# Patient Record
Sex: Female | Born: 1935 | Race: White | Hispanic: No | Smoking: Never smoker
Health system: Southern US, Community
[De-identification: ages and names within clinical notes are randomized; demographics above are authoritative.]

## PROBLEM LIST (undated history)

## (undated) DIAGNOSIS — F419 Anxiety disorder, unspecified: Secondary | ICD-10-CM

## (undated) DIAGNOSIS — U071 COVID-19: Secondary | ICD-10-CM

## (undated) DIAGNOSIS — E079 Disorder of thyroid, unspecified: Secondary | ICD-10-CM

## (undated) DIAGNOSIS — E785 Hyperlipidemia, unspecified: Secondary | ICD-10-CM

## (undated) DIAGNOSIS — N390 Urinary tract infection, site not specified: Secondary | ICD-10-CM

## (undated) DIAGNOSIS — Z87898 Personal history of other specified conditions: Secondary | ICD-10-CM

## (undated) DIAGNOSIS — T7840XA Allergy, unspecified, initial encounter: Secondary | ICD-10-CM

## (undated) DIAGNOSIS — Z8639 Personal history of other endocrine, nutritional and metabolic disease: Secondary | ICD-10-CM

## (undated) DIAGNOSIS — E039 Hypothyroidism, unspecified: Secondary | ICD-10-CM

## (undated) DIAGNOSIS — K635 Polyp of colon: Secondary | ICD-10-CM

## (undated) DIAGNOSIS — M1990 Unspecified osteoarthritis, unspecified site: Secondary | ICD-10-CM

## (undated) DIAGNOSIS — F32A Depression, unspecified: Secondary | ICD-10-CM

## (undated) DIAGNOSIS — G4700 Insomnia, unspecified: Secondary | ICD-10-CM

## (undated) DIAGNOSIS — Z8719 Personal history of other diseases of the digestive system: Secondary | ICD-10-CM

## (undated) DIAGNOSIS — Z8742 Personal history of other diseases of the female genital tract: Secondary | ICD-10-CM

## (undated) DIAGNOSIS — M8580 Other specified disorders of bone density and structure, unspecified site: Secondary | ICD-10-CM

## (undated) DIAGNOSIS — M069 Rheumatoid arthritis, unspecified: Secondary | ICD-10-CM

## (undated) DIAGNOSIS — E538 Deficiency of other specified B group vitamins: Secondary | ICD-10-CM

## (undated) DIAGNOSIS — M722 Plantar fascial fibromatosis: Secondary | ICD-10-CM

## (undated) DIAGNOSIS — F329 Major depressive disorder, single episode, unspecified: Secondary | ICD-10-CM

## (undated) HISTORY — DX: Rheumatoid arthritis, unspecified: M06.9

## (undated) HISTORY — DX: COVID-19: U07.1

## (undated) HISTORY — DX: Disorder of thyroid, unspecified: E07.9

## (undated) HISTORY — DX: Urinary tract infection, site not specified: N39.0

## (undated) HISTORY — DX: Allergy, unspecified, initial encounter: T78.40XA

## (undated) HISTORY — DX: Polyp of colon: K63.5

## (undated) HISTORY — DX: Unspecified osteoarthritis, unspecified site: M19.90

---
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
## 1973-12-06 HISTORY — PX: ABDOMINAL HYSTERECTOMY: SHX81

## 2004-12-06 HISTORY — PX: COLECTOMY: SHX59

## 2005-01-28 ENCOUNTER — Ambulatory Visit: Payer: Self-pay | Admitting: Internal Medicine

## 2005-02-17 ENCOUNTER — Ambulatory Visit: Payer: Self-pay

## 2005-04-19 ENCOUNTER — Inpatient Hospital Stay: Payer: Self-pay | Admitting: Surgery

## 2006-02-22 ENCOUNTER — Ambulatory Visit: Payer: Self-pay | Admitting: Internal Medicine

## 2006-12-06 DIAGNOSIS — K5730 Diverticulosis of large intestine without perforation or abscess without bleeding: Secondary | ICD-10-CM

## 2006-12-06 HISTORY — DX: Diverticulosis of large intestine without perforation or abscess without bleeding: K57.30

## 2007-02-27 ENCOUNTER — Ambulatory Visit: Payer: Self-pay | Admitting: Internal Medicine

## 2007-05-17 ENCOUNTER — Ambulatory Visit: Payer: Self-pay | Admitting: Internal Medicine

## 2007-06-26 ENCOUNTER — Ambulatory Visit: Payer: Self-pay | Admitting: Surgery

## 2007-06-27 ENCOUNTER — Ambulatory Visit: Payer: Self-pay | Admitting: Surgery

## 2007-12-07 HISTORY — PX: CATARACT EXTRACTION: SUR2

## 2008-03-22 ENCOUNTER — Ambulatory Visit: Payer: Self-pay | Admitting: Internal Medicine

## 2009-12-06 HISTORY — PX: KNEE ARTHROSCOPY: SUR90

## 2009-12-06 HISTORY — PX: REPLACEMENT TOTAL KNEE: SUR1224

## 2010-02-09 ENCOUNTER — Inpatient Hospital Stay (HOSPITAL_COMMUNITY): Admission: RE | Admit: 2010-02-09 | Discharge: 2010-02-11 | Payer: Self-pay | Admitting: Orthopedic Surgery

## 2010-03-04 ENCOUNTER — Encounter: Payer: Self-pay | Admitting: Orthopedic Surgery

## 2010-03-06 ENCOUNTER — Encounter: Payer: Self-pay | Admitting: Orthopedic Surgery

## 2010-04-05 ENCOUNTER — Encounter: Payer: Self-pay | Admitting: Orthopedic Surgery

## 2010-05-06 ENCOUNTER — Encounter: Payer: Self-pay | Admitting: Orthopedic Surgery

## 2011-03-01 LAB — URINALYSIS, ROUTINE W REFLEX MICROSCOPIC
Bilirubin Urine: NEGATIVE
Hgb urine dipstick: NEGATIVE
Protein, ur: NEGATIVE mg/dL
Urobilinogen, UA: 0.2 mg/dL (ref 0.0–1.0)

## 2011-03-01 LAB — URINE CULTURE

## 2011-03-01 LAB — CROSSMATCH: Antibody Screen: NEGATIVE

## 2011-03-01 LAB — BASIC METABOLIC PANEL
CO2: 29 mEq/L (ref 19–32)
Calcium: 8.1 mg/dL — ABNORMAL LOW (ref 8.4–10.5)
GFR calc Af Amer: >60 mL/min (ref >60)
GFR calc Af Amer: >60 mL/min (ref >60)
GFR calc non Af Amer: >60 mL/min (ref >60)
GFR calc non Af Amer: >60 mL/min (ref >60)
Glucose, Bld: 107 mg/dL — ABNORMAL HIGH (ref 70–99)
Potassium: 3.8 mEq/L (ref 3.5–5.1)
Sodium: 139 mEq/L (ref 135–145)

## 2011-03-01 LAB — DIFFERENTIAL
Basophils Absolute: 0 10*3/uL (ref 0.0–0.1)
Basophils Relative: 0 % (ref 0–1)
Eosinophils Relative: 4 % (ref 0–5)
Lymphs Abs: 1.7 10*3/uL (ref 0.7–4.0)

## 2011-03-01 LAB — COMPREHENSIVE METABOLIC PANEL
Alkaline Phosphatase: 61 U/L (ref 39–117)
Chloride: 105 mEq/L (ref 96–112)
Creatinine, Ser: 0.86 mg/dL (ref 0.4–1.2)
GFR calc Af Amer: >60 mL/min (ref >60)
Glucose, Bld: 93 mg/dL (ref 70–99)
Potassium: 4.6 mEq/L (ref 3.5–5.1)

## 2011-03-01 LAB — APTT: aPTT: 29 seconds (ref 24–37)

## 2011-03-01 LAB — CBC
HCT: 28.4 % — ABNORMAL LOW (ref 36.0–46.0)
HCT: 42 % (ref 36.0–46.0)
Hemoglobin: 14.6 g/dL (ref 12.0–15.0)
Hemoglobin: 9.8 g/dL — ABNORMAL LOW (ref 12.0–15.0)
MCHC: 34.2 g/dL (ref 30.0–36.0)
MCHC: 34.7 g/dL (ref 30.0–36.0)
Platelets: 245 10*3/uL (ref 150–400)
RBC: 3.11 MIL/uL — ABNORMAL LOW (ref 3.87–5.11)
RDW: 12.3 % (ref 11.5–15.5)

## 2011-03-01 LAB — PROTIME-INR
INR: 0.96 (ref 0.00–1.49)
Prothrombin Time: 12.7 seconds (ref 11.6–15.2)

## 2011-03-01 LAB — ABO/RH: ABO/RH(D): B POS

## 2011-03-01 LAB — URINE MICROSCOPIC-ADD ON

## 2011-03-26 ENCOUNTER — Encounter (HOSPITAL_COMMUNITY)
Admission: RE | Admit: 2011-03-26 | Discharge: 2011-03-26 | Disposition: A | Discharge status: Home / Self Care | Payer: Medicare Other | Source: Ambulatory Visit | Attending: Orthopedic Surgery | Admitting: Orthopedic Surgery

## 2011-03-26 ENCOUNTER — Ambulatory Visit (HOSPITAL_COMMUNITY)
Admission: RE | Admit: 2011-03-26 | Discharge: 2011-03-26 | Disposition: A | Discharge status: Home / Self Care | Payer: Medicare Other | Source: Ambulatory Visit | Attending: Orthopedic Surgery | Admitting: Orthopedic Surgery

## 2011-03-26 ENCOUNTER — Other Ambulatory Visit (HOSPITAL_COMMUNITY): Payer: Self-pay | Admitting: Orthopedic Surgery

## 2011-03-26 DIAGNOSIS — Z01810 Encounter for preprocedural cardiovascular examination: Secondary | ICD-10-CM | POA: Insufficient documentation

## 2011-03-26 DIAGNOSIS — Z01818 Encounter for other preprocedural examination: Secondary | ICD-10-CM | POA: Insufficient documentation

## 2011-03-26 DIAGNOSIS — M1711 Unilateral primary osteoarthritis, right knee: Secondary | ICD-10-CM

## 2011-03-26 DIAGNOSIS — IMO0002 Reserved for concepts with insufficient information to code with codable children: Secondary | ICD-10-CM | POA: Insufficient documentation

## 2011-03-26 DIAGNOSIS — Z01812 Encounter for preprocedural laboratory examination: Secondary | ICD-10-CM | POA: Insufficient documentation

## 2011-03-26 DIAGNOSIS — M1710 Unilateral primary osteoarthritis, unspecified knee: Secondary | ICD-10-CM | POA: Insufficient documentation

## 2011-03-26 LAB — URINALYSIS, ROUTINE W REFLEX MICROSCOPIC
Bilirubin Urine: NEGATIVE
Ketones, ur: NEGATIVE mg/dL
Leukocytes, UA: NEGATIVE
Protein, ur: NEGATIVE mg/dL
Specific Gravity, Urine: 1.016 (ref 1.005–1.030)

## 2011-03-26 LAB — COMPREHENSIVE METABOLIC PANEL
ALT: 40 U/L — ABNORMAL HIGH (ref 0–35)
AST: 37 U/L (ref 0–37)
CO2: 29 mEq/L (ref 19–32)
Calcium: 9.8 mg/dL (ref 8.4–10.5)
Creatinine, Ser: 0.91 mg/dL (ref 0.4–1.2)
GFR calc Af Amer: >60 mL/min (ref >60)
Glucose, Bld: 100 mg/dL — ABNORMAL HIGH (ref 70–99)
Potassium: 4.7 mEq/L (ref 3.5–5.1)
Sodium: 139 mEq/L (ref 135–145)
Total Bilirubin: 0.6 mg/dL (ref 0.3–1.2)

## 2011-03-26 LAB — DIFFERENTIAL
Basophils Absolute: 0 10*3/uL (ref 0.0–0.1)
Eosinophils Absolute: 0.1 10*3/uL (ref 0.0–0.7)
Lymphocytes Relative: 32 % (ref 12–46)
Monocytes Absolute: 0.4 10*3/uL (ref 0.1–1.0)
Monocytes Relative: 8 % (ref 3–12)
Neutro Abs: 2.7 10*3/uL (ref 1.7–7.7)

## 2011-03-26 LAB — URINE MICROSCOPIC-ADD ON

## 2011-03-26 LAB — APTT: aPTT: 24 seconds (ref 24–37)

## 2011-03-26 LAB — CBC
HCT: 38.5 % (ref 36.0–46.0)
MCH: 29.3 pg (ref 26.0–34.0)
RBC: 4.5 MIL/uL (ref 3.87–5.11)
WBC: 4.8 10*3/uL (ref 4.0–10.5)

## 2011-03-26 LAB — SURGICAL PCR SCREEN: Staphylococcus aureus: POSITIVE — AB

## 2011-03-27 LAB — URINE CULTURE: Culture: NO GROWTH

## 2011-03-29 ENCOUNTER — Inpatient Hospital Stay (HOSPITAL_COMMUNITY)
Admission: RE | Admit: 2011-03-29 | Discharge: 2011-03-31 | DRG: 488 | Disposition: A | Discharge status: Home Health | Payer: Medicare Other | Source: Ambulatory Visit | Attending: Orthopedic Surgery | Admitting: Orthopedic Surgery

## 2011-03-29 DIAGNOSIS — Y831 Surgical operation with implant of artificial internal device as the cause of abnormal reaction of the patient, or of later complication, without mention of misadventure at the time of the procedure: Secondary | ICD-10-CM | POA: Diagnosis present

## 2011-03-29 DIAGNOSIS — Z79899 Other long term (current) drug therapy: Secondary | ICD-10-CM

## 2011-03-29 DIAGNOSIS — M12269 Villonodular synovitis (pigmented), unspecified knee: Secondary | ICD-10-CM | POA: Diagnosis present

## 2011-03-29 DIAGNOSIS — D62 Acute posthemorrhagic anemia: Secondary | ICD-10-CM | POA: Diagnosis not present

## 2011-03-29 DIAGNOSIS — Z96659 Presence of unspecified artificial knee joint: Secondary | ICD-10-CM

## 2011-03-29 DIAGNOSIS — T8489XA Other specified complication of internal orthopedic prosthetic devices, implants and grafts, initial encounter: Principal | ICD-10-CM | POA: Diagnosis present

## 2011-03-29 DIAGNOSIS — M25069 Hemarthrosis, unspecified knee: Secondary | ICD-10-CM | POA: Diagnosis present

## 2011-03-30 LAB — CBC
HCT: 31 % — ABNORMAL LOW (ref 36.0–46.0)
Hemoglobin: 10.4 g/dL — ABNORMAL LOW (ref 12.0–15.0)
MCH: 29.5 pg (ref 26.0–34.0)
MCHC: 33.5 g/dL (ref 30.0–36.0)

## 2011-03-30 LAB — BASIC METABOLIC PANEL
BUN: 7 mg/dL (ref 6–23)
CO2: 28 mEq/L (ref 19–32)
Calcium: 7.9 mg/dL — ABNORMAL LOW (ref 8.4–10.5)
Creatinine, Ser: 0.8 mg/dL (ref 0.4–1.2)
Glucose, Bld: 105 mg/dL — ABNORMAL HIGH (ref 70–99)

## 2011-03-31 LAB — BASIC METABOLIC PANEL
CO2: 26 mEq/L (ref 19–32)
Calcium: 8.3 mg/dL — ABNORMAL LOW (ref 8.4–10.5)
Creatinine, Ser: 0.73 mg/dL (ref 0.4–1.2)
GFR calc Af Amer: >60 mL/min (ref >60)

## 2011-03-31 LAB — CBC
MCH: 29.6 pg (ref 26.0–34.0)
MCHC: 33.9 g/dL (ref 30.0–36.0)
Platelets: 197 10*3/uL (ref 150–400)

## 2011-03-31 NOTE — Op Note (Signed)
  NAME:  PULLEYMillie, Forde              ACCOUNT NO.:  [id number]  MEDICAL RECORD NO.:  [id number]           PATIENT TYPE:  LOCATION:                                 FACILITY:  PHYSICIAN:  Mila Homer. Sherlean Foot, M.D. DATE OF BIRTH:  Feb 10, 1936  DATE OF PROCEDURE: DATE OF DISCHARGE:                              OPERATIVE REPORT   PREOPERATIVE DIAGNOSIS:  Pigmented villonodular synovitis and recurrent hemarthrosis.  POSTOPERATIVE DIAGNOSIS:  Pigmented villonodular synovitis and recurrent hemarthrosis.  PROCEDURE:  Right knee revision (one side) with complete open synovectomy.  INDICATIONS FOR PROCEDURE:  The patient is a 75 year old who is approximately one year out from a total knee replacement.  He had recurrent effusions, so I did an  arthroscopic biopsy of the synovium confirming PVNS.  Consent was obtained for open synovectomy.  DESCRIPTION OF PROCEDURE:  The patient was laid supine, administered general anesthesia.  Right leg was prepped and draped in the usual sterile fashion.  Extremity was exsanguinated with the Esmarch and tourniquet inflated to 350 mmHg and set for an hour.  A midline incision was made with a #10 blade.  New blade was used to make a median parapatellar arthrotomy and performed a synovectomy.  I did this synovectomy with multiple clean #10 blade and all the way to the far recess of the knee, curetted out behind the knee, and at that point it looked like blood stained tissue that was pathologic.  I had removed the polyethylene to get to the back of the knee.  I then irrigated it copiously, I let the tourniquet down, obtained hemostasis.  I did put in some thrombin gel spray just to coat the back and the synovium or raw surfaces and the tourniquet down.  I closed with figure-of-eight #1 Vicryl sutures, buried 0 Vicryl sutures, subcuticular 2-0 Vicryl stitch, and skin staples.  COMPLICATIONS:  None.  DRAINS:  1 Hemovac.  ESTIMATED BLOOD LOSS:  300  mL.          ______________________________ Mila Homer. Sherlean Foot, M.D.     SDL/MEDQ  D:  03/29/2011  T:  03/29/2011  Job:  [id number]  Electronically Signed by Georgena Spurling M.D. on 03/31/2011 09:27:04 AM

## 2011-04-01 LAB — CROSSMATCH

## 2011-04-20 ENCOUNTER — Encounter: Payer: Self-pay | Admitting: Orthopedic Surgery

## 2011-05-07 ENCOUNTER — Encounter: Payer: Self-pay | Admitting: Orthopedic Surgery

## 2011-06-06 ENCOUNTER — Encounter: Payer: Self-pay | Admitting: Orthopedic Surgery

## 2012-12-06 DIAGNOSIS — Z8619 Personal history of other infectious and parasitic diseases: Secondary | ICD-10-CM

## 2012-12-06 HISTORY — DX: Personal history of other infectious and parasitic diseases: Z86.19

## 2013-01-02 ENCOUNTER — Ambulatory Visit: Payer: Self-pay | Admitting: Gastroenterology

## 2013-02-06 ENCOUNTER — Other Ambulatory Visit: Payer: Self-pay

## 2013-02-12 LAB — CULTURE, BLOOD (SINGLE)

## 2013-12-06 HISTORY — PX: COLONOSCOPY: SHX174

## 2014-10-18 ENCOUNTER — Ambulatory Visit: Payer: Self-pay | Admitting: Internal Medicine

## 2014-11-14 ENCOUNTER — Encounter: Payer: Self-pay | Admitting: Family Medicine

## 2014-11-14 ENCOUNTER — Ambulatory Visit (INDEPENDENT_AMBULATORY_CARE_PROVIDER_SITE_OTHER): Payer: Medicare HMO | Admitting: Family Medicine

## 2014-11-14 ENCOUNTER — Encounter (INDEPENDENT_AMBULATORY_CARE_PROVIDER_SITE_OTHER): Payer: Self-pay

## 2014-11-14 VITALS — BP 120/74 | HR 73 | Temp 98.3°F | Ht 66.0 in | Wt 163.5 lb

## 2014-11-14 DIAGNOSIS — J069 Acute upper respiratory infection, unspecified: Secondary | ICD-10-CM | POA: Insufficient documentation

## 2014-11-14 DIAGNOSIS — E039 Hypothyroidism, unspecified: Secondary | ICD-10-CM | POA: Insufficient documentation

## 2014-11-14 DIAGNOSIS — R1903 Right lower quadrant abdominal swelling, mass and lump: Secondary | ICD-10-CM | POA: Insufficient documentation

## 2014-11-14 DIAGNOSIS — E049 Nontoxic goiter, unspecified: Secondary | ICD-10-CM | POA: Insufficient documentation

## 2014-11-14 NOTE — Patient Instructions (Signed)
It was lovely to meet you. Please schedule a wellness visit at your convenience--give me a few weeks at least to get your records.

## 2014-11-14 NOTE — Assessment & Plan Note (Signed)
Per pt, has been stable on current rx. Awaiting records.

## 2014-11-14 NOTE — Progress Notes (Signed)
Pre visit review using our clinic review tool, if applicable. No additional management support is needed unless otherwise documented below in the visit note. 

## 2014-11-14 NOTE — Progress Notes (Signed)
Subjective:   Patient ID: Lisa Petersen, female    DOB: 06/05/1936, 78 y.o.   MRN: [id number]  Lisa Petersen is a pleasant 78 y.o. year old female who presents to clinic today with [place] and Cough  on 11/14/2014  HPI: RLQ subcutaneous mass- went to PCP earlier this month complaining of right subcutaneous firm mass.  CT of abdomen was done on 10/18/14- she brings the report with her today- no acute findings.  Advised to repeat in 1 year per PCP, not radiologist.  Never been painful.  She does not think it is growing in size.  Not associated with changes in bowel habits. Never red.  Nothing seems to make it smaller or bigger.  Had a URI for over a month.  Went to UC on 12/8 and diagnosed with sinusitis and given zpack, tessalon and flonase.  Still taking Zpack.  Does feel better.  Has been afebrile and cough improving.  Tessalon not very effective but OTC Delsym is.  Hypothyroidism- has been stable on current dose of synthroid for years.  Denies any symptoms of hypo or hyperthyroidism.  Per pt, UTD on prevention.  Last preventative exam was in July or August of this year.  No current outpatient prescriptions on file prior to visit.   No current facility-administered medications on file prior to visit.    Allergies  Allergen Reactions  . Cephalexin Rash  . Sulfa Antibiotics Rash    Past Medical History  Diagnosis Date  . Arthritis   . Allergy   . Thyroid disease   . UTI (lower urinary tract infection)   . Colon polyps     Past Surgical History  Procedure Laterality Date  . Tonsillectomy and adenoidectomy  1950  . Abdominal hysterectomy  1975  . Colectomy Right 2006  . Replacement total knee Right 2011  . Cataract extraction Bilateral 2009    Family History  Problem Relation Age of Onset  . Hypertension Mother   . Hypertension Sister   . Mental illness Sister   . Diabetes Brother   . Cancer Brother     History   Social History  . Marital Status:  Married    Spouse Name: N/A    Number of Children: N/A  . Years of Education: N/A   Occupational History  . Not on file.   Social History Main Topics  . Smoking status: Never Smoker   . Smokeless tobacco: Never Used  . Alcohol Use: No  . Drug Use: No  . Sexual Activity: Yes    Birth Control/ Protection: None   Other Topics Concern  . Not on file   Social History Narrative  . No narrative on file   The PMH, PSH, Social History, Family History, Medications, and allergies have been reviewed in [facility], and have been updated if relevant.   Review of Systems  Constitutional: Negative for fever, chills, diaphoresis, activity change, appetite change, fatigue and unexpected weight change.  HENT: Positive for congestion, postnasal drip, rhinorrhea and sinus pressure. Negative for ear pain, facial swelling, nosebleeds, sneezing, sore throat, trouble swallowing and voice change.   Eyes: Negative.   Respiratory: Positive for cough. Negative for apnea, choking, chest tightness, shortness of breath, wheezing and stridor.   Cardiovascular: Negative.   Gastrointestinal: Negative.   Endocrine: Negative.   Genitourinary: Negative.   Musculoskeletal: Negative.   Skin: Negative.   Allergic/Immunologic: Negative.   Neurological: Negative.   Hematological: Negative.   Psychiatric/Behavioral: Negative.   All  other systems reviewed and are negative.      Objective:    BP 120/74 mmHg  Pulse 73  Temp(Src) 98.3 F (36.8 C) (Oral)  Ht 5\' 6"  (1.676 m)  Wt 163 lb 8 oz (74.163 kg)  BMI 26.40 kg/m2  SpO2 94%   Physical Exam  Constitutional: She is oriented to person, place, and time. She appears well-developed and well-nourished. No distress.  HENT:  Head: Normocephalic and atraumatic.  Right Ear: External ear normal.  Left Ear: External ear normal.  Nose: No mucosal edema or rhinorrhea. Right sinus exhibits no maxillary sinus tenderness and no frontal sinus tenderness. Left sinus exhibits  no maxillary sinus tenderness and no frontal sinus tenderness.  Mouth/Throat: No oropharyngeal exudate.  Cardiovascular: Normal rate and regular rhythm.   Pulmonary/Chest: Effort normal and breath sounds normal.  Abdominal: Soft. She exhibits no distension and no mass. There is no tenderness. There is no rebound and no guarding.  approx 1 cm, round, firm, freely movable mass right inguinal area, NTTP  Musculoskeletal: She exhibits no edema.  Neurological: She is alert and oriented to person, place, and time.  Skin: Skin is warm and dry.  Psychiatric: She has a normal mood and affect. Her behavior is normal. Judgment and thought content normal.  Nursing note and vitals reviewed.         Assessment & Plan:   Hypothyroidism, unspecified hypothyroidism type  Protracted upper respiratory infection  Right lower quadrant abdominal mass No Follow-up on file.

## 2014-11-14 NOTE — Assessment & Plan Note (Signed)
On abx. Symptoms improving. Call or return to clinic prn if these symptoms worsen or fail to improve as anticipated. The patient indicates understanding of these issues and agrees with the plan.

## 2014-11-14 NOTE — Assessment & Plan Note (Signed)
Discussed CT scan findings and my physical findings with Lisa Petersen. I feel this is most likely a subcutaneous cyst that is likely benign and I do not feel this warrants a repeat CT scan in one year unless it worsens/changes in any way. She will keep me updated. The patient indicates understanding of these issues and agrees with the plan.

## 2014-11-20 ENCOUNTER — Encounter: Payer: Self-pay | Admitting: Family Medicine

## 2015-01-21 ENCOUNTER — Other Ambulatory Visit: Payer: Self-pay

## 2015-01-21 MED ORDER — LEVOTHYROXINE SODIUM 112 MCG PO TABS: 112.0000 ug | ORAL_TABLET | Freq: Every day | ORAL | Status: DC

## 2015-01-21 NOTE — Telephone Encounter (Signed)
I would await for records or have her come in for thyroid labs before filling

## 2015-01-21 NOTE — Telephone Encounter (Signed)
Per Care Everywhere TSH was done within [facility] in 7/15, and it was within normal range. Pt has a cpx scheduled in 7/16. I'll refill until then.

## 2015-01-21 NOTE — Telephone Encounter (Signed)
Pt left note requesting 90 day refill levothyroxine [facility]. Pt seen to establish care on 11/14/14. In chart under hypothyroidism states awaiting records. Is it OK to fill?

## 2015-03-05 ENCOUNTER — Telehealth: Payer: Self-pay | Admitting: Family Medicine

## 2015-03-05 NOTE — Telephone Encounter (Signed)
Chart updated to reflect 

## 2015-03-05 NOTE — Telephone Encounter (Signed)
Pt called back stating [facility]

## 2015-05-09 DIAGNOSIS — L570 Actinic keratosis: Secondary | ICD-10-CM | POA: Diagnosis not present

## 2015-05-09 DIAGNOSIS — Z85828 Personal history of other malignant neoplasm of skin: Secondary | ICD-10-CM | POA: Diagnosis not present

## 2015-05-09 DIAGNOSIS — L538 Other specified erythematous conditions: Secondary | ICD-10-CM | POA: Diagnosis not present

## 2015-05-09 DIAGNOSIS — L820 Inflamed seborrheic keratosis: Secondary | ICD-10-CM | POA: Diagnosis not present

## 2015-06-12 ENCOUNTER — Other Ambulatory Visit: Payer: Self-pay | Admitting: Family Medicine

## 2015-06-12 ENCOUNTER — Other Ambulatory Visit (INDEPENDENT_AMBULATORY_CARE_PROVIDER_SITE_OTHER): Payer: Medicare Other

## 2015-06-12 DIAGNOSIS — E039 Hypothyroidism, unspecified: Secondary | ICD-10-CM

## 2015-06-12 DIAGNOSIS — Z01419 Encounter for gynecological examination (general) (routine) without abnormal findings: Secondary | ICD-10-CM

## 2015-06-12 DIAGNOSIS — Z0000 Encounter for general adult medical examination without abnormal findings: Secondary | ICD-10-CM

## 2015-06-12 LAB — CBC WITH DIFFERENTIAL/PLATELET
BASOS ABS: 0 10*3/uL (ref 0.0–0.1)
Basophils Relative: 0.4 % (ref 0.0–3.0)
EOS ABS: 0.1 10*3/uL (ref 0.0–0.7)
Eosinophils Relative: 3.1 % (ref 0.0–5.0)
HCT: 40.8 % (ref 36.0–46.0)
Hemoglobin: 13.6 g/dL (ref 12.0–15.0)
LYMPHS ABS: 1.3 10*3/uL (ref 0.7–4.0)
LYMPHS PCT: 27.2 % (ref 12.0–46.0)
MCHC: 33.4 g/dL (ref 30.0–36.0)
MCV: 89.8 fl (ref 78.0–100.0)
MONO ABS: 0.3 10*3/uL (ref 0.1–1.0)
Monocytes Relative: 6.6 % (ref 3.0–12.0)
NEUTROS ABS: 3.1 10*3/uL (ref 1.4–7.7)
Neutrophils Relative %: 62.7 % (ref 43.0–77.0)
Platelets: 222 10*3/uL (ref 150.0–400.0)
RBC: 4.54 Mil/uL (ref 3.87–5.11)
RDW: 13.2 % (ref 11.5–15.5)
WBC: 4.9 10*3/uL (ref 4.0–10.5)

## 2015-06-12 LAB — COMPREHENSIVE METABOLIC PANEL
ALBUMIN: 4 g/dL (ref 3.5–5.2)
ALT: 18 U/L (ref 0–35)
AST: 19 U/L (ref 0–37)
Alkaline Phosphatase: 53 U/L (ref 39–117)
BILIRUBIN TOTAL: 0.4 mg/dL (ref 0.2–1.2)
BUN: 10 mg/dL (ref 6–23)
CHLORIDE: 107 meq/L (ref 96–112)
CO2: 27 meq/L (ref 19–32)
Calcium: 9.3 mg/dL (ref 8.4–10.5)
Creatinine, Ser: 0.87 mg/dL (ref 0.40–1.20)
GFR: 67.13 mL/min (ref >60.00)
GLUCOSE: 95 mg/dL (ref 70–99)
POTASSIUM: 4.6 meq/L (ref 3.5–5.1)
SODIUM: 140 meq/L (ref 135–145)
TOTAL PROTEIN: 6.3 g/dL (ref 6.0–8.3)

## 2015-06-12 LAB — LIPID PANEL
CHOL/HDL RATIO: 5
Cholesterol: 199 mg/dL (ref 0–200)
HDL: 39.8 mg/dL (ref >39.00)
LDL CALC: 123 mg/dL — AB (ref 0–99)
NONHDL: 159.2
Triglycerides: 179 mg/dL — ABNORMAL HIGH (ref 0.0–149.0)
VLDL: 35.8 mg/dL (ref 0.0–40.0)

## 2015-06-12 LAB — TSH: TSH: 0.92 u[IU]/mL (ref 0.35–4.50)

## 2015-06-12 LAB — T4, FREE: Free T4: 0.95 ng/dL (ref 0.60–1.60)

## 2015-06-17 ENCOUNTER — Encounter: Payer: Self-pay | Admitting: Family Medicine

## 2015-06-17 ENCOUNTER — Ambulatory Visit (INDEPENDENT_AMBULATORY_CARE_PROVIDER_SITE_OTHER): Payer: Medicare Other | Admitting: Family Medicine

## 2015-06-17 VITALS — BP 142/82 | HR 66 | Temp 98.0°F | Ht 66.0 in | Wt 167.5 lb

## 2015-06-17 DIAGNOSIS — Z23 Encounter for immunization: Secondary | ICD-10-CM

## 2015-06-17 DIAGNOSIS — Z0000 Encounter for general adult medical examination without abnormal findings: Secondary | ICD-10-CM | POA: Diagnosis not present

## 2015-06-17 DIAGNOSIS — F4321 Adjustment disorder with depressed mood: Secondary | ICD-10-CM | POA: Insufficient documentation

## 2015-06-17 DIAGNOSIS — E039 Hypothyroidism, unspecified: Secondary | ICD-10-CM | POA: Diagnosis not present

## 2015-06-17 DIAGNOSIS — G4700 Insomnia, unspecified: Secondary | ICD-10-CM | POA: Insufficient documentation

## 2015-06-17 MED ORDER — ZOLPIDEM TARTRATE 5 MG PO TABS: 5.0000 mg | ORAL_TABLET | Freq: Every evening | ORAL | Status: DC | PRN

## 2015-06-17 NOTE — Assessment & Plan Note (Signed)
The patients weight, height, BMI and visual acuity have been recorded in the chart.  Cognitive function assessed.   I have made referrals, counseling and provided education to the patient based review of the above and I have provided the pt with a written personalized care plan for preventive services.  Prevnar 13 given today. She will call to set up her mammogram.

## 2015-06-17 NOTE — Addendum Note (Signed)
Addended by: Modena Nunnery on: 06/17/2015 09:26 AM   Modules accepted: Orders

## 2015-06-17 NOTE — Assessment & Plan Note (Signed)
Appropriately grieving.  Offered my condolences and support.

## 2015-06-17 NOTE — Progress Notes (Signed)
Subjective:   Patient ID: Lisa Petersen, female    DOB: 1936-10-15, 79 y.o.   MRN: [id number]  Lisa Petersen is a pleasant 79 y.o. year old female who presents to clinic today with Annual Exam and to discuss insomnia on 06/17/2015  HPI:  I have not seen her since she established care with me in 11/2014.  I have personally reviewed the Medicare Annual Wellness questionnaire and have noted 1. The patient's medical and social history 2. Their use of alcohol, tobacco or illicit drugs 3. Their current medications and supplements 4. The patient's functional ability including ADL's, fall risks, home safety risks and hearing or visual             impairment. 5. Diet and physical activities 6. Evidence for depression or mood disorders  End of life wishes discussed and updated in Social History.  The roster of all physicians providing medical care to patient - is listed in the CareTeams section of the chart.  Eye exam scheduled for 06/26/15- Dr. George Ina Skin exam- Dr. Evorn Gong- 6/31/16 Remote h/o hysterectomy Td 12/05/06 Colonoscopy 01/02/13 Zoster 12/21/07 Pneumovax 05/29/09   Hypothyroidism- has been stable on current dose of synthroid for years.  Denies any symptoms of hypo or hyperthyroidism.  Lab Results  Component Value Date   TSH 0.92 06/12/2015   Insomnia- chronic issue but worse since her brother died last week.  He was 90 and this was somewhat expected but she is still sad.  She is happy that she was there with him when he died.  Appetite is good.  No SI or HI. Has tried melatonin and it has not helped.  Asking to try [place] like she did in past for short period of time.  Lab Results  Component Value Date   WBC 4.9 06/12/2015   HGB 13.6 06/12/2015   HCT 40.8 06/12/2015   MCV 89.8 06/12/2015   PLT 222.0 06/12/2015   Lab Results  Component Value Date   CHOL 199 06/12/2015   HDL 39.80 06/12/2015   LDLCALC 123* 06/12/2015   TRIG 179.0* 06/12/2015   CHOLHDL 5  06/12/2015   Lab Results  Component Value Date   NA 140 06/12/2015   K 4.6 06/12/2015   CL 107 06/12/2015   CO2 27 06/12/2015   Lab Results  Component Value Date   CREATININE 0.87 06/12/2015    Current Outpatient Prescriptions on File Prior to Visit  Medication Sig Dispense Refill  . Calcium Carbonate-Vitamin D (CALTRATE 600+D PO) Take by mouth daily.    Marland Kitchen levothyroxine (SYNTHROID, LEVOTHROID) 112 MCG tablet Take 1 tablet (112 mcg total) by mouth daily before breakfast. 90 tablet 1  . Misc Natural Products (OSTEO BI-FLEX JOINT SHIELD PO) Take 2 tablets by mouth daily.    . Multiple Vitamins-Minerals (CENTRUM SILVER PO) Take by mouth daily.    . vitamin B-12 (CYANOCOBALAMIN) 1000 MCG tablet Take 1,000 mcg by mouth daily.     No current facility-administered medications on file prior to visit.    Allergies  Allergen Reactions  . Cephalexin Rash  . Sulfa Antibiotics Rash    Past Medical History  Diagnosis Date  . Arthritis   . Allergy   . Thyroid disease   . UTI (lower urinary tract infection)   . Colon polyps     Past Surgical History  Procedure Laterality Date  . Tonsillectomy and adenoidectomy  1950  . Abdominal hysterectomy  1975  . Colectomy Right 2006  . Replacement total  knee Right 2011  . Cataract extraction Bilateral 2009    Family History  Problem Relation Age of Onset  . Hypertension Mother   . Hypertension Sister   . Mental illness Sister   . Diabetes Brother   . Cancer Brother     History   Social History  . Marital Status: Married    Spouse Name: N/A  . Number of Children: N/A  . Years of Education: N/A   Occupational History  . Not on file.   Social History Main Topics  . Smoking status: Never Smoker   . Smokeless tobacco: Never Used  . Alcohol Use: No  . Drug Use: No  . Sexual Activity: Yes    Birth Control/ Protection: None   Other Topics Concern  . Not on file   Social History Narrative   The PMH, PSH, Social History,  Family History, Medications, and allergies have been reviewed in [facility], and have been updated if relevant.   Review of Systems  Constitutional: Negative.  Negative for fever, chills, diaphoresis, activity change, appetite change, fatigue and unexpected weight change.  HENT: Negative for congestion, ear pain, facial swelling, nosebleeds, postnasal drip, rhinorrhea, sinus pressure, sneezing, sore throat, trouble swallowing and voice change.   Eyes: Negative.   Respiratory: Positive for cough. Negative for apnea, choking, chest tightness, shortness of breath, wheezing and stridor.   Cardiovascular: Negative.   Gastrointestinal: Negative.   Endocrine: Negative.   Genitourinary: Negative.   Musculoskeletal: Negative.   Skin: Negative.   Allergic/Immunologic: Negative.   Neurological: Negative.   Hematological: Negative.   Psychiatric/Behavioral: Positive for sleep disturbance. Negative for hallucinations, behavioral problems, confusion, dysphoric mood and decreased concentration. The patient is not nervous/anxious and is not hyperactive.   All other systems reviewed and are negative.      Objective:    BP 142/82 mmHg  Pulse 66  Temp(Src) 98 F (36.7 C) (Oral)  Ht 5\' 6"  (1.676 m)  Wt 167 lb 8 oz (75.978 kg)  BMI 27.05 kg/m2  SpO2 97%   Physical Exam  Constitutional: She is oriented to person, place, and time. She appears well-developed and well-nourished. No distress.  HENT:  Head: Normocephalic and atraumatic.  Right Ear: External ear normal.  Left Ear: External ear normal.  Nose: No mucosal edema or rhinorrhea. Right sinus exhibits no maxillary sinus tenderness and no frontal sinus tenderness. Left sinus exhibits no maxillary sinus tenderness and no frontal sinus tenderness.  Mouth/Throat: No oropharyngeal exudate.  Cardiovascular: Normal rate and regular rhythm.   Pulmonary/Chest: Effort normal and breath sounds normal. Right breast exhibits no inverted nipple, no mass, no  nipple discharge, no skin change and no tenderness. Left breast exhibits no inverted nipple, no mass, no nipple discharge, no skin change and no tenderness. Breasts are symmetrical.  Abdominal: Soft. She exhibits no distension and no mass. There is no tenderness. There is no rebound and no guarding.  Musculoskeletal: She exhibits no edema.  Neurological: She is alert and oriented to person, place, and time.  Skin: Skin is warm and dry.  Psychiatric: She has a normal mood and affect. Her behavior is normal. Judgment and thought content normal.  Nursing note and vitals reviewed.         Assessment & Plan:   Hypothyroidism, unspecified hypothyroidism type  Medicare annual wellness visit, subsequent No Follow-up on file.

## 2015-06-17 NOTE — Progress Notes (Signed)
Pre visit review using our clinic review tool, if applicable. No additional management support is needed unless otherwise documented below in the visit note. 

## 2015-06-17 NOTE — Patient Instructions (Addendum)
Good to see you. I am sorry for your loss.  Try tylenol PM.  If that does not work, try an [place].

## 2015-06-17 NOTE — Assessment & Plan Note (Addendum)
Total time spent with patient was 65 minutes, with 25 minutes spent specifically on problem visit concerning Insomnia. Greater than 50 percent of the 25 minutes was spent in counseling the Insomnia.   Avoidance of caffeine sources is strongly encouraged. Sleep hygiene issues are reviewed. The use of sedative hypnotics for temporary relief is appropriate; we discussed the addictive nature of these drugs, and a one-time only prescription for prn use of a hypnotic is given, to use no more than 3 times per week for 2-3 weeks.

## 2015-06-17 NOTE — Assessment & Plan Note (Signed)
Well controlled on current dose of synthroid. No changes made. 

## 2015-06-25 DIAGNOSIS — R928 Other abnormal and inconclusive findings on diagnostic imaging of breast: Secondary | ICD-10-CM | POA: Diagnosis not present

## 2015-06-25 DIAGNOSIS — Z1231 Encounter for screening mammogram for malignant neoplasm of breast: Secondary | ICD-10-CM | POA: Diagnosis not present

## 2015-06-26 ENCOUNTER — Encounter: Payer: Self-pay | Admitting: Family Medicine

## 2015-06-26 DIAGNOSIS — H43813 Vitreous degeneration, bilateral: Secondary | ICD-10-CM | POA: Diagnosis not present

## 2015-07-10 ENCOUNTER — Ambulatory Visit (INDEPENDENT_AMBULATORY_CARE_PROVIDER_SITE_OTHER): Payer: Medicare Other | Admitting: Primary Care

## 2015-07-10 ENCOUNTER — Encounter: Payer: Self-pay | Admitting: Primary Care

## 2015-07-10 VITALS — BP 132/82 | HR 69 | Temp 98.2°F | Ht 66.0 in | Wt 166.1 lb

## 2015-07-10 DIAGNOSIS — L237 Allergic contact dermatitis due to plants, except food: Secondary | ICD-10-CM | POA: Diagnosis not present

## 2015-07-10 MED ORDER — TRIAMCINOLONE ACETONIDE 0.1 % EX CREA: 1.0000 "application " | TOPICAL_CREAM | Freq: Two times a day (BID) | CUTANEOUS | Status: DC

## 2015-07-10 MED ORDER — PREDNISONE 20 MG PO TABS: ORAL_TABLET | ORAL | Status: DC

## 2015-07-10 NOTE — Progress Notes (Signed)
Pre visit review using our clinic review tool, if applicable. No additional management support is needed unless otherwise documented below in the visit note. 

## 2015-07-10 NOTE — Patient Instructions (Signed)
Start Prednisone tablets. Take 2 tablets by mouth daily for 7 days, then take 1 tablet by mouth daily for 7 days.  You may apply the triamcinolone cream twice daily to your chest for itching.  Please notify us if you do not have resolve of symptoms in the next 2 weeks.  It was a pleasure meeting you!

## 2015-07-10 NOTE — Progress Notes (Signed)
Subjective:    Patient ID: Lisa Petersen, female    DOB: 1936-04-24, 79 y.o.   MRN: [id number]  HPI  Lisa Petersen is a 79 year old female who presents today with a chief complaint of skin rash and irritation. She first noticed her rash 3-4 days ago since being outside in the yard. Her rash is present to her anterior chest and has one blister to her right 2nd digit. She reports itching and tenderness. She's tried calamine lotion, hydrocortisone cream, and two other OTC products with temporarily relief. She believes that her rash is becoming worse overall.   Review of Systems  Constitutional: Negative for fever.  Respiratory: Negative for shortness of breath.   Cardiovascular: Negative for chest pain.  Skin: Positive for color change and rash.       Itching.       Past Medical History  Diagnosis Date  . Arthritis   . Allergy   . Thyroid disease   . UTI (lower urinary tract infection)   . Colon polyps     History   Social History  . Marital Status: Married    Spouse Name: N/A  . Number of Children: N/A  . Years of Education: N/A   Occupational History  . Not on file.   Social History Main Topics  . Smoking status: Never Smoker   . Smokeless tobacco: Never Used  . Alcohol Use: No  . Drug Use: No  . Sexual Activity: Yes    Birth Control/ Protection: None   Other Topics Concern  . Not on file   Social History Narrative   Has a living will scanned into chart.    Past Surgical History  Procedure Laterality Date  . Tonsillectomy and adenoidectomy  1950  . Abdominal hysterectomy  1975  . Colectomy Right 2006  . Replacement total knee Right 2011  . Cataract extraction Bilateral 2009    Family History  Problem Relation Age of Onset  . Hypertension Mother   . Hypertension Sister   . Mental illness Sister   . Diabetes Brother   . Cancer Brother     Allergies  Allergen Reactions  . Cephalexin Rash  . Sulfa Antibiotics Rash    Current Outpatient  Prescriptions on File Prior to Visit  Medication Sig Dispense Refill  . Calcium Carbonate-Vitamin D (CALTRATE 600+D PO) Take by mouth daily.    Marland Kitchen levothyroxine (SYNTHROID, LEVOTHROID) 112 MCG tablet Take 1 tablet (112 mcg total) by mouth daily before breakfast. 90 tablet 1  . Misc Natural Products (OSTEO BI-FLEX JOINT SHIELD PO) Take 2 tablets by mouth daily.    . Multiple Vitamins-Minerals (CENTRUM SILVER PO) Take by mouth daily.    . vitamin B-12 (CYANOCOBALAMIN) 1000 MCG tablet Take 1,000 mcg by mouth daily.    Marland Kitchen zolpidem (AMBIEN) 5 MG tablet Take 1 tablet (5 mg total) by mouth at bedtime as needed for sleep. (Patient not taking: Reported on 07/10/2015) 15 tablet 1   No current facility-administered medications on file prior to visit.    BP 132/82 mmHg  Pulse 69  Temp(Src) 98.2 F (36.8 C) (Oral)  Ht 5\' 6"  (1.676 m)  Wt 166 lb 1.9 oz (75.352 kg)  BMI 26.83 kg/m2  SpO2 95%    Objective:   Physical Exam  Constitutional: She appears well-nourished.  Cardiovascular: Normal rate and regular rhythm.   Pulmonary/Chest: Effort normal and breath sounds normal.  Skin: Skin is warm and dry. Rash noted.  6 cm rash  present to anterior chest representing poison ivy/oak dermatitis. 1 small blister to right 2nd digit.          Assessment & Plan:  Poison Ivy/Oak Dermatitis:  Rash present to anterior chest, 6 cm in diameter. Itching, burning. RX for prednisone taper over 2 weeks and triamcinolone cream for itching. She is to follow up if no improvement in 2 weeks.

## 2015-08-25 ENCOUNTER — Other Ambulatory Visit: Payer: Self-pay | Admitting: Family Medicine

## 2015-09-20 ENCOUNTER — Other Ambulatory Visit: Payer: Self-pay | Admitting: Family Medicine

## 2015-09-22 NOTE — Telephone Encounter (Signed)
Last f/u appt 06/2015 

## 2015-09-22 NOTE — Telephone Encounter (Signed)
rx called in to requested pharmacy 

## 2015-11-12 ENCOUNTER — Ambulatory Visit (INDEPENDENT_AMBULATORY_CARE_PROVIDER_SITE_OTHER): Payer: Medicare Other | Admitting: Family Medicine

## 2015-11-12 ENCOUNTER — Encounter: Payer: Self-pay | Admitting: Family Medicine

## 2015-11-12 VITALS — BP 122/78 | HR 72 | Temp 98.5°F | Wt 167.2 lb

## 2015-11-12 DIAGNOSIS — R42 Dizziness and giddiness: Secondary | ICD-10-CM

## 2015-11-12 DIAGNOSIS — H6121 Impacted cerumen, right ear: Secondary | ICD-10-CM

## 2015-11-12 DIAGNOSIS — H6120 Impacted cerumen, unspecified ear: Secondary | ICD-10-CM | POA: Insufficient documentation

## 2015-11-12 MED ORDER — MECLIZINE HCL 12.5 MG PO TABS: 12.5000 mg | ORAL_TABLET | Freq: Three times a day (TID) | ORAL | Status: DC | PRN

## 2015-11-12 NOTE — Assessment & Plan Note (Signed)
New- consistent with BPV. Cerumen removed as likely aggrevating factor. eRx sent for meclizine. If symptoms persist, send to ENT for Epley vs vestibular rehab. The patient indicates understanding of these issues and agrees with the plan.

## 2015-11-12 NOTE — Patient Instructions (Addendum)
Benign Positional Vertigo Vertigo is the feeling that you or your surroundings are moving when they are not. Benign positional vertigo is the most common form of vertigo. The cause of this condition is not serious (is benign). This condition is triggered by certain movements and positions (is positional). This condition can be dangerous if it occurs while you are doing something that could endanger you or others, such as driving.  CAUSES In many cases, the cause of this condition is not known. It may be caused by a disturbance in an area of the inner ear that helps your brain to sense movement and balance. This disturbance can be caused by a viral infection (labyrinthitis), head injury, or repetitive motion. RISK FACTORS This condition is more likely to develop in:  Women.  People who are 37 years of age or older. SYMPTOMS Symptoms of this condition usually happen when you move your head or your eyes in different directions. Symptoms may start suddenly, and they usually last for less than a minute. Symptoms may include:  Loss of balance and falling.  Feeling like you are spinning or moving.  Feeling like your surroundings are spinning or moving.  Nausea and vomiting.  Blurred vision.  Dizziness.  Involuntary eye movement (nystagmus). Symptoms can be mild and cause only slight annoyance, or they can be severe and interfere with daily life. Episodes of benign positional vertigo may return (recur) over time, and they may be triggered by certain movements. Symptoms may improve over time.  TREATMENT Usually, your health care provider will treat this by moving your head in specific positions to adjust your inner ear back to normal. Surgery may be needed in severe cases, but this is rare. In some cases, benign positional vertigo may resolve on its own in 2-4 weeks. HOME CARE INSTRUCTIONS Safety  Move slowly.Avoid sudden body or head movements.  Avoid driving.  Avoid operating heavy  machinery.  Avoid doing any tasks that would be dangerous to you or others if a vertigo episode would occur.  If you have trouble walking or keeping your balance, try using a cane for stability. If you feel dizzy or unstable, sit down right away.  Return to your normal activities as told by your health care provider. Ask your health care provider what activities are safe for you. General Instructions  Take over-the-counter and prescription medicines only as told by your health care provider.  Avoid certain positions or movements as told by your health care provider.  Drink enough fluid to keep your urine clear or pale yellow.  Keep all follow-up visits as told by your health care provider. This is important. SEEK MEDICAL CARE IF:  You have a fever.  Your condition gets worse or you develop new symptoms.  Your family or friends notice any behavioral changes.  Your nausea or vomiting gets worse.  You have numbness or a "pins and needles" sensation. SEEK IMMEDIATE MEDICAL CARE IF:  You have difficulty speaking or moving.  You are always dizzy.  You faint.  You develop severe headaches.  You have weakness in your legs or arms.  You have changes in your hearing or vision.  You develop a stiff neck.  You develop sensitivity to light.   This information is not intended to replace advice given to you by your health care provider. Make sure you discuss any questions you have with your health care provider.   Document Released: 08/30/2006 Document Revised: 08/13/2015 Document Reviewed: 03/17/2015 Elsevier Interactive Patient Education 2016  Elsevier [employer]   Use dilute hydrogen peroxide (1:1 with water) a few drops in right ear to help break down ear wax.

## 2015-11-12 NOTE — Progress Notes (Signed)
Subjective:   Patient ID: Lorie Apley, female    DOB: 08/03/1936, 79 y.o.   MRN: [id number]  COREY ZULEGER is a pleasant 79 y.o. year old female who presents to clinic today with Dizziness and Ear Pain  on 11/12/2015  HPI:  Dizziness- two days ago, woke up with dizziness worsened with changes in head positions and moving fast.  Some mild nausea, no vomiting. Feels room is spinning.  Right ear feels full.  Feels "wobbly" when she walks.  No other focal neurological symptoms.  Has never had anything like this before.  Current Outpatient Prescriptions on File Prior to Visit  Medication Sig Dispense Refill  . Calcium Carbonate-Vitamin D (CALTRATE 600+D PO) Take by mouth daily.    Marland Kitchen levothyroxine (SYNTHROID, LEVOTHROID) 112 MCG tablet Take 1 tablet by mouth  daily before breakfast 90 tablet 3  . Misc Natural Products (OSTEO BI-FLEX JOINT SHIELD PO) Take 2 tablets by mouth daily.    . Multiple Vitamins-Minerals (CENTRUM SILVER PO) Take by mouth daily.    . predniSONE (DELTASONE) 20 MG tablet Take 2 tablets by mouth daily for 7 days, then take 1 tablet by mouth daily for 7 days. 21 tablet 0  . triamcinolone cream (KENALOG) 0.1 % Apply 1 application topically 2 (two) times daily. 30 g 0  . vitamin B-12 (CYANOCOBALAMIN) 1000 MCG tablet Take 1,000 mcg by mouth daily.    Marland Kitchen zolpidem (AMBIEN) 5 MG tablet TAKE ONE TABLET BY MOUTH AT BEDTIME AS NEEDED FOR SLEEP 15 tablet 0   No current facility-administered medications on file prior to visit.    Allergies  Allergen Reactions  . Cephalexin Rash  . Sulfa Antibiotics Rash    Past Medical History  Diagnosis Date  . Arthritis   . Allergy   . Thyroid disease   . UTI (lower urinary tract infection)   . Colon polyps     Past Surgical History  Procedure Laterality Date  . Tonsillectomy and adenoidectomy  1950  . Abdominal hysterectomy  1975  . Colectomy Right 2006  . Replacement total knee Right 2011  . Cataract extraction  Bilateral 2009    Family History  Problem Relation Age of Onset  . Hypertension Mother   . Hypertension Sister   . Mental illness Sister   . Diabetes Brother   . Cancer Brother     Social History   Social History  . Marital Status: Married    Spouse Name: N/A  . Number of Children: N/A  . Years of Education: N/A   Occupational History  . Not on file.   Social History Main Topics  . Smoking status: Never Smoker   . Smokeless tobacco: Never Used  . Alcohol Use: No  . Drug Use: No  . Sexual Activity: Yes    Birth Control/ Protection: None   Other Topics Concern  . Not on file   Social History Narrative   Has a living will scanned into chart.   The PMH, PSH, Social History, Family History, Medications, and allergies have been reviewed in [facility], and have been updated if relevant.   Review of Systems  Constitutional: Negative.   HENT: Positive for ear pain. Negative for drooling, ear discharge, facial swelling, hearing loss and mouth sores.   Musculoskeletal: Positive for gait problem.  Neurological: Positive for dizziness. Negative for tremors, facial asymmetry, weakness, light-headedness and headaches.  All other systems reviewed and are negative.      Objective:    BP  122/78 mmHg  Pulse 72  Temp(Src) 98.5 F (36.9 C) (Oral)  Wt 167 lb 4 oz (75.864 kg)  SpO2 94%   Physical Exam  Constitutional: She is oriented to person, place, and time. She appears well-developed and well-nourished. No distress.  HENT:  Head: Normocephalic and atraumatic.  Cerumen impaction right +horizontal nystagmus- a few beats- with dix hallpike  Cardiovascular: Normal rate.   Pulmonary/Chest: Effort normal.  Musculoskeletal: Normal range of motion.  Neurological: She is alert and oriented to person, place, and time. No cranial nerve deficit.  Psychiatric: She has a normal mood and affect. Her behavior is normal. Judgment and thought content normal.  Vitals reviewed.           Assessment & Plan:   Dizziness and giddiness No Follow-up on file.

## 2015-11-12 NOTE — Assessment & Plan Note (Addendum)
New-  Ceruminosis is noted.  Wax is removed by syringing and manual debridement- performed by CMA, Margarite Gouge. Instructions for home care to prevent wax buildup are given. Ear exam after- unable to visualize the TM, fluid remains in canal but large piece of cerumen was removed. Call or return to clinic prn if these symptoms worsen or fail to improve as anticipated.

## 2015-11-13 DIAGNOSIS — H6123 Impacted cerumen, bilateral: Secondary | ICD-10-CM | POA: Diagnosis not present

## 2015-11-13 DIAGNOSIS — R42 Dizziness and giddiness: Secondary | ICD-10-CM | POA: Diagnosis not present

## 2015-11-13 DIAGNOSIS — H60331 Swimmer's ear, right ear: Secondary | ICD-10-CM | POA: Diagnosis not present

## 2015-12-07 DIAGNOSIS — D099 Carcinoma in situ, unspecified: Secondary | ICD-10-CM

## 2015-12-07 HISTORY — PX: SKIN CANCER EXCISION: SHX779

## 2015-12-07 HISTORY — DX: Carcinoma in situ, unspecified: D09.9

## 2016-02-04 DIAGNOSIS — D485 Neoplasm of uncertain behavior of skin: Secondary | ICD-10-CM | POA: Diagnosis not present

## 2016-02-04 DIAGNOSIS — D045 Carcinoma in situ of skin of trunk: Secondary | ICD-10-CM | POA: Diagnosis not present

## 2016-03-09 DIAGNOSIS — H60331 Swimmer's ear, right ear: Secondary | ICD-10-CM | POA: Diagnosis not present

## 2016-03-09 DIAGNOSIS — H6121 Impacted cerumen, right ear: Secondary | ICD-10-CM | POA: Diagnosis not present

## 2016-03-09 DIAGNOSIS — M26609 Unspecified temporomandibular joint disorder, unspecified side: Secondary | ICD-10-CM | POA: Diagnosis not present

## 2016-03-22 DIAGNOSIS — D045 Carcinoma in situ of skin of trunk: Secondary | ICD-10-CM | POA: Diagnosis not present

## 2016-03-22 DIAGNOSIS — D099 Carcinoma in situ, unspecified: Secondary | ICD-10-CM | POA: Diagnosis not present

## 2016-04-07 ENCOUNTER — Ambulatory Visit (INDEPENDENT_AMBULATORY_CARE_PROVIDER_SITE_OTHER): Payer: Medicare Other | Admitting: Family Medicine

## 2016-04-07 ENCOUNTER — Encounter: Payer: Self-pay | Admitting: Family Medicine

## 2016-04-07 VITALS — BP 118/76 | HR 70 | Temp 98.7°F | Ht 66.0 in | Wt 165.6 lb

## 2016-04-07 DIAGNOSIS — E039 Hypothyroidism, unspecified: Secondary | ICD-10-CM

## 2016-04-07 DIAGNOSIS — Z85828 Personal history of other malignant neoplasm of skin: Secondary | ICD-10-CM | POA: Insufficient documentation

## 2016-04-07 DIAGNOSIS — G4700 Insomnia, unspecified: Secondary | ICD-10-CM | POA: Diagnosis not present

## 2016-04-07 DIAGNOSIS — C4492 Squamous cell carcinoma of skin, unspecified: Secondary | ICD-10-CM

## 2016-04-07 LAB — T4, FREE: FREE T4: 0.96 ng/dL (ref 0.60–1.60)

## 2016-04-07 LAB — TSH: TSH: 0.7 u[IU]/mL (ref 0.35–4.50)

## 2016-04-07 MED ORDER — ZOLPIDEM TARTRATE 5 MG PO TABS: 5.0000 mg | ORAL_TABLET | Freq: Every evening | ORAL | Status: DC | PRN

## 2016-04-07 NOTE — Patient Instructions (Signed)
Nice to meet you. We're going to check her thyroid function. I refilled your Ambien. Please continue to follow-up with the dermatologist.

## 2016-04-07 NOTE — Assessment & Plan Note (Signed)
Patient reports having a squamous cell lesion removed from her anterior chest. Wound is well-healing. She will continue to follow with dermatology for this.

## 2016-04-07 NOTE — Assessment & Plan Note (Signed)
Not a significant issue. No significant caffeine intake. No alcohol intake. We'll refill Ambien as she rarely uses this.

## 2016-04-07 NOTE — Assessment & Plan Note (Signed)
Continue Synthroid. Check TSH and free T4 today.

## 2016-04-07 NOTE — Progress Notes (Signed)
Pre visit review using our clinic review tool, if applicable. No additional management support is needed unless otherwise documented below in the visit note. 

## 2016-04-07 NOTE — Progress Notes (Signed)
Patient ID: Lisa Petersen, female   DOB: 12-07-1935, 80 y.o.   MRN: [id number]  Tommi Rumps, MD Phone: [phone]  Lisa Petersen is a 80 y.o. female who presents today for follow-up and establish care visit. Patient presents as a transfer of care from our [facility] office.  HYPOTHYROIDISM Disease Monitoring Weight changes: no  Skin Changes: dry skin chronically Palpitations: no Heat/Cold intolerance: some heat intolerance She notes it does not feel as though her thyroid is out of range. Medication Monitoring Compliance:  Taking synthroid 112 g daily   Last TSH:   Lab Results  Component Value Date   TSH 0.92 06/12/2015   Insomnia: Patient notes rare issues with this at this time. Takes Ambien as needed once every 2-3 months. Only takes it if she goes 2 nights without sleep. No caffeine intake after noon. No alcohol intake. Does wake up once per night though is able to go back to sleep.  Skin cancer: Patient with potentially a squamous cell skin cancer that was removed by dermatology 2 weeks ago. She has a well-healing scar on her central chest from this. She states they told her that they got the entire lesion. She goes back for her annual check in June with the dermatologist.  PMH: nonsmoker.   ROS see history of present illness  Objective  Physical Exam Filed Vitals:   04/07/16 0824  BP: 118/76  Pulse: 70  Temp: 98.7 F (37.1 C)    BP Readings from Last 3 Encounters:  04/07/16 118/76  11/12/15 122/78  07/10/15 132/82   Wt Readings from Last 3 Encounters:  04/07/16 165 lb 9.6 oz (75.116 kg)  11/12/15 167 lb 4 oz (75.864 kg)  07/10/15 166 lb 1.9 oz (75.352 kg)    Physical Exam  Constitutional: She is well-developed, well-nourished, and in no distress.  HENT:  Head: Normocephalic.  Neck: Neck supple. No thyromegaly present.  Cardiovascular: Normal rate, regular rhythm and normal heart sounds.   Pulmonary/Chest: Effort normal and breath sounds normal.    Lymphadenopathy:    She has no cervical adenopathy.  Neurological: She is alert. Gait normal.  Skin: Skin is warm and dry. She is not diaphoretic.        Assessment/Plan: Please see individual problem list.  Hypothyroidism Continue Synthroid. Check TSH and free T4 today.  Insomnia Not a significant issue. No significant caffeine intake. No alcohol intake. We'll refill Ambien as she rarely uses this.  Squamous cell skin cancer Patient reports having a squamous cell lesion removed from her anterior chest. Wound is well-healing. She will continue to follow with dermatology for this.    Orders Placed This Encounter  Procedures  . TSH  . T4, free    Meds ordered this encounter  Medications  . zolpidem (AMBIEN) 5 MG tablet    Sig: Take 1 tablet (5 mg total) by mouth at bedtime as needed. for sleep    Dispense:  15 tablet    Refill:  0    Tommi Rumps, MD [place]

## 2016-04-08 ENCOUNTER — Encounter: Payer: Self-pay | Admitting: Family Medicine

## 2016-05-21 ENCOUNTER — Ambulatory Visit (INDEPENDENT_AMBULATORY_CARE_PROVIDER_SITE_OTHER): Payer: Medicare Other | Admitting: Family Medicine

## 2016-05-21 ENCOUNTER — Encounter: Payer: Self-pay | Admitting: Family Medicine

## 2016-05-21 VITALS — BP 134/86 | HR 82 | Temp 98.7°F | Ht 66.0 in | Wt 157.2 lb

## 2016-05-21 DIAGNOSIS — N3000 Acute cystitis without hematuria: Secondary | ICD-10-CM | POA: Diagnosis not present

## 2016-05-21 DIAGNOSIS — F418 Other specified anxiety disorders: Secondary | ICD-10-CM

## 2016-05-21 DIAGNOSIS — F329 Major depressive disorder, single episode, unspecified: Secondary | ICD-10-CM

## 2016-05-21 DIAGNOSIS — N390 Urinary tract infection, site not specified: Secondary | ICD-10-CM | POA: Insufficient documentation

## 2016-05-21 DIAGNOSIS — F419 Anxiety disorder, unspecified: Principal | ICD-10-CM

## 2016-05-21 DIAGNOSIS — F32A Depression, unspecified: Secondary | ICD-10-CM

## 2016-05-21 MED ORDER — TRAZODONE HCL 50 MG PO TABS: 50.0000 mg | ORAL_TABLET | Freq: Every evening | ORAL | Status: DC | PRN

## 2016-05-21 NOTE — Assessment & Plan Note (Signed)
Patient with new onset anxiety and depression. Mostly surrounding life events regarding her husband. Discussed treatment of physically medication therapy. Patient didn't want anything that would sedate her. Wanted something to help sleep as well. Discussed trazodone. She will stop her Ambien well on trazodone. She declined therapy. She is given return precautions.

## 2016-05-21 NOTE — Assessment & Plan Note (Signed)
Patient reports symptoms earlier this week consistent with UTI. She self treated at home with ciprofloxacin. No symptoms at this time. Benign abdominal exam. Deferred UA given lack of symptoms at this time. If symptoms recur should be evaluated. She's given return precautions.

## 2016-05-21 NOTE — Progress Notes (Signed)
Patient ID: Lisa Petersen, female   DOB: June 19, 1936, 80 y.o.   MRN: [id number]  Tommi Rumps, MD Phone: [phone]  Lisa Petersen is a 80 y.o. female who presents today for follow-up.  Anxiety/depression: Patient notes she's been going through a lot recently with her husband. Notes he has had a decline mentally over the last 6 months. Is seen by neuro and they say everything is normal. Has lots of stress from this. Notes he thinks she is having sex with whatever walks around. Is very jealous. This brings lots of stress on her. She feels depressed. Difficulty sleeping. No SI or HI.  Depression screen [facility] 2/9 05/21/2016 06/17/2015  Decreased Interest 2 0  Down, Depressed, Hopeless 2 0  PHQ - 2 Score 4 0  Altered sleeping 3 -  Tired, decreased energy 2 -  Change in appetite 3 -  Feeling bad or failure about yourself  1 -  Trouble concentrating 1 -  Moving slowly or fidgety/restless 0 -  Suicidal thoughts 0 -  PHQ-9 Score 14 -  Difficult doing work/chores Somewhat difficult -   GAD 7 : Generalized Anxiety Score 05/21/2016  Nervous, Anxious, on Edge 3  Control/stop worrying 3  Worry too much - different things 3  Trouble relaxing 3  Restless 2  Easily annoyed or irritable 1  Afraid - awful might happen 1  Total GAD 7 Score 16  Anxiety Difficulty Somewhat difficult   She also reports having had some dysuria and blood in her urine earlier this week. She has a prescription for ciprofloxacin at home which she took 500 mg twice a day for 4 days and had resolution in her symptoms. No symptoms now. No abdominal pain.  PMH: nonsmoker.   ROS see history of present illness  Objective  Physical Exam Filed Vitals:   05/21/16 1522  BP: 134/86  Pulse: 82  Temp: 98.7 F (37.1 C)    BP Readings from Last 3 Encounters:  05/21/16 134/86  04/07/16 118/76  11/12/15 122/78   Wt Readings from Last 3 Encounters:  05/21/16 157 lb 3.2 oz (71.305 kg)  04/07/16 165 lb 9.6 oz (75.116  kg)  11/12/15 167 lb 4 oz (75.864 kg)    Physical Exam  Constitutional: She is well-developed, well-nourished, and in no distress.  HENT:  Head: Normocephalic and atraumatic.  Right Ear: External ear normal.  Left Ear: External ear normal.  Cardiovascular: Normal rate, regular rhythm and normal heart sounds.   Pulmonary/Chest: Effort normal.  Abdominal: Soft. She exhibits no distension. There is no tenderness.  Skin: Skin is warm and dry. She is not diaphoretic.  Psychiatric:  Mood depressed and anxious, affect depressed and intermittently tearful     Assessment/Plan: Please see individual problem list.  Anxiety and depression Patient with new onset anxiety and depression. Mostly surrounding life events regarding her husband. Discussed treatment of physically medication therapy. Patient didn't want anything that would sedate her. Wanted something to help sleep as well. Discussed trazodone. She will stop her Ambien well on trazodone. She declined therapy. She is given return precautions.  UTI (urinary tract infection) Patient reports symptoms earlier this week consistent with UTI. She self treated at home with ciprofloxacin. No symptoms at this time. Benign abdominal exam. Deferred UA given lack of symptoms at this time. If symptoms recur should be evaluated. She's given return precautions.    No orders of the defined types were placed in this encounter.    Meds ordered this encounter  Medications  . traZODone (DESYREL) 50 MG tablet    Sig: Take 1 tablet (50 mg total) by mouth at bedtime as needed for sleep.    Dispense:  30 tablet    Refill:  [place], MD [place]

## 2016-05-21 NOTE — Patient Instructions (Signed)
Nice to see you. We're going to start you on trazodone for anxiety and depression and to help you sleep. While you're on this please stop the Ambien. If you have recurrence of your burning or blood in urine please seek medical attention. If you develop thoughts of harming herself or others please seek medical attention immediately.

## 2016-05-21 NOTE — Progress Notes (Signed)
Pre visit review using our clinic review tool, if applicable. No additional management support is needed unless otherwise documented below in the visit note. 

## 2016-06-02 DIAGNOSIS — L570 Actinic keratosis: Secondary | ICD-10-CM | POA: Diagnosis not present

## 2016-06-02 DIAGNOSIS — Z85828 Personal history of other malignant neoplasm of skin: Secondary | ICD-10-CM | POA: Diagnosis not present

## 2016-06-10 ENCOUNTER — Other Ambulatory Visit: Payer: Medicare Other

## 2016-06-17 ENCOUNTER — Encounter: Payer: Medicare Other | Admitting: Family Medicine

## 2016-06-18 ENCOUNTER — Encounter: Payer: Self-pay | Admitting: Family Medicine

## 2016-06-18 ENCOUNTER — Ambulatory Visit (INDEPENDENT_AMBULATORY_CARE_PROVIDER_SITE_OTHER): Payer: Medicare Other | Admitting: Family Medicine

## 2016-06-18 VITALS — BP 112/72 | HR 68 | Temp 98.6°F | Ht 66.0 in | Wt 158.0 lb

## 2016-06-18 DIAGNOSIS — R350 Frequency of micturition: Secondary | ICD-10-CM

## 2016-06-18 DIAGNOSIS — E039 Hypothyroidism, unspecified: Secondary | ICD-10-CM | POA: Diagnosis not present

## 2016-06-18 DIAGNOSIS — N3000 Acute cystitis without hematuria: Secondary | ICD-10-CM | POA: Diagnosis not present

## 2016-06-18 LAB — POCT URINALYSIS DIPSTICK
Bilirubin, UA: NEGATIVE
Blood, UA: POSITIVE
Glucose, UA: NEGATIVE
Ketones, UA: NEGATIVE
LEUKOCYTES UA: NEGATIVE
NITRITE UA: NEGATIVE
PH UA: 6
PROTEIN UA: NEGATIVE
Spec Grav, UA: 1.02
Urobilinogen, UA: 0.2

## 2016-06-18 LAB — URINALYSIS, MICROSCOPIC ONLY

## 2016-06-18 MED ORDER — LEVOTHYROXINE SODIUM 112 MCG PO TABS: ORAL_TABLET | ORAL | Status: DC

## 2016-06-18 MED ORDER — TRAZODONE HCL 50 MG PO TABS: 50.0000 mg | ORAL_TABLET | Freq: Every evening | ORAL | Status: DC | PRN

## 2016-06-18 NOTE — Patient Instructions (Signed)
Nice to see you. Please continue your trazodone. I have refilled your Synthroid. If you develop recurrent urine symptoms, abdominal pain, fevers, worsening depression or anxiety, thoughts of harming herself or others, or any new or changing symptoms please seek medical attention.

## 2016-06-18 NOTE — Assessment & Plan Note (Addendum)
Recent recurrent symptoms. Has treated with Cipro and has no symptoms at this time. UA today with blood though otherwise unremarkable. We'll send urine for culture and microscopy. Given that she is asymptomatic at this time we will hold off on treatment until the culture comes back. Return precautions given in AVS.

## 2016-06-18 NOTE — Progress Notes (Signed)
Pre visit review using our clinic review tool, if applicable. No additional management support is needed unless otherwise documented below in the visit note. 

## 2016-06-18 NOTE — Progress Notes (Signed)
Patient ID: Lisa Petersen, female   DOB: [birth year], 80 y.o.   MRN: [id number]  Tommi Rumps, MD Phone: [phone]  Lisa Petersen is a 80 y.o. female who presents today for f/u.  HYPOTHYROIDISM Disease Monitoring Weight changes: weight was down, now slightly up now that her depression is improved  Skin Changes: no Palpitations: no Heat/Cold intolerance: some heat intolerance  Medication Monitoring Compliance:  Taking synthroid   Last TSH:   Lab Results  Component Value Date   TSH 0.70 04/07/2016   Depression and anxiety: Patient notes this is significantly better. PHQ 2 scoring is 0. Taking trazodone and this helps her sleep at night. Note she has just let things sort of settle out from her prior home life experiences. Feels Significantly better. No SI or HI.  UTI: Patient notes she started on Monday with frequency and some burning. Notes she started taking Cipro which she has at home. No symptoms at this time. No frequency, urgency, dysuria, or fevers. No abdominal pain either. States she with urology and her prior PCP for this issue and was found to have nothing anatomically wrong with her urinary tract. Keeps a prescription of ciprofloxacin at home.  PMH: nonsmoker.   ROS see history of present illness  Objective  Physical Exam Filed Vitals:   06/18/16 0831  BP: 112/72  Pulse: 68  Temp: 98.6 F (37 C)    BP Readings from Last 3 Encounters:  06/18/16 112/72  05/21/16 134/86  04/07/16 118/76   Wt Readings from Last 3 Encounters:  06/18/16 158 lb (71.668 kg)  05/21/16 157 lb 3.2 oz (71.305 kg)  04/07/16 165 lb 9.6 oz (75.116 kg)    Physical Exam  Constitutional: She is well-developed, well-nourished, and in no distress.  HENT:  Head: Normocephalic and atraumatic.  Cardiovascular: Normal rate, regular rhythm and normal heart sounds.   Pulmonary/Chest: Effort normal and breath sounds normal.  Abdominal: Soft. Bowel sounds are normal. She exhibits no  distension. There is no tenderness. There is no rebound and no guarding.  Musculoskeletal: She exhibits no edema.  Neurological: She is alert. Gait normal.  Skin: Skin is warm and dry. She is not diaphoretic.  Psychiatric: Mood and affect normal.     Assessment/Plan: Please see individual problem list.  Anxiety and depression Significantly improved. We'll continue trazodone. She will continue to monitor.  Hypothyroidism Recent TSH in normal range. We will refill Synthroid today.  UTI (urinary tract infection) Recent recurrent symptoms. Has treated with Cipro and has no symptoms at this time. UA today with blood though otherwise unremarkable. We'll send urine for culture and microscopy. Given that she is asymptomatic at this time we will hold off on treatment until the culture comes back. Return precautions given in AVS.    Orders Placed This Encounter  Procedures  . Urine Culture  . Urine Microscopic Only  . POCT Urinalysis Dipstick    Meds ordered this encounter  Medications  . levothyroxine (SYNTHROID, LEVOTHROID) 112 MCG tablet    Sig: Take 1 tablet by mouth  daily before breakfast    Dispense:  90 tablet    Refill:  3  . traZODone (DESYREL) 50 MG tablet    Sig: Take 1 tablet (50 mg total) by mouth at bedtime as needed for sleep.    Dispense:  90 tablet    Refill:  [place], MD [place]

## 2016-06-18 NOTE — Assessment & Plan Note (Signed)
Significantly improved. We'll continue trazodone. She will continue to monitor.

## 2016-06-18 NOTE — Assessment & Plan Note (Signed)
Recent TSH in normal range. We will refill Synthroid today.

## 2016-06-19 LAB — URINE CULTURE

## 2016-08-30 DIAGNOSIS — H26492 Other secondary cataract, left eye: Secondary | ICD-10-CM | POA: Diagnosis not present

## 2016-10-07 ENCOUNTER — Encounter: Payer: Self-pay | Admitting: Family Medicine

## 2016-10-07 ENCOUNTER — Ambulatory Visit (INDEPENDENT_AMBULATORY_CARE_PROVIDER_SITE_OTHER): Payer: Medicare Other | Admitting: Family Medicine

## 2016-10-07 VITALS — BP 122/86 | HR 65 | Temp 98.2°F | Wt 148.6 lb

## 2016-10-07 DIAGNOSIS — Z13820 Encounter for screening for osteoporosis: Secondary | ICD-10-CM

## 2016-10-07 DIAGNOSIS — Z13220 Encounter for screening for lipoid disorders: Secondary | ICD-10-CM

## 2016-10-07 DIAGNOSIS — E039 Hypothyroidism, unspecified: Secondary | ICD-10-CM

## 2016-10-07 DIAGNOSIS — Z0000 Encounter for general adult medical examination without abnormal findings: Secondary | ICD-10-CM | POA: Diagnosis not present

## 2016-10-07 DIAGNOSIS — Z0001 Encounter for general adult medical examination with abnormal findings: Secondary | ICD-10-CM | POA: Insufficient documentation

## 2016-10-07 DIAGNOSIS — Z1231 Encounter for screening mammogram for malignant neoplasm of breast: Secondary | ICD-10-CM

## 2016-10-07 DIAGNOSIS — Z130 Encounter for screening for diseases of the blood and blood-forming organs and certain disorders involving the immune mechanism: Secondary | ICD-10-CM

## 2016-10-07 DIAGNOSIS — Z1239 Encounter for other screening for malignant neoplasm of breast: Secondary | ICD-10-CM

## 2016-10-07 LAB — LIPID PANEL
CHOLESTEROL: 170 mg/dL (ref 0–200)
HDL: 46.3 mg/dL (ref >39.00)
LDL Cholesterol: 104 mg/dL — ABNORMAL HIGH (ref 0–99)
NONHDL: 124
Total CHOL/HDL Ratio: 4
Triglycerides: 98 mg/dL (ref 0.0–149.0)
VLDL: 19.6 mg/dL (ref 0.0–40.0)

## 2016-10-07 LAB — COMPREHENSIVE METABOLIC PANEL
ALBUMIN: 4.1 g/dL (ref 3.5–5.2)
ALK PHOS: 43 U/L (ref 39–117)
ALT: 14 U/L (ref 0–35)
AST: 14 U/L (ref 0–37)
BILIRUBIN TOTAL: 0.4 mg/dL (ref 0.2–1.2)
BUN: 16 mg/dL (ref 6–23)
CALCIUM: 9.3 mg/dL (ref 8.4–10.5)
CO2: 29 mEq/L (ref 19–32)
Chloride: 105 mEq/L (ref 96–112)
Creatinine, Ser: 0.86 mg/dL (ref 0.40–1.20)
GFR: 67.79 mL/min (ref >60.00)
Glucose, Bld: 94 mg/dL (ref 70–99)
POTASSIUM: 4.3 meq/L (ref 3.5–5.1)
Sodium: 140 mEq/L (ref 135–145)
TOTAL PROTEIN: 6.3 g/dL (ref 6.0–8.3)

## 2016-10-07 LAB — CBC
HEMATOCRIT: 39.3 % (ref 36.0–46.0)
HEMOGLOBIN: 13.2 g/dL (ref 12.0–15.0)
MCHC: 33.6 g/dL (ref 30.0–36.0)
MCV: 89.5 fl (ref 78.0–100.0)
PLATELETS: 227 10*3/uL (ref 150.0–400.0)
RBC: 4.39 Mil/uL (ref 3.87–5.11)
RDW: 13.4 % (ref 11.5–15.5)
WBC: 4.6 10*3/uL (ref 4.0–10.5)

## 2016-10-07 LAB — TSH: TSH: 0.54 u[IU]/mL (ref 0.35–4.50)

## 2016-10-07 NOTE — Progress Notes (Signed)
Tommi Rumps, MD Phone: [phone]  Lisa Petersen is a 80 y.o. female who presents today for physical exam.  Diet is described as pretty healthy. Not eating as much junk food. Not exercising much. She is going to join [employer]. She is status post hysterectomy and BSO about 40 years ago. Has not had a Pap smear in many years due to her age. Mammogram yearly. She is due for one. DEXA scan is due. She reports colonoscopy is up-to-date 01/02/13. Up-to-date on vaccinations. No tobacco, alcohol, or illicit drug use. Sees a dentist twice a year. Ophthalmologist once a year. 3 brothers with history of bladder cancer, pancreatic cancer, and lung cancer. She does endorse some mild stress incontinence with coughing and sneezing. Does not bother her too much. No other urinary symptoms.   Active Ambulatory Problems    Diagnosis Date Noted  . Hypothyroidism 11/14/2014  . Insomnia 06/17/2015  . Grief 06/17/2015  . Dizziness and giddiness 11/12/2015  . Cerumen impaction 11/12/2015  . Squamous cell skin cancer 04/07/2016  . Anxiety and depression 05/21/2016  . UTI (urinary tract infection) 05/21/2016  . Routine general medical examination at a health care facility 10/07/2016   Resolved Ambulatory Problems    Diagnosis Date Noted  . Protracted upper respiratory infection 11/14/2014  . Right lower quadrant abdominal mass 11/14/2014  . Well woman exam 06/12/2015  . Medicare annual wellness visit, subsequent 06/17/2015   Past Medical History:  Diagnosis Date  . Allergy   . Arthritis   . Colon polyps   . Thyroid disease   . UTI (lower urinary tract infection)     Family History  Problem Relation Age of Onset  . Hypertension Mother   . Hypertension Sister   . Mental illness Sister   . Diabetes Brother   . Cancer Brother     Social History   Social History  . Marital status: Married    Spouse name: N/A  . Number of children: N/A  . Years of education: N/A    Occupational History  . Not on file.   Social History Main Topics  . Smoking status: Never Smoker  . Smokeless tobacco: Never Used  . Alcohol use No  . Drug use: No  . Sexual activity: Yes    Birth control/ protection: None   Other Topics Concern  . Not on file   Social History Narrative   Has a living will scanned into chart.    ROS  General:  Negative for nexplained weight loss, fever Skin: Negative for new or changing mole, sore that won't heal HEENT: Negative for trouble hearing, trouble seeing, ringing in ears, mouth sores, hoarseness, change in voice, dysphagia. CV:  Negative for chest pain, dyspnea, edema, palpitations Resp: Negative for cough, dyspnea, hemoptysis GI: Negative for nausea, vomiting, diarrhea, constipation, abdominal pain, melena, hematochezia. GU: Positive for stress incontinence, Negative for dysuria, urinary hesitance, hematuria, vaginal or penile discharge, polyuria, sexual difficulty, lumps in testicle or breasts MSK: Negative for muscle cramps or aches, joint pain or swelling Neuro: Negative for headaches, weakness, numbness, dizziness, passing out/fainting Psych: Negative for depression, anxiety, memory problems  Objective  Physical Exam Vitals:   10/07/16 0758  BP: 122/86  Pulse: 65  Temp: 98.2 F (36.8 C)    BP Readings from Last 3 Encounters:  10/07/16 122/86  06/18/16 112/72  05/21/16 134/86   Wt Readings from Last 3 Encounters:  10/07/16 148 lb 9.6 oz (67.4 kg)  06/18/16 158 lb (71.7 kg)  05/21/16 157 lb 3.2 oz (71.3 kg)    Physical Exam  Constitutional: She is well-developed, well-nourished, and in no distress.  HENT:  Head: Normocephalic and atraumatic.  Mouth/Throat: Oropharynx is clear and moist. No oropharyngeal exudate.  Eyes: Conjunctivae are normal. Pupils are equal, round, and reactive to light.  Neck: Neck supple.  Cardiovascular: Normal rate, regular rhythm and normal heart sounds.   Pulmonary/Chest: Effort  normal and breath sounds normal.  Abdominal: Soft. Bowel sounds are normal. She exhibits no distension. There is no tenderness. There is no rebound and no guarding.  Genitourinary:  Genitourinary Comments: Breast exam with no masses or nipple inversion, chronic seborrheic keratoses noted, no skin changes from previously per patient report  Musculoskeletal: She exhibits no edema.  Lymphadenopathy:    She has no cervical adenopathy.  Neurological: She is alert. Gait normal.  Skin: Skin is warm and dry.  Psychiatric: Mood and affect normal.     Assessment/Plan:   Routine general medical examination at a health care facility Patient is doing well. Blood pressure is well controlled. Pap smear deferred given history of hysterectomy and BSO. Mammogram ordered. DEXA scan ordered. Lab work as outlined below. She'll start working on diet and exercise. Discussed Kegel exercises for stress incontinence.   Orders Placed This Encounter  Procedures  . MM SCREENING BREAST TOMO BILATERAL    Standing Status:   Future    Standing Expiration Date:   12/07/2017    Order Specific Question:   Reason for Exam (SYMPTOM  OR DIAGNOSIS REQUIRED)    Answer:   breast cancer screening    Order Specific Question:   Preferred imaging location?    Answer:   [facility]  . DG Bone Density    Standing Status:   Future    Standing Expiration Date:   12/07/2017    Order Specific Question:   Reason for Exam (SYMPTOM  OR DIAGNOSIS REQUIRED)    Answer:   osteoporosis screening    Order Specific Question:   Preferred imaging location?    Answer:   [facility]  . TSH  . Comp Met (CMET)  . Lipid Profile  . CBC   Tommi Rumps, MD [place]

## 2016-10-07 NOTE — Progress Notes (Signed)
Pre visit review using our clinic review tool, if applicable. No additional management support is needed unless otherwise documented below in the visit note. 

## 2016-10-07 NOTE — Patient Instructions (Addendum)
Nice to see you. We will check some lab work and call you with the results. Please start exercising.

## 2016-10-07 NOTE — Assessment & Plan Note (Addendum)
Patient is doing well. Blood pressure is well controlled. Pap smear deferred given history of hysterectomy and BSO. Mammogram ordered. DEXA scan ordered. Lab work as outlined below. She'll start working on diet and exercise. Discussed Kegel exercises for stress incontinence.

## 2016-10-20 ENCOUNTER — Encounter: Payer: Self-pay | Admitting: Family Medicine

## 2016-12-08 ENCOUNTER — Ambulatory Visit
Admission: RE | Admit: 2016-12-08 | Discharge: 2016-12-08 | Disposition: A | Discharge status: Home / Self Care | Payer: Medicare HMO | Source: Ambulatory Visit | Attending: Family Medicine | Admitting: Family Medicine

## 2016-12-08 DIAGNOSIS — M8580 Other specified disorders of bone density and structure, unspecified site: Secondary | ICD-10-CM | POA: Diagnosis not present

## 2016-12-08 DIAGNOSIS — Z1239 Encounter for other screening for malignant neoplasm of breast: Secondary | ICD-10-CM

## 2016-12-08 DIAGNOSIS — M85852 Other specified disorders of bone density and structure, left thigh: Secondary | ICD-10-CM | POA: Diagnosis not present

## 2016-12-08 DIAGNOSIS — Z1231 Encounter for screening mammogram for malignant neoplasm of breast: Secondary | ICD-10-CM | POA: Insufficient documentation

## 2016-12-08 DIAGNOSIS — Z13820 Encounter for screening for osteoporosis: Secondary | ICD-10-CM

## 2016-12-09 DIAGNOSIS — H6122 Impacted cerumen, left ear: Secondary | ICD-10-CM | POA: Diagnosis not present

## 2016-12-09 DIAGNOSIS — H60332 Swimmer's ear, left ear: Secondary | ICD-10-CM | POA: Diagnosis not present

## 2016-12-09 DIAGNOSIS — J309 Allergic rhinitis, unspecified: Secondary | ICD-10-CM | POA: Diagnosis not present

## 2017-01-03 ENCOUNTER — Other Ambulatory Visit: Payer: Self-pay | Admitting: Family Medicine

## 2017-01-03 MED ORDER — TRAZODONE HCL 50 MG PO TABS: 50.0000 mg | ORAL_TABLET | Freq: Every evening | ORAL | 3 refills | Status: DC | PRN

## 2017-01-03 MED ORDER — LEVOTHYROXINE SODIUM 112 MCG PO TABS: ORAL_TABLET | ORAL | 3 refills | Status: DC

## 2017-01-03 NOTE — Telephone Encounter (Signed)
Pt has changed Pharmacy and needs them now to go to [facility] order   levothyroxine (SYNTHROID, LEVOTHROID) 112 MCG tablet traZODone (DESYREL) 50 MG tablet

## 2017-01-03 NOTE — Telephone Encounter (Signed)
Last filled trazadone 06/18/16 90 3rf Levothyroxine 06/18/16 90 3rf

## 2017-04-06 ENCOUNTER — Ambulatory Visit (INDEPENDENT_AMBULATORY_CARE_PROVIDER_SITE_OTHER): Payer: Medicare HMO | Admitting: Family Medicine

## 2017-04-06 ENCOUNTER — Other Ambulatory Visit: Payer: Self-pay | Admitting: Family Medicine

## 2017-04-06 ENCOUNTER — Encounter: Payer: Self-pay | Admitting: Family Medicine

## 2017-04-06 VITALS — BP 134/88 | HR 70 | Temp 98.5°F | Wt 150.8 lb

## 2017-04-06 DIAGNOSIS — F419 Anxiety disorder, unspecified: Secondary | ICD-10-CM | POA: Diagnosis not present

## 2017-04-06 DIAGNOSIS — F329 Major depressive disorder, single episode, unspecified: Secondary | ICD-10-CM | POA: Diagnosis not present

## 2017-04-06 DIAGNOSIS — G4700 Insomnia, unspecified: Secondary | ICD-10-CM | POA: Diagnosis not present

## 2017-04-06 DIAGNOSIS — M1990 Unspecified osteoarthritis, unspecified site: Secondary | ICD-10-CM | POA: Insufficient documentation

## 2017-04-06 DIAGNOSIS — M19042 Primary osteoarthritis, left hand: Secondary | ICD-10-CM | POA: Diagnosis not present

## 2017-04-06 DIAGNOSIS — E039 Hypothyroidism, unspecified: Secondary | ICD-10-CM | POA: Diagnosis not present

## 2017-04-06 DIAGNOSIS — R200 Anesthesia of skin: Secondary | ICD-10-CM | POA: Insufficient documentation

## 2017-04-06 DIAGNOSIS — F32A Depression, unspecified: Secondary | ICD-10-CM

## 2017-04-06 DIAGNOSIS — R69 Illness, unspecified: Secondary | ICD-10-CM | POA: Diagnosis not present

## 2017-04-06 DIAGNOSIS — M19041 Primary osteoarthritis, right hand: Secondary | ICD-10-CM

## 2017-04-06 LAB — TSH: TSH: 0.37 u[IU]/mL (ref 0.35–4.50)

## 2017-04-06 NOTE — Patient Instructions (Signed)
Nice to see you. Please monitor the anxiety and depression and if this worsens please let us know.  Please try not to sit with your elbows flexed and see if this helps with the numbness and tingling in your fingers. We'll check lab work and contact you with results.

## 2017-04-06 NOTE — Assessment & Plan Note (Signed)
DIP joint findings consistent with osteoarthritis. She has no pain related to this. She wants to continue to monitor.

## 2017-04-06 NOTE — Progress Notes (Signed)
  Tommi Rumps, MD Phone: [phone]  Lisa Petersen is a 81 y.o. female who presents today for f/u.  HYPOTHYROIDISM Disease Monitoring Weight changes: no  Skin Changes: no Heat/Cold intolerance: some chronic heat intolerance  Medication Monitoring Compliance:  Taking synthroid   Last TSH:   Lab Results  Component Value Date   TSH 0.54 10/07/2016   Anxiety/depression: this is quite a bit better. Some days are better than others. She deals with her husband a lot. He has evidently had 3 strokes and at times refuses to go to the hospital or the doctor's office to be evaluated. She notes she is able to deal with all of his issues better now than she was prior. No SI. Not taking the trazodone consistently. Does take it occasionally to help her sleep. Gets 6 hours of sleep typically. No naps during the day.  Patient notes occasionally her bilateral fingers will get numb and tingly if she has her elbows bent. Rarely occurs at other times. She notes she changes positions and this goes away quickly. No numbness or weakness elsewhere. She does have fairly significant osteoarthritis findings in the DIP joints on exam.   PMH: nonsmoker.   ROS see history of present illness  Objective  Physical Exam Vitals:   04/06/17 0838  BP: 134/88  Pulse: 70  Temp: 98.5 F (36.9 C)    BP Readings from Last 3 Encounters:  04/06/17 134/88  10/07/16 122/86  06/18/16 112/72   Wt Readings from Last 3 Encounters:  04/06/17 150 lb 12.8 oz (68.4 kg)  10/07/16 148 lb 9.6 oz (67.4 kg)  06/18/16 158 lb (71.7 kg)    Physical Exam  Constitutional: No distress.  HENT:  Head: Normocephalic and atraumatic.  Mouth/Throat: Oropharynx is clear and moist. No oropharyngeal exudate.  Eyes: Conjunctivae are normal. Pupils are equal, round, and reactive to light.  Cardiovascular: Normal rate, regular rhythm and normal heart sounds.   Pulmonary/Chest: Effort normal and breath sounds normal.    Musculoskeletal: She exhibits no edema.  DIP joints bilateral hands with fairly significant nodularity and occasional deviation, no noted nodularity or swelling or deviation at the PIP joints or MCP joints  Neurological: She is alert. Gait normal.  CN 2-12 intact, 5/5 strength in bilateral biceps, triceps, grip, quads, hamstrings, plantar and dorsiflexion, sensation to light touch intact in bilateral UE and LE  Skin: Skin is warm and dry. She is not diaphoretic.     Assessment/Plan: Please see individual problem list.  Hypothyroidism Check TSH. Continue Synthroid.  Anxiety and depression Much improved from prior. She'll continue trazodone as needed to help sleep. She'll continue to monitor her symptoms.  Insomnia Continue as needed trazodone.  Bilateral finger numbness Suspect related to positional cause given that it improves with change of position at her elbows. Neurologically intact. Reassuring that it is bilateral in nature. Resolves quickly. Encouraged her to not sit with her elbows flexed. She'll continue to monitor. Given return precautions.  Osteoarthritis DIP joint findings consistent with osteoarthritis. She has no pain related to this. She wants to continue to monitor.   Orders Placed This Encounter  Procedures  . TSH    Tommi Rumps, MD [place]

## 2017-04-06 NOTE — Assessment & Plan Note (Signed)
Continue as needed trazodone. 

## 2017-04-06 NOTE — Assessment & Plan Note (Signed)
Much improved from prior. She'll continue trazodone as needed to help sleep. She'll continue to monitor her symptoms.

## 2017-04-06 NOTE — Progress Notes (Signed)
Pre visit review using our clinic review tool, if applicable. No additional management support is needed unless otherwise documented below in the visit note. 

## 2017-04-06 NOTE — Assessment & Plan Note (Signed)
Suspect related to positional cause given that it improves with change of position at her elbows. Neurologically intact. Reassuring that it is bilateral in nature. Resolves quickly. Encouraged her to not sit with her elbows flexed. She'll continue to monitor. Given return precautions.

## 2017-04-06 NOTE — Assessment & Plan Note (Signed)
Check TSH.  Continue Synthroid. 

## 2017-06-14 DIAGNOSIS — Z85828 Personal history of other malignant neoplasm of skin: Secondary | ICD-10-CM | POA: Diagnosis not present

## 2017-06-14 DIAGNOSIS — L820 Inflamed seborrheic keratosis: Secondary | ICD-10-CM | POA: Diagnosis not present

## 2017-06-22 DIAGNOSIS — M21942 Unspecified acquired deformity of hand, left hand: Secondary | ICD-10-CM | POA: Diagnosis not present

## 2017-06-22 DIAGNOSIS — Z6824 Body mass index (BMI) 24.0-24.9, adult: Secondary | ICD-10-CM | POA: Diagnosis not present

## 2017-06-22 DIAGNOSIS — E039 Hypothyroidism, unspecified: Secondary | ICD-10-CM | POA: Diagnosis not present

## 2017-06-22 DIAGNOSIS — M859 Disorder of bone density and structure, unspecified: Secondary | ICD-10-CM | POA: Diagnosis not present

## 2017-06-22 DIAGNOSIS — Z0000 Encounter for general adult medical examination without abnormal findings: Secondary | ICD-10-CM | POA: Diagnosis not present

## 2017-07-07 ENCOUNTER — Other Ambulatory Visit (INDEPENDENT_AMBULATORY_CARE_PROVIDER_SITE_OTHER): Payer: Medicare HMO

## 2017-07-07 DIAGNOSIS — E039 Hypothyroidism, unspecified: Secondary | ICD-10-CM

## 2017-07-07 LAB — TSH: TSH: 0.93 u[IU]/mL (ref 0.35–4.50)

## 2017-08-17 DIAGNOSIS — H60541 Acute eczematoid otitis externa, right ear: Secondary | ICD-10-CM | POA: Diagnosis not present

## 2017-08-17 DIAGNOSIS — H6123 Impacted cerumen, bilateral: Secondary | ICD-10-CM | POA: Diagnosis not present

## 2017-09-21 DIAGNOSIS — R69 Illness, unspecified: Secondary | ICD-10-CM | POA: Diagnosis not present

## 2017-10-03 DIAGNOSIS — H43813 Vitreous degeneration, bilateral: Secondary | ICD-10-CM | POA: Diagnosis not present

## 2017-10-07 ENCOUNTER — Encounter: Payer: Self-pay | Admitting: Family Medicine

## 2017-10-07 ENCOUNTER — Ambulatory Visit (INDEPENDENT_AMBULATORY_CARE_PROVIDER_SITE_OTHER): Payer: Medicare HMO | Admitting: Family Medicine

## 2017-10-07 ENCOUNTER — Other Ambulatory Visit: Payer: Self-pay | Admitting: Family Medicine

## 2017-10-07 VITALS — BP 118/82 | HR 63 | Temp 98.6°F | Wt 149.8 lb

## 2017-10-07 DIAGNOSIS — F329 Major depressive disorder, single episode, unspecified: Secondary | ICD-10-CM

## 2017-10-07 DIAGNOSIS — F32A Depression, unspecified: Secondary | ICD-10-CM

## 2017-10-07 DIAGNOSIS — E039 Hypothyroidism, unspecified: Secondary | ICD-10-CM

## 2017-10-07 DIAGNOSIS — F419 Anxiety disorder, unspecified: Secondary | ICD-10-CM

## 2017-10-07 DIAGNOSIS — R350 Frequency of micturition: Secondary | ICD-10-CM | POA: Diagnosis not present

## 2017-10-07 DIAGNOSIS — N3000 Acute cystitis without hematuria: Secondary | ICD-10-CM | POA: Diagnosis not present

## 2017-10-07 DIAGNOSIS — R69 Illness, unspecified: Secondary | ICD-10-CM | POA: Diagnosis not present

## 2017-10-07 LAB — COMPREHENSIVE METABOLIC PANEL
ALK PHOS: 37 U/L — AB (ref 39–117)
ALT: 15 U/L (ref 0–35)
AST: 17 U/L (ref 0–37)
Albumin: 4 g/dL (ref 3.5–5.2)
BILIRUBIN TOTAL: 0.4 mg/dL (ref 0.2–1.2)
BUN: 15 mg/dL (ref 6–23)
CO2: 31 meq/L (ref 19–32)
CREATININE: 0.86 mg/dL (ref 0.40–1.20)
Calcium: 10.2 mg/dL (ref 8.4–10.5)
Chloride: 104 mEq/L (ref 96–112)
GFR: 67.62 mL/min (ref >60.00)
GLUCOSE: 88 mg/dL (ref 70–99)
Potassium: 4.5 mEq/L (ref 3.5–5.1)
Sodium: 139 mEq/L (ref 135–145)
TOTAL PROTEIN: 6.3 g/dL (ref 6.0–8.3)

## 2017-10-07 LAB — POCT URINALYSIS DIPSTICK
Bilirubin, UA: NEGATIVE
Glucose, UA: NEGATIVE
Ketones, UA: NEGATIVE
NITRITE UA: NEGATIVE
PH UA: 5.5 (ref 5.0–8.0)
PROTEIN UA: NEGATIVE
Spec Grav, UA: 1.02 (ref 1.010–1.025)
UROBILINOGEN UA: 0.2 U/dL

## 2017-10-07 LAB — URINALYSIS, MICROSCOPIC ONLY

## 2017-10-07 MED ORDER — NITROFURANTOIN MONOHYD MACRO 100 MG PO CAPS: 100.0000 mg | ORAL_CAPSULE | Freq: Two times a day (BID) | ORAL | 0 refills | Status: DC

## 2017-10-07 NOTE — Assessment & Plan Note (Signed)
Stable. Has had some weight loss last year over a period of several months. Has stabilized over the last year and she feels much improved. She notes no other symptoms. Reports she changed her diet fairly drastically and has been much more active. She'll monitor given stabilization of weight. Reports colonoscopy up-to-date. Mammogram up-to-date. Status post hysterectomy. Continue trazodone.

## 2017-10-07 NOTE — Progress Notes (Signed)
  Tommi Rumps, MD Phone: [phone]  Lisa Petersen is a 81 y.o. female who presents today for follow-up.  Hypothyroidism: Taking Synthroid. Does note some meat intolerance. No skin changes. She does note previously having lost some weight though it looks like it's been stable over the last year. She cut out junk food. Has been more active. Lots of stress with her husband having dementia. She feels well overall. She actually feels better with the weight loss. No night sweats. No other symptoms.  Does note some anxiety. Does have some sleep issues though she is doing well on trazodone. She tried to take her situation with her husband in stride.  She reports she had UTI symptoms about a week ago. She took Cipro at home for 3 days though still has some frequency. No other UTI symptoms at this time.  PMH: nonsmoker.   ROS see history of present illness  Objective  Physical Exam Vitals:   10/07/17 0955  BP: 118/82  Pulse: 63  Temp: 98.6 F (37 C)  SpO2: 97%    BP Readings from Last 3 Encounters:  10/07/17 118/82  04/06/17 134/88  10/07/16 122/86   Wt Readings from Last 3 Encounters:  10/07/17 149 lb 12.8 oz (67.9 kg)  04/06/17 150 lb 12.8 oz (68.4 kg)  10/07/16 148 lb 9.6 oz (67.4 kg)    Physical Exam  Constitutional: No distress.  Cardiovascular: Normal rate, regular rhythm and normal heart sounds.   Pulmonary/Chest: Effort normal and breath sounds normal.  Abdominal: Soft. Bowel sounds are normal. She exhibits no distension. There is no tenderness.  Musculoskeletal: She exhibits no edema.  Lymphadenopathy:    She has no cervical adenopathy.       Right: No supraclavicular adenopathy present.       Left: No supraclavicular adenopathy present.  Neurological: She is alert. Gait normal.  Skin: Skin is warm and dry. She is not diaphoretic.     Assessment/Plan: Please see individual problem list.  Hypothyroidism Stable. Most recent TSH normal.  Anxiety and  depression Stable. Has had some weight loss last year over a period of several months. Has stabilized over the last year and she feels much improved. She notes no other symptoms. Reports she changed her diet fairly drastically and has been much more active. She'll monitor given stabilization of weight. Reports colonoscopy up-to-date. Mammogram up-to-date. Status post hysterectomy. Continue trazodone.  UTI (urinary tract infection) Patient with UTI symptoms. Did improve somewhat with Cipro that she had at home. We'll need to check her renal function given that it's been a year since it has been checked. UA concerning for continued infection. Once creatinine comes back we'll determine whether to place her on Cipro or Macrobid.   Orders Placed This Encounter  Procedures  . Urine Culture  . Comp Met (CMET)  . POCT Urinalysis Dipstick    Tommi Rumps, MD [place]

## 2017-10-07 NOTE — Addendum Note (Signed)
Addended by: Leeanne Rio on: 10/07/2017 02:04 PM   Modules accepted: Orders

## 2017-10-07 NOTE — Patient Instructions (Signed)
Nice to see you. We'll check your renal function today. We'll then determine what antibiotic to put you on.

## 2017-10-07 NOTE — Assessment & Plan Note (Signed)
Patient with UTI symptoms. Did improve somewhat with Cipro that she had at home. We'll need to check her renal function given that it's been a year since it has been checked. UA concerning for continued infection. Once creatinine comes back we'll determine whether to place her on Cipro or Macrobid.

## 2017-10-07 NOTE — Assessment & Plan Note (Signed)
Stable. Most recent TSH normal.

## 2017-10-08 LAB — URINE CULTURE
MICRO NUMBER: 81232814
RESULT: NO GROWTH
SPECIMEN QUALITY:: ADEQUATE

## 2018-01-13 ENCOUNTER — Other Ambulatory Visit: Payer: Self-pay | Admitting: Family Medicine

## 2018-01-13 DIAGNOSIS — Z1231 Encounter for screening mammogram for malignant neoplasm of breast: Secondary | ICD-10-CM

## 2018-01-25 ENCOUNTER — Ambulatory Visit
Admission: RE | Admit: 2018-01-25 | Discharge: 2018-01-25 | Disposition: A | Discharge status: Home / Self Care | Payer: Medicare HMO | Source: Ambulatory Visit | Attending: Family Medicine | Admitting: Family Medicine

## 2018-01-25 DIAGNOSIS — Z1231 Encounter for screening mammogram for malignant neoplasm of breast: Secondary | ICD-10-CM

## 2018-01-31 ENCOUNTER — Telehealth: Payer: Self-pay | Admitting: Family Medicine

## 2018-01-31 MED ORDER — LEVOTHYROXINE SODIUM 112 MCG PO TABS: ORAL_TABLET | ORAL | 3 refills | Status: DC

## 2018-01-31 MED ORDER — TRAZODONE HCL 50 MG PO TABS: 50.0000 mg | ORAL_TABLET | Freq: Every evening | ORAL | 3 refills | Status: DC | PRN

## 2018-01-31 NOTE — Telephone Encounter (Signed)
Copied from [place]. Topic: Quick Communication - Rx Refill/Question >> Jan 31, 2018  2:11 PM [name] wrote: Lisa Petersen HCL 50 mg Levothroxine sodium 112 mcg's  Pt is requesting her refills be done at:  [facility] [place] [address] Phone: [phone] Fax: [phone]

## 2018-01-31 NOTE — Telephone Encounter (Signed)
Refills sent as requested

## 2018-01-31 NOTE — Telephone Encounter (Signed)
Copied from [place]. Topic: General - Other >> Jan 31, 2018  2:14 PM [name] wrote: Pt wants to know what she will need to do to have a DNR and Parking Tag.   Please give pt a call.  If no answer, please leave a message.

## 2018-03-08 ENCOUNTER — Ambulatory Visit: Payer: Self-pay | Admitting: *Deleted

## 2018-03-08 NOTE — Telephone Encounter (Signed)
Returned her call.   See triage notes below.  I made an appt with Dr. Caryl Bis for 03/09/18 at 11:30am. Reason for Disposition . [1] Nasal discharge AND [2] present > 10 days  Answer Assessment - Initial Assessment Questions 1. ONSET: "When did the nasal discharge start?"      Started on Monday a week ago. 2. AMOUNT: "How much discharge is there?"      Blowing my nose a lot.   Mucus is yellow.   My nose is also bleeding between periodically. 3. COUGH: "Do you have a cough?" If yes, ask: "Describe the color of your sputum" (clear, white, yellow, green)     Yes.   Sometimes it comes up and it's yellow. 4. RESPIRATORY DISTRESS: "Describe your breathing."      No shortness of breath 5. FEVER: "Do you have a fever?" If so, ask: "What is your temperature, how was it measured, and when did it start?"     No 6. SEVERITY: "Overall, how bad are you feeling right now?" (e.g., doesn't interfere with normal activities, staying home from school/work, staying in bed)      I feel "not up to snuff".    I have a headache but no body aches. 7. OTHER SYMPTOMS: "Do you have any other symptoms?" (e.g., sore throat, earache, wheezing, vomiting)     My throat is irritated but no earache or N/V/D 8. PREGNANCY: "Is there any chance you are pregnant?" "When was your last menstrual period?"     Not asked due to age  Protocols used: COMMON COLD-A-AH

## 2018-03-09 ENCOUNTER — Other Ambulatory Visit: Payer: Self-pay

## 2018-03-09 ENCOUNTER — Ambulatory Visit (INDEPENDENT_AMBULATORY_CARE_PROVIDER_SITE_OTHER): Payer: Medicare HMO | Admitting: Family Medicine

## 2018-03-09 ENCOUNTER — Encounter: Payer: Self-pay | Admitting: Family Medicine

## 2018-03-09 DIAGNOSIS — R030 Elevated blood-pressure reading, without diagnosis of hypertension: Secondary | ICD-10-CM | POA: Diagnosis not present

## 2018-03-09 DIAGNOSIS — M19041 Primary osteoarthritis, right hand: Secondary | ICD-10-CM

## 2018-03-09 DIAGNOSIS — J0100 Acute maxillary sinusitis, unspecified: Secondary | ICD-10-CM

## 2018-03-09 DIAGNOSIS — J329 Chronic sinusitis, unspecified: Secondary | ICD-10-CM | POA: Insufficient documentation

## 2018-03-09 DIAGNOSIS — M19042 Primary osteoarthritis, left hand: Secondary | ICD-10-CM

## 2018-03-09 MED ORDER — DOXYCYCLINE HYCLATE 100 MG PO TABS: 100.0000 mg | ORAL_TABLET | Freq: Two times a day (BID) | ORAL | 0 refills | Status: DC

## 2018-03-09 NOTE — Progress Notes (Signed)
Tommi Rumps, MD Phone: [phone]  Lisa Petersen is a 82 y.o. female who presents today for follow-up.  Patient notes 10 days of sore throat, postnasal drip, sinus congestion, and a dull headache.  Notes her sinuses feel full.  She is blowing bloody/colored mucus out of her nose.  It is thick.  No fevers.  No shortness of breath.  No sick contacts.  She tried over-the-counter medications with little benefit.  She reports chronic issues with her left knee that have been going on since she had her right knee replaced.  Notes she takes Osteo Bi-Flex with good benefit.  If she does not take it she will have some pain every other day.  She does have to use a cane at times.  It is at times difficult for her to walk or than 200 feet when parking her car due to discomfort.  She reports her husband passed away in 01-21-23.  He had been sick and had dementia for the last several years.  She has been dealing with this fairly well.  She has great support from her family.  Social History   Tobacco Use  Smoking Status Never Smoker  Smokeless Tobacco Never Used     ROS see history of present illness  Objective  Physical Exam Vitals:   03/09/18 1122  BP: (!) 150/86  Pulse: (!) 59  Temp: 98.4 F (36.9 C)  SpO2: 96%    BP Readings from Last 3 Encounters:  03/09/18 (!) 150/86  10/07/17 118/82  04/06/17 134/88   Wt Readings from Last 3 Encounters:  03/09/18 140 lb 12.8 oz (63.9 kg)  10/07/17 149 lb 12.8 oz (67.9 kg)  04/06/17 150 lb 12.8 oz (68.4 kg)    Physical Exam  Constitutional: No distress.  HENT:  Head: Normocephalic and atraumatic.  Mouth/Throat: Oropharynx is clear and moist. No oropharyngeal exudate.  Normal TMs  Eyes: Pupils are equal, round, and reactive to light. Conjunctivae are normal.  Neck: Neck supple.  Cardiovascular: Normal rate, regular rhythm and normal heart sounds.  Pulmonary/Chest: Effort normal and breath sounds normal.  Musculoskeletal: She  exhibits no edema.  Left knee with minimal tenderness along bilateral joint lines, no warmth, erythema, or swelling, no ligamentous laxity, negative McMurray's  Lymphadenopathy:    She has no cervical adenopathy.  Neurological: She is alert. Gait normal.  Skin: Skin is warm and dry. She is not diaphoretic.  Psychiatric: Mood normal.  Affect flat     Assessment/Plan: Please see individual problem list.  Sinusitis Symptoms consistent with sinusitis.  Will cover with doxycycline given Augmentin allergy.  Discussed probiotic or yogurt with this.  Discussed sun sensitivity with this.  She prefers over-the-counter cough medications.  Given return precautions.  Osteoarthritis Suspect osteoarthritis in her left knee.  Osteo Bi-Flex is beneficial.  She will continue this.  Handicap form given.  Borderline blood pressure Borderline blood pressure for age.  Has been well controlled.  Plan to recheck in May at her next visit.  Offered support following the death of her husband.  She noted no depression.  DNR forms provided to the patient.  I discussed what this meant with her.  No orders of the defined types were placed in this encounter.   Meds ordered this encounter  Medications  . doxycycline (VIBRA-TABS) 100 MG tablet    Sig: Take 1 tablet (100 mg total) by mouth 2 (two) times daily.    Dispense:  14 tablet    Refill:  0  Tommi Rumps, MD [place]

## 2018-03-09 NOTE — Assessment & Plan Note (Signed)
Suspect osteoarthritis in her left knee.  Osteo Bi-Flex is beneficial.  She will continue this.  Handicap form given.

## 2018-03-09 NOTE — Patient Instructions (Signed)
Nice to see you. We will treat with doxycycline for sinus infection.  If you are not improving in the next week please let us know. Please monitor your left knee and if it starts to bother you more please let us know. If you develop cough productive of blood or shortness of breath please be evaluated immediately.

## 2018-03-09 NOTE — Assessment & Plan Note (Signed)
Borderline blood pressure for age.  Has been well controlled.  Plan to recheck in May at her next visit.

## 2018-03-09 NOTE — Assessment & Plan Note (Signed)
Symptoms consistent with sinusitis.  Will cover with doxycycline given Augmentin allergy.  Discussed probiotic or yogurt with this.  Discussed sun sensitivity with this.  She prefers over-the-counter cough medications.  Given return precautions.

## 2018-03-21 DIAGNOSIS — R69 Illness, unspecified: Secondary | ICD-10-CM | POA: Diagnosis not present

## 2018-04-06 ENCOUNTER — Ambulatory Visit (INDEPENDENT_AMBULATORY_CARE_PROVIDER_SITE_OTHER): Payer: Medicare HMO | Admitting: Family Medicine

## 2018-04-06 ENCOUNTER — Encounter: Payer: Self-pay | Admitting: Family Medicine

## 2018-04-06 VITALS — BP 104/70 | HR 59 | Temp 98.4°F | Ht 66.0 in | Wt 142.0 lb

## 2018-04-06 DIAGNOSIS — F419 Anxiety disorder, unspecified: Secondary | ICD-10-CM

## 2018-04-06 DIAGNOSIS — Z85828 Personal history of other malignant neoplasm of skin: Secondary | ICD-10-CM

## 2018-04-06 DIAGNOSIS — F329 Major depressive disorder, single episode, unspecified: Secondary | ICD-10-CM

## 2018-04-06 DIAGNOSIS — E039 Hypothyroidism, unspecified: Secondary | ICD-10-CM | POA: Diagnosis not present

## 2018-04-06 DIAGNOSIS — F32A Depression, unspecified: Secondary | ICD-10-CM

## 2018-04-06 DIAGNOSIS — G4700 Insomnia, unspecified: Secondary | ICD-10-CM

## 2018-04-06 DIAGNOSIS — R69 Illness, unspecified: Secondary | ICD-10-CM | POA: Diagnosis not present

## 2018-04-06 DIAGNOSIS — R030 Elevated blood-pressure reading, without diagnosis of hypertension: Secondary | ICD-10-CM | POA: Diagnosis not present

## 2018-04-06 LAB — COMPREHENSIVE METABOLIC PANEL
ALT: 15 U/L (ref 0–35)
AST: 18 U/L (ref 0–37)
Albumin: 4.1 g/dL (ref 3.5–5.2)
Alkaline Phosphatase: 51 U/L (ref 39–117)
BUN: 15 mg/dL (ref 6–23)
CO2: 29 mEq/L (ref 19–32)
Calcium: 10 mg/dL (ref 8.4–10.5)
Chloride: 104 mEq/L (ref 96–112)
Creatinine, Ser: 0.9 mg/dL (ref 0.40–1.20)
GFR: 64.08 mL/min (ref >60.00)
Glucose, Bld: 89 mg/dL (ref 70–99)
Potassium: 4.6 mEq/L (ref 3.5–5.1)
Sodium: 139 mEq/L (ref 135–145)
Total Bilirubin: 0.4 mg/dL (ref 0.2–1.2)
Total Protein: 6.4 g/dL (ref 6.0–8.3)

## 2018-04-06 LAB — TSH: TSH: 1.24 u[IU]/mL (ref 0.35–4.50)

## 2018-04-06 NOTE — Assessment & Plan Note (Signed)
Asymptomatic.  She will continue to monitor.

## 2018-04-06 NOTE — Assessment & Plan Note (Signed)
she will continue to follow with dermatology.

## 2018-04-06 NOTE — Progress Notes (Signed)
  Tommi Rumps, MD Phone: [phone]  Lisa Petersen is a 82 y.o. female who presents today for f/u.  HYPOTHYROIDISM Disease Monitoring Weight changes: no  Skin Changes: no Heat/Cold intolerance: heat intolerance since she had thyroid ablation years ago  Medication Monitoring Compliance:  Taking synthroid   Last TSH:   Lab Results  Component Value Date   TSH 0.93 07/07/2017   Patient continues on trazodone for insomnia.  She tried coming off of this though could hardly sleep without it.  She gets about 6 hours with it.  She notes no caffeine intake in the afternoon.  No alcohol intake.  No anxiety or depression.  History of squamous cell carcinoma of the skin: She follows with dermatology yearly.  She notes no new spots.  No recent biopsies.  Social History   Tobacco Use  Smoking Status Never Smoker  Smokeless Tobacco Never Used     ROS see history of present illness  Objective  Physical Exam Vitals:   04/06/18 0955  BP: 104/70  Pulse: (!) 59  Temp: 98.4 F (36.9 C)  SpO2: 97%    BP Readings from Last 3 Encounters:  04/06/18 104/70  03/09/18 (!) 150/86  10/07/17 118/82   Wt Readings from Last 3 Encounters:  04/06/18 142 lb (64.4 kg)  03/09/18 140 lb 12.8 oz (63.9 kg)  10/07/17 149 lb 12.8 oz (67.9 kg)    Physical Exam  Constitutional: No distress.  Neck: Neck supple. No thyromegaly (no nodules) present.  Cardiovascular: Normal rate, regular rhythm and normal heart sounds.  Pulmonary/Chest: Effort normal and breath sounds normal.  Musculoskeletal: She exhibits no edema.  Neurological: She is alert.  Skin: Skin is warm and dry. She is not diaphoretic.     Assessment/Plan: Please see individual problem list.  Borderline blood pressure Well-controlled today.  Suspect prior elevation was related to illness.  Check CMP.  Anxiety and depression Asymptomatic.  She will continue to monitor.  Insomnia Adequately controlled with trazodone.  She  will continue this.  History of squamous cell carcinoma of skin  she will continue to follow with dermatology.  Hypothyroidism Continue Synthroid.  Check TSH.   Orders Placed This Encounter  Procedures  . TSH  . Comp Met (CMET)    No orders of the defined types were placed in this encounter.    Tommi Rumps, MD [place]

## 2018-04-06 NOTE — Assessment & Plan Note (Signed)
Well-controlled today.  Suspect prior elevation was related to illness.  Check CMP.

## 2018-04-06 NOTE — Patient Instructions (Signed)
Nice to see you. We will check lab work today and contact you with the results. You can continue the trazodone for sleep. Please continue to see dermatology for yearly checkups.

## 2018-04-06 NOTE — Assessment & Plan Note (Signed)
Continue Synthroid.  Check TSH. 

## 2018-04-06 NOTE — Assessment & Plan Note (Signed)
Adequately controlled with trazodone.  She will continue this.

## 2018-06-16 DIAGNOSIS — L570 Actinic keratosis: Secondary | ICD-10-CM | POA: Diagnosis not present

## 2018-06-29 ENCOUNTER — Encounter: Payer: Self-pay | Admitting: Family Medicine

## 2018-06-29 ENCOUNTER — Ambulatory Visit (INDEPENDENT_AMBULATORY_CARE_PROVIDER_SITE_OTHER): Payer: Medicare HMO | Admitting: Family Medicine

## 2018-06-29 VITALS — BP 122/82 | HR 59 | Temp 98.3°F | Resp 16 | Wt 141.1 lb

## 2018-06-29 DIAGNOSIS — J0100 Acute maxillary sinusitis, unspecified: Secondary | ICD-10-CM

## 2018-06-29 MED ORDER — FLUTICASONE PROPIONATE 50 MCG/ACT NA SUSP: 2.0000 | Freq: Every day | NASAL | 6 refills | Status: DC

## 2018-06-29 MED ORDER — DOXYCYCLINE HYCLATE 100 MG PO TABS
100.0000 mg | ORAL_TABLET | Freq: Two times a day (BID) | ORAL | 0 refills | Status: DC
Start: 2018-06-29 — End: 2018-08-31

## 2018-06-29 NOTE — Patient Instructions (Signed)
Please take medication as directed and avoid prolonged exposure to sunlight.  Please drink plenty of water enough for your urine to be pale yellow or clear.  Continue Allegra and you can add flonase for symptoms also.  Delsym for cough can also be used.   Follow-up for evaluation if your symptoms do not improve in 3-4 days, worsen, or you develop a fever greater than 100.    Sinusitis, Adult Sinusitis is soreness and inflammation of your sinuses. Sinuses are hollow spaces in the bones around your face. They are located:  Around your eyes.  In the middle of your forehead.  Behind your nose.  In your cheekbones.  Your sinuses and nasal passages are lined with a stringy fluid (mucus). Mucus normally drains out of your sinuses. When your nasal tissues get inflamed or swollen, the mucus can get trapped or blocked so air cannot flow through your sinuses. This lets bacteria, viruses, and funguses grow, and that leads to infection. Follow these instructions at home: Medicines  Take, use, or apply over-the-counter and prescription medicines only as told by your doctor. These may include nasal sprays.  If you were prescribed an antibiotic medicine, take it as told by your doctor. Do not stop taking the antibiotic even if you start to feel better. Hydrate and Humidify  Drink enough water to keep your pee (urine) clear or pale yellow.  Use a cool mist humidifier to keep the humidity level in your home above 50%.  Breathe in steam for 10-15 minutes, 3-4 times a day or as told by your doctor. You can do this in the bathroom while a hot shower is running.  Try not to spend time in cool or dry air. Rest  Rest as much as possible.  Sleep with your head raised (elevated).  Make sure to get enough sleep each night. General instructions  Put a warm, moist washcloth on your face 3-4 times a day or as told by your doctor. This will help with discomfort.  Wash your hands often with soap and  water. If there is no soap and water, use hand sanitizer.  Do not smoke. Avoid being around people who are smoking (secondhand smoke).  Keep all follow-up visits as told by your doctor. This is important. Contact a doctor if:  You have a fever.  Your symptoms get worse.  Your symptoms do not get better within 10 days. Get help right away if:  You have a very bad headache.  You cannot stop throwing up (vomiting).  You have pain or swelling around your face or eyes.  You have trouble seeing.  You feel confused.  Your neck is stiff.  You have trouble breathing. This information is not intended to replace advice given to you by your health care provider. Make sure you discuss any questions you have with your health care provider. Document Released: 05/10/2008 Document Revised: 07/18/2016 Document Reviewed: 09/17/2015 Elsevier Interactive Patient Education  [employer].

## 2018-06-29 NOTE — Progress Notes (Signed)
Patient ID: Lisa Petersen, female   DOB: 1936-10-16, 82 y.o.   MRN: [id number]  PCP: Leone Haven, MD  Subjective:  Lisa Petersen is a 82 y.o. year old very pleasant female patient who presents with Upper Respiratory infection symptoms including post nasal drip, cough that is productive of yellow/green sputum, nasal congestion, purulent nasal drainage  -started: over 2 weeks ago, symptoms are not  improving -previous treatments: Delsym has provided moderate benefit, Allegra in the morning has provided limited benefit -sick contacts/travel/risks: denies flu exposure. Denies recent sick contact exposure -Hx of: sinusitis  ROS-denies fever, SOB, NVD, tooth pain  Pertinent Past Medical History- Sinusitis, hypothyroidism  Medications- reviewed  Current Outpatient Medications  Medication Sig Dispense Refill  . Calcium Carbonate-Vitamin D (CALTRATE 600+D PO) Take by mouth daily.    Marland Kitchen levothyroxine (SYNTHROID, LEVOTHROID) 112 MCG tablet Take 1 tablet by mouth  daily before breakfast 90 tablet 3  . Misc Natural Products (OSTEO BI-FLEX JOINT SHIELD PO) Take 2 tablets by mouth daily.    . Multiple Vitamins-Minerals (CENTRUM SILVER PO) Take by mouth daily.    . traZODone (DESYREL) 50 MG tablet Take 1 tablet (50 mg total) by mouth at bedtime as needed for sleep. 90 tablet 3  . vitamin B-12 (CYANOCOBALAMIN) 1000 MCG tablet Take 1,000 mcg by mouth daily.     No current facility-administered medications for this visit.     Objective: BP 122/82 (BP Location: Left Arm, Patient Position: Sitting, Cuff Size: Normal)   Pulse (!) 59   Temp 98.3 F (36.8 C) (Oral)   Resp 16   Wt 141 lb 2 oz (64 kg)   SpO2 98%   BMI 22.78 kg/m  Gen: NAD, resting comfortably HEENT: Turbinates erythematous, TMs normal bilaterally , post nasal drip present, no erythema or exudate in oropharynx, + maxillary  sinus tenderness, no frontal sinus tenderness present CV: RRR no murmurs rubs or gallops Lungs: CTAB no  crackles, wheeze, rhonchi Abdomen: soft/nontender/nondistended/normal bowel sounds. No rebound or guarding.  Ext: no edema Skin: warm, dry, no rash Neuro: grossly normal, moves all extremities  Assessment/Plan:  1. Acute maxillary sinusitis, recurrence not specified Exam and history support empiric treatment for bacterial respiratory infection. Will treat with doxycycline; Allergy to Augmentin noted. Reviewed photosensitivity that can occur with patient. Provided flonase for symptoms of post nasal drip; Allegra for symptoms and also suggested nasal saline rinses however she is not interested in nasal rinses at this time.  Advised patient on supportive measures:  Get rest, drink plenty of fluids, and follow up if fever >101, if symptoms worsen or if symptoms are not improved in 3-4 days. Patient  verbalizes understanding and agrees with plan.  - doxycycline (VIBRA-TABS) 100 MG tablet; Take 1 tablet (100 mg total) by mouth 2 (two) times daily.  Dispense: 20 tablet; Refill: 0 - fluticasone (FLONASE) 50 MCG/ACT nasal spray; Place 2 sprays into both nostrils daily.  Dispense: 16 g; Refill: 6   Finally, we reviewed reasons to return to care including if symptoms worsen or persist or new concerns arise- once again particularly increasing sputum, shortness of breath or fever.  Laurita Quint, FNP

## 2018-08-14 DIAGNOSIS — L720 Epidermal cyst: Secondary | ICD-10-CM | POA: Diagnosis not present

## 2018-08-31 ENCOUNTER — Ambulatory Visit (INDEPENDENT_AMBULATORY_CARE_PROVIDER_SITE_OTHER): Payer: Medicare HMO

## 2018-08-31 VITALS — BP 120/70 | HR 60 | Temp 98.3°F | Resp 15 | Ht 67.0 in | Wt 143.8 lb

## 2018-08-31 DIAGNOSIS — Z23 Encounter for immunization: Secondary | ICD-10-CM

## 2018-08-31 DIAGNOSIS — Z0000 Encounter for general adult medical examination without abnormal findings: Secondary | ICD-10-CM

## 2018-08-31 NOTE — Progress Notes (Addendum)
Subjective:   Lisa Petersen is a 82 y.o. female who presents for Medicare Annual (Subsequent) preventive examination.  Review of Systems:  No ROS.  Medicare Wellness Visit. Additional risk factors are reflected in the social history.  Cardiac Risk Factors include: advanced age (>34men, >56 women)     Objective:     Vitals: BP 120/70 (BP Location: Left Arm, Patient Position: Sitting, Cuff Size: Normal)   Pulse 60   Temp 98.3 F (36.8 C) (Oral)   Resp 15   Ht 5\' 7"  (1.702 m)   SpO2 96%   BMI 22.10 kg/m   Body mass index is 22.1 kg/m.  Advanced Directives 08/31/2018  Does Patient Have a Medical Advance Directive? Yes  Type of Paramedic of [place];Living will  Does patient want to make changes to medical advance directive? No - Patient declined  Copy of [place] in Chart? Yes    Tobacco Social History   Tobacco Use  Smoking Status Never Smoker  Smokeless Tobacco Never Used     Counseling given: Not Answered   Clinical Intake:  Pre-visit preparation completed: Yes  Pain : No/denies pain     Nutritional Status: BMI of 19-24  Normal Diabetes: No  How often do you need to have someone help you when you read instructions, pamphlets, or other written materials from your doctor or pharmacy?: 1 - Never  Interpreter Needed?: No     Past Medical History:  Diagnosis Date  . Allergy   . Arthritis   . Colon polyps   . Thyroid disease   . UTI (lower urinary tract infection)    Past Surgical History:  Procedure Laterality Date  . ABDOMINAL HYSTERECTOMY  1975  . CATARACT EXTRACTION Bilateral 2009  . COLECTOMY Right 2006  . REPLACEMENT TOTAL KNEE Right 2011  . TONSILLECTOMY AND ADENOIDECTOMY  1950   Family History  Problem Relation Age of Onset  . Hypertension Mother   . Hypertension Sister   . Mental illness Sister   . Dementia Sister   . Diabetes Brother   . Cancer Brother        Kidney/Bladder  .  Dementia Father   . Parkinson's disease Brother   . Breast cancer Neg Hx    Social History   Socioeconomic History  . Marital status: Widowed    Spouse name: Not on file  . Number of children: Not on file  . Years of education: Not on file  . Highest education level: Not on file  Occupational History  . Not on file  Social Needs  . Financial resource strain: Not hard at all  . Food insecurity:    Worry: Never true    Inability: Never true  . Transportation needs:    Medical: No    Non-medical: No  Tobacco Use  . Smoking status: Never Smoker  . Smokeless tobacco: Never Used  Substance and Sexual Activity  . Alcohol use: No  . Drug use: No  . Sexual activity: Yes    Birth control/protection: None  Lifestyle  . Physical activity:    Days per week: 0 days    Minutes per session: Not on file  . Stress: Not on file  Relationships  . Social connections:    Talks on phone: Not on file    Gets together: Not on file    Attends religious service: Not on file    Active member of club or organization: Not on file  Attends meetings of clubs or organizations: Not on file    Relationship status: Not on file  Other Topics Concern  . Not on file  Social History Narrative   Has a living will scanned into chart.    Outpatient Encounter Medications as of 08/31/2018  Medication Sig  . Calcium Carbonate-Vitamin D (CALTRATE 600+D PO) Take by mouth daily.  Marland Kitchen levothyroxine (SYNTHROID, LEVOTHROID) 112 MCG tablet Take 1 tablet by mouth  daily before breakfast  . Misc Natural Products (OSTEO BI-FLEX JOINT SHIELD PO) Take 2 tablets by mouth daily.  . Multiple Vitamins-Minerals (CENTRUM SILVER PO) Take by mouth daily.  . traZODone (DESYREL) 50 MG tablet Take 1 tablet (50 mg total) by mouth at bedtime as needed for sleep.  . vitamin B-12 (CYANOCOBALAMIN) 1000 MCG tablet Take 1,000 mcg by mouth daily.  . [DISCONTINUED] doxycycline (VIBRA-TABS) 100 MG tablet Take 1 tablet (100 mg total) by  mouth 2 (two) times daily.  . fluticasone (FLONASE) 50 MCG/ACT nasal spray Place 2 sprays into both nostrils daily. (Patient not taking: Reported on 08/31/2018)   No facility-administered encounter medications on file as of 08/31/2018.     Activities of Daily Living In your present state of health, do you have any difficulty performing the following activities: 08/31/2018  Hearing? N  Vision? N  Difficulty concentrating or making decisions? N  Walking or climbing stairs? N  Dressing or bathing? N  Doing errands, shopping? N  Preparing Food and eating ? N  Using the Toilet? N  In the past six months, have you accidently leaked urine? N  Do you have problems with loss of bowel control? N  Managing your Medications? N  Managing your Finances? N  Housekeeping or managing your Housekeeping? N  Some recent data might be hidden    Patient Care Team: Leone Haven, MD as PCP - General (Family Medicine) Leonie Green, MD as Referring Physician (Surgery) Dasher, Rayvon Char, MD as Attending Physician (Dermatology) Birder Robson, MD as Referring Physician (Ophthalmology)    Assessment:   This is a routine wellness examination for [place].  The goal of the wellness visit is to assist the patient how to close the gaps in care and create a preventative care plan for the patient.   The roster of all physicians providing medical care to patient is listed in the Snapshot section of the chart.  Osteoarthritis. Taking calcium VIT D as appropriate/Osteoporosis risk reviewed.    Safety issues reviewed; Lives alone. Smoke and carbon monoxide detectors in the home. No firearms in the home. Wears seatbelts when driving or riding with others. No violence in the home.  They do not have excessive sun exposure.  Discussed the need for sun protection: hats, long sleeves and the use of sunscreen if there is significant sun exposure.  Patient is alert, normal appearance, oriented to  person/place/and time. Correctly identified the president of the [place] and recalls of 3/3 words.Performs simple calculations and can read correct time from watch face.  Displays appropriate judgement.  No new identified risk were noted.  No failures at ADL's or IADL's.    BMI- discussed the importance of a healthy diet, water intake and the benefits of aerobic exercise. Educational material provided.   24 hour diet recall: Regular diet  Dental- every 6 months.  Sleep patterns- Sleeps through the night without issues. Taking trazodone as directed. Wakes feeling rested.   High dose influenza vaccine administered L deltoid, tolerated well. No verbal complaint during or  after administration. Educational material provided.  TDAP vaccine deferred per patient preference.  Follow up with insurance.  Educational material provided.  Requests full panel future labs. Pcp notified. Appointment scheduled.   Exercise Activities and Dietary recommendations Current Exercise Habits: The patient does not participate in regular exercise at present  Goals    . Increase physical activity     Use stationary bike for exercise [employer]ne[employer] Program       Fall Risk Fall Risk  08/31/2018 04/06/2018 04/06/2017 06/17/2015  Falls in the past year? No No No No   Depression Screen PHQ 2/9 Scores 08/31/2018 04/06/2017 04/06/2017 06/18/2016  PHQ - 2 Score 0 0 0 0  PHQ- 9 Score - 1 - -     Cognitive Function MMSE - Mini Mental State Exam 08/31/2018  Orientation to time 5  Orientation to Place 5  Registration 3  Attention/ Calculation 5  Recall 3  Language- name 2 objects 2  Language- repeat 1  Language- follow 3 step command 3  Language- read & follow direction 1  Write a sentence 1  Copy design 1  Total score 30        Immunization History  Administered Date(s) Administered  . Influenza, High Dose Seasonal PF 08/31/2018  . Influenza-Unspecified 08/06/2014, 08/25/2015, 09/02/2016  . Pneumococcal  Conjugate-13 06/17/2015  . Pneumococcal Polysaccharide-23 05/29/2009  . Td 12/05/2006  . Zoster 12/21/2007   Screening Tests Health Maintenance  Topic Date Due  . TETANUS/TDAP  12/05/2016  . INFLUENZA VACCINE  07/06/2018  . DEXA SCAN  Completed  . PNA vac Low Risk Adult  Completed      Plan:   End of life planning; Advance aging; Advanced directives discussed. Copy of current HCPOA/Living Will on file.    I have personally reviewed and noted the following in the patient's chart:   . Medical and social history . Use of alcohol, tobacco or illicit drugs  . Current medications and supplements . Functional ability and status . Nutritional status . Physical activity . Advanced directives . List of other physicians . Hospitalizations, surgeries, and ER visits in previous 12 months . Vitals . Screenings to include cognitive, depression, and falls . Referrals and appointments  In addition, I have reviewed and discussed with patient certain preventive protocols, quality metrics, and best practice recommendations. A written personalized care plan for preventive services as well as general preventive health recommendations were provided to patient.     OBrien-Blaney, Alfreida Steffenhagen L, LPN  05/27/2978    I have reviewed the above information and agree with above.   Deborra Medina, MD

## 2018-08-31 NOTE — Patient Instructions (Addendum)
  Lisa Petersen , Thank you for taking time to come for your Medicare Wellness Visit. I appreciate your ongoing commitment to your health goals. Please review the following plan we discussed and let me know if I can assist you in the future.   Follow up as needed.    Have a great day! These are the goals we discussed: Goals    . Increase physical activity     Use stationary bike for exercise [employer] Program       This is a list of the screening recommended for you and due dates:  Health Maintenance  Topic Date Due  . Tetanus Vaccine  12/05/2016  . Flu Shot  07/06/2018  . DEXA scan (bone density measurement)  Completed  . Pneumonia vaccines  Completed

## 2018-09-05 ENCOUNTER — Other Ambulatory Visit: Payer: Self-pay | Admitting: Family Medicine

## 2018-09-05 DIAGNOSIS — Z130 Encounter for screening for diseases of the blood and blood-forming organs and certain disorders involving the immune mechanism: Secondary | ICD-10-CM

## 2018-09-05 DIAGNOSIS — Z13220 Encounter for screening for lipoid disorders: Secondary | ICD-10-CM

## 2018-09-05 DIAGNOSIS — E039 Hypothyroidism, unspecified: Secondary | ICD-10-CM

## 2018-10-20 DIAGNOSIS — H52223 Regular astigmatism, bilateral: Secondary | ICD-10-CM | POA: Diagnosis not present

## 2018-10-20 DIAGNOSIS — H353132 Nonexudative age-related macular degeneration, bilateral, intermediate dry stage: Secondary | ICD-10-CM | POA: Diagnosis not present

## 2018-10-20 DIAGNOSIS — Z9841 Cataract extraction status, right eye: Secondary | ICD-10-CM | POA: Diagnosis not present

## 2018-10-20 DIAGNOSIS — L0291 Cutaneous abscess, unspecified: Secondary | ICD-10-CM | POA: Diagnosis not present

## 2018-10-20 DIAGNOSIS — H5203 Hypermetropia, bilateral: Secondary | ICD-10-CM | POA: Diagnosis not present

## 2018-10-20 DIAGNOSIS — Z9842 Cataract extraction status, left eye: Secondary | ICD-10-CM | POA: Diagnosis not present

## 2018-10-31 ENCOUNTER — Other Ambulatory Visit (INDEPENDENT_AMBULATORY_CARE_PROVIDER_SITE_OTHER): Payer: Medicare HMO

## 2018-10-31 DIAGNOSIS — Z13220 Encounter for screening for lipoid disorders: Secondary | ICD-10-CM

## 2018-10-31 DIAGNOSIS — Z130 Encounter for screening for diseases of the blood and blood-forming organs and certain disorders involving the immune mechanism: Secondary | ICD-10-CM | POA: Diagnosis not present

## 2018-10-31 DIAGNOSIS — E039 Hypothyroidism, unspecified: Secondary | ICD-10-CM | POA: Diagnosis not present

## 2018-10-31 LAB — TSH: TSH: 0.7 u[IU]/mL (ref 0.35–4.50)

## 2018-10-31 LAB — CBC
HEMATOCRIT: 40.7 % (ref 36.0–46.0)
HEMOGLOBIN: 13.7 g/dL (ref 12.0–15.0)
MCHC: 33.8 g/dL (ref 30.0–36.0)
MCV: 91 fl (ref 78.0–100.0)
PLATELETS: 218 10*3/uL (ref 150.0–400.0)
RBC: 4.47 Mil/uL (ref 3.87–5.11)
RDW: 13 % (ref 11.5–15.5)
WBC: 4.9 10*3/uL (ref 4.0–10.5)

## 2018-10-31 LAB — COMPREHENSIVE METABOLIC PANEL
ALBUMIN: 4.1 g/dL (ref 3.5–5.2)
ALK PHOS: 45 U/L (ref 39–117)
ALT: 15 U/L (ref 0–35)
AST: 16 U/L (ref 0–37)
BILIRUBIN TOTAL: 0.5 mg/dL (ref 0.2–1.2)
BUN: 17 mg/dL (ref 6–23)
CO2: 27 mEq/L (ref 19–32)
Calcium: 9.1 mg/dL (ref 8.4–10.5)
Chloride: 105 mEq/L (ref 96–112)
Creatinine, Ser: 0.88 mg/dL (ref 0.40–1.20)
GFR: 65.67 mL/min (ref >60.00)
Glucose, Bld: 100 mg/dL — ABNORMAL HIGH (ref 70–99)
Potassium: 4.4 mEq/L (ref 3.5–5.1)
SODIUM: 138 meq/L (ref 135–145)
Total Protein: 6.5 g/dL (ref 6.0–8.3)

## 2018-10-31 LAB — LIPID PANEL
CHOL/HDL RATIO: 4
CHOLESTEROL: 177 mg/dL (ref 0–200)
HDL: 49 mg/dL (ref >39.00)
LDL CALC: 110 mg/dL — AB (ref 0–99)
NonHDL: 127.78
TRIGLYCERIDES: 90 mg/dL (ref 0.0–149.0)
VLDL: 18 mg/dL (ref 0.0–40.0)

## 2018-10-31 LAB — T4, FREE: Free T4: 1.07 ng/dL (ref 0.60–1.60)

## 2018-12-12 ENCOUNTER — Other Ambulatory Visit: Payer: Self-pay | Admitting: Family Medicine

## 2018-12-12 DIAGNOSIS — Z1231 Encounter for screening mammogram for malignant neoplasm of breast: Secondary | ICD-10-CM

## 2019-01-26 ENCOUNTER — Ambulatory Visit
Admission: RE | Admit: 2019-01-26 | Discharge: 2019-01-26 | Disposition: A | Discharge status: Home / Self Care | Payer: Medicare HMO | Source: Ambulatory Visit | Attending: Family Medicine | Admitting: Family Medicine

## 2019-01-26 DIAGNOSIS — Z1231 Encounter for screening mammogram for malignant neoplasm of breast: Secondary | ICD-10-CM | POA: Diagnosis not present

## 2019-01-30 ENCOUNTER — Telehealth: Payer: Self-pay | Admitting: Family Medicine

## 2019-01-30 NOTE — Telephone Encounter (Signed)
Copied from [place] [id number]. Topic: Quick Communication - See Telephone Encounter >> Jan 30, 2019 11:32 AM Bea Graff, NT wrote: CRM for notification. See Telephone encounter for: 01/30/19. Pt would like a call with her mammogram results.

## 2019-01-30 NOTE — Telephone Encounter (Signed)
Called and spoke with the pt and informed her that [facility] had ailed a letter to her about her mammogram results, I read the letter to her stating her mammogram was normal and informed her the letter should reach her soon, pt understood.  Lesia Hausen

## 2019-02-20 ENCOUNTER — Telehealth: Payer: Self-pay | Admitting: Family Medicine

## 2019-02-20 ENCOUNTER — Other Ambulatory Visit: Payer: Self-pay

## 2019-02-20 DIAGNOSIS — J0100 Acute maxillary sinusitis, unspecified: Secondary | ICD-10-CM

## 2019-02-20 MED ORDER — TRAZODONE HCL 50 MG PO TABS: 50.0000 mg | ORAL_TABLET | Freq: Every evening | ORAL | 0 refills | Status: DC | PRN

## 2019-02-20 MED ORDER — FLUTICASONE PROPIONATE 50 MCG/ACT NA SUSP: 2.0000 | Freq: Every day | NASAL | 6 refills | Status: DC

## 2019-02-20 MED ORDER — LEVOTHYROXINE SODIUM 112 MCG PO TABS: ORAL_TABLET | ORAL | 3 refills | Status: DC

## 2019-02-20 NOTE — Telephone Encounter (Signed)
Called and spoke with pt. Pt advised and voiced understanding.  

## 2019-02-20 NOTE — Telephone Encounter (Signed)
Trazodone sent to pharmacy to cover until patient comes in for follow-up in May.

## 2019-02-20 NOTE — Telephone Encounter (Signed)
Called and spoke with pt. Pt stated that she did not need a refill on flonase but she needed it for trazodone.   Pt is aware PCP is out of the office and is OK with waiting until PCP returns.   Last refilled 01/31/2018 disp 90 with 3 refill   Sent to PCP send to [facility]

## 2019-02-20 NOTE — Telephone Encounter (Signed)
Copied from [place] [id number]. Topic: Quick Communication - Rx Refill/Question >> Feb 20, 2019  8:33 AM Bea Graff, NT wrote: Medication: levothyroxine (SYNTHROID, LEVOTHROID) 112 MCG tablet and fluticasone (FLONASE) 50 MCG/ACT nasal spray   Has the patient contacted their pharmacy? Yes.   (Agent: If no, request that the patient contact the pharmacy for the refill.) (Agent: If yes, when and what did the pharmacy advise?)  Preferred Pharmacy (with phone number or street name): [place], [place] [phone] (Phone) [phone] (Fax)    Agent: Please be advised that RX refills may take up to 3 business days. We ask that you follow-up with your pharmacy.

## 2019-04-09 ENCOUNTER — Ambulatory Visit: Payer: Medicare HMO | Admitting: Family Medicine

## 2019-05-14 DIAGNOSIS — R69 Illness, unspecified: Secondary | ICD-10-CM | POA: Diagnosis not present

## 2019-06-07 ENCOUNTER — Other Ambulatory Visit: Payer: Self-pay

## 2019-06-07 ENCOUNTER — Ambulatory Visit (INDEPENDENT_AMBULATORY_CARE_PROVIDER_SITE_OTHER): Payer: Medicare HMO | Admitting: Internal Medicine

## 2019-06-07 DIAGNOSIS — N644 Mastodynia: Secondary | ICD-10-CM

## 2019-06-07 DIAGNOSIS — R634 Abnormal weight loss: Secondary | ICD-10-CM | POA: Diagnosis not present

## 2019-06-07 DIAGNOSIS — R590 Localized enlarged lymph nodes: Secondary | ICD-10-CM

## 2019-06-07 NOTE — Progress Notes (Signed)
Telephone Note  I connected with Lisa Petersen  on 06/07/19 at 11:30 AM EDT by a telephone and verified that I am speaking with the correct person using two identifiers.  Location patient: home Location provider:work  Persons participating in the virtual visit: patient, provider  I discussed the limitations of evaluation and management by telemedicine and the availability of in person appointments. The patient expressed understanding and agreed to proceed.   HPI: 1. Left breast tenderness at 3 oclock and knot under left arm x 2-3 months. Last night she felt  "knot" under left underarm like a "twing". She reports since her husband died she went from weight of 175 to 140 lbs and her breasts are more saggy. She lost weight due to change in lifestyle and loss of her husband but does not want to gain the weight back and since weight loss her weight has been stable     ROS: See pertinent positives and negatives per HPI.  Past Medical History:  Diagnosis Date  . Allergy   . Arthritis   . Colon polyps   . Thyroid disease   . UTI (lower urinary tract infection)     Past Surgical History:  Procedure Laterality Date  . ABDOMINAL HYSTERECTOMY  1975  . CATARACT EXTRACTION Bilateral 2009  . COLECTOMY Right 2006  . REPLACEMENT TOTAL KNEE Right 2011  . TONSILLECTOMY AND ADENOIDECTOMY  1950    Family History  Problem Relation Age of Onset  . Hypertension Mother   . Hypertension Sister   . Mental illness Sister   . Dementia Sister   . Diabetes Brother   . Cancer Brother        Kidney/Bladder  . Dementia Father   . Parkinson's disease Brother   . Breast cancer Neg Hx     SOCIAL HX: widowed    Current Outpatient Medications:  .  Calcium Carbonate-Vitamin D (CALTRATE 600+D PO), Take by mouth daily., Disp: , Rfl:  .  fluticasone (FLONASE) 50 MCG/ACT nasal spray, Place 2 sprays into both nostrils daily., Disp: 16 g, Rfl: 6 .  levothyroxine (SYNTHROID, LEVOTHROID) 112 MCG tablet,  Take 1 tablet by mouth  daily before breakfast, Disp: 90 tablet, Rfl: 3 .  Misc Natural Products (OSTEO BI-FLEX JOINT SHIELD PO), Take 2 tablets by mouth daily., Disp: , Rfl:  .  Multiple Vitamins-Minerals (CENTRUM SILVER PO), Take by mouth daily., Disp: , Rfl:  .  traZODone (DESYREL) 50 MG tablet, Take 1 tablet (50 mg total) by mouth at bedtime as needed for sleep., Disp: 90 tablet, Rfl: 0 .  vitamin B-12 (CYANOCOBALAMIN) 1000 MCG tablet, Take 1,000 mcg by mouth daily., Disp: , Rfl:   EXAM:  VITALS per patient if applicable:  GENERAL: alert, oriented, appears well and in no acute distress  PSYCH/NEURO: pleasant and cooperative, no obvious depression or anxiety, speech and thought processing grossly intact  ASSESSMENT AND PLAN:  Discussed the following assessment and plan:  Breast pain, left - Plan: MM DIAG BREAST TOMO UNI LEFT, US BREAST LTD UNI LEFT INC AXILLA to view at 3 Oclock   Lymphadenopathy, axillary - Plan: MM DIAG BREAST TOMO UNI LEFT, US BREAST LTD UNI LEFT INC AXILLA  Weight loss seems stable now 140 from 175 per pt she reports due to lifestyle changes  Further issues f/u PCP for further w/u weight loss if indicated pt does not currently seem concerned as weight is stable     I discussed the assessment and treatment plan with the patient.  The patient was provided an opportunity to ask questions and all were answered. The patient agreed with the plan and demonstrated an understanding of the instructions.   The patient was advised to call back or seek an in-person evaluation if the symptoms worsen or if the condition fails to improve as anticipated.  Time spent 15 minutes  Delorise Jackson, MD

## 2019-06-15 ENCOUNTER — Ambulatory Visit
Admission: RE | Admit: 2019-06-15 | Discharge: 2019-06-15 | Disposition: A | Discharge status: Home / Self Care | Payer: Medicare HMO | Source: Ambulatory Visit | Attending: Internal Medicine | Admitting: Internal Medicine

## 2019-06-15 ENCOUNTER — Other Ambulatory Visit: Payer: Self-pay

## 2019-06-15 ENCOUNTER — Other Ambulatory Visit: Payer: Self-pay | Admitting: Family Medicine

## 2019-06-15 DIAGNOSIS — N6459 Other signs and symptoms in breast: Secondary | ICD-10-CM | POA: Diagnosis not present

## 2019-06-15 DIAGNOSIS — R590 Localized enlarged lymph nodes: Secondary | ICD-10-CM | POA: Diagnosis not present

## 2019-06-15 DIAGNOSIS — N644 Mastodynia: Secondary | ICD-10-CM

## 2019-06-15 DIAGNOSIS — R922 Inconclusive mammogram: Secondary | ICD-10-CM | POA: Diagnosis not present

## 2019-06-15 NOTE — Telephone Encounter (Signed)
Last Refill:  02/20/19  Last OV:  04/06/18  Next OV:  07/16/19

## 2019-07-12 ENCOUNTER — Other Ambulatory Visit: Payer: Self-pay

## 2019-07-12 DIAGNOSIS — H353132 Nonexudative age-related macular degeneration, bilateral, intermediate dry stage: Secondary | ICD-10-CM | POA: Diagnosis not present

## 2019-07-12 DIAGNOSIS — H52223 Regular astigmatism, bilateral: Secondary | ICD-10-CM | POA: Diagnosis not present

## 2019-07-12 DIAGNOSIS — Z9841 Cataract extraction status, right eye: Secondary | ICD-10-CM | POA: Diagnosis not present

## 2019-07-12 DIAGNOSIS — Z9842 Cataract extraction status, left eye: Secondary | ICD-10-CM | POA: Diagnosis not present

## 2019-07-12 DIAGNOSIS — H5203 Hypermetropia, bilateral: Secondary | ICD-10-CM | POA: Diagnosis not present

## 2019-07-16 ENCOUNTER — Other Ambulatory Visit: Payer: Self-pay

## 2019-07-16 ENCOUNTER — Encounter: Payer: Self-pay | Admitting: Family Medicine

## 2019-07-16 ENCOUNTER — Ambulatory Visit (INDEPENDENT_AMBULATORY_CARE_PROVIDER_SITE_OTHER): Payer: Medicare HMO | Admitting: Family Medicine

## 2019-07-16 VITALS — BP 130/78 | HR 60 | Temp 97.6°F | Ht 67.0 in | Wt 143.0 lb

## 2019-07-16 DIAGNOSIS — G4700 Insomnia, unspecified: Secondary | ICD-10-CM

## 2019-07-16 DIAGNOSIS — E039 Hypothyroidism, unspecified: Secondary | ICD-10-CM | POA: Diagnosis not present

## 2019-07-16 DIAGNOSIS — R2230 Localized swelling, mass and lump, unspecified upper limb: Secondary | ICD-10-CM

## 2019-07-16 DIAGNOSIS — R222 Localized swelling, mass and lump, trunk: Secondary | ICD-10-CM

## 2019-07-16 DIAGNOSIS — Z0001 Encounter for general adult medical examination with abnormal findings: Secondary | ICD-10-CM

## 2019-07-16 LAB — CBC WITH DIFFERENTIAL/PLATELET
Basophils Absolute: 0 10*3/uL (ref 0.0–0.1)
Basophils Relative: 0.5 % (ref 0.0–3.0)
Eosinophils Absolute: 0.1 10*3/uL (ref 0.0–0.7)
Eosinophils Relative: 1.9 % (ref 0.0–5.0)
HCT: 39.1 % (ref 36.0–46.0)
Hemoglobin: 13 g/dL (ref 12.0–15.0)
Lymphocytes Relative: 24 % (ref 12.0–46.0)
Lymphs Abs: 1.3 10*3/uL (ref 0.7–4.0)
MCHC: 33.3 g/dL (ref 30.0–36.0)
MCV: 92 fl (ref 78.0–100.0)
Monocytes Absolute: 0.3 10*3/uL (ref 0.1–1.0)
Monocytes Relative: 6.4 % (ref 3.0–12.0)
Neutro Abs: 3.7 10*3/uL (ref 1.4–7.7)
Neutrophils Relative %: 67.2 % (ref 43.0–77.0)
Platelets: 208 10*3/uL (ref 150.0–400.0)
RBC: 4.25 Mil/uL (ref 3.87–5.11)
RDW: 13 % (ref 11.5–15.5)
WBC: 5.4 10*3/uL (ref 4.0–10.5)

## 2019-07-16 LAB — TSH: TSH: 0.59 u[IU]/mL (ref 0.35–4.50)

## 2019-07-16 MED ORDER — TRAZODONE HCL 50 MG PO TABS: ORAL_TABLET | ORAL | 1 refills | Status: DC

## 2019-07-16 NOTE — Patient Instructions (Signed)
Nice to see you. We will get lab work today. Once this returns we will call you and then determine the next step for your arm pit fullness.

## 2019-07-16 NOTE — Progress Notes (Signed)
Tommi Rumps, MD Phone: [phone]  Lisa Petersen is a 83 y.o. female who presents today for CPE.  Diet: She eats whatever is available to her.  She does have Diet Coke though she has cut back on this. She stays active with yard work though no specific exercise. Mammogram up-to-date.  She had some left axillary pain and was evaluated by diagnostic mammogram and ultrasound.  There were no abnormal lesions and they noted normal lymph nodes.  She continues to have a fullness in her left axilla.  Notes a burning sensation in the left axilla as well.  Has gotten slightly worse.  No injury.  No medications for this. She defers any further colon cancer screening. No family history of breast cancer, ovarian cancer, or colon cancer. Pneumonia vaccine and shingles vaccine up-to-date.  She reports tetanus vaccine 4 to 5 years ago.  She will get a flu shot when they are available. No tobacco use, alcohol use, or illicit drug use. Sees a dentist and an ophthalmologist. She reports she is due to see her dermatologist.  Active Ambulatory Problems    Diagnosis Date Noted  . Hypothyroidism 11/14/2014  . Insomnia 06/17/2015  . History of squamous cell carcinoma of skin 04/07/2016  . Anxiety and depression 05/21/2016  . Encounter for general adult medical examination with abnormal findings 10/07/2016  . Bilateral finger numbness 04/06/2017  . Osteoarthritis 04/06/2017  . Borderline blood pressure 03/09/2018  . Axillary fullness 07/19/2019   Resolved Ambulatory Problems    Diagnosis Date Noted  . Protracted upper respiratory infection 11/14/2014  . Right lower quadrant abdominal mass 11/14/2014  . Well woman exam 06/12/2015  . Medicare annual wellness visit, subsequent 06/17/2015  . Grief 06/17/2015  . Dizziness and giddiness 11/12/2015  . Cerumen impaction 11/12/2015  . UTI (urinary tract infection) 05/21/2016  . Sinusitis 03/09/2018   Past Medical History:  Diagnosis Date  . Allergy    . Arthritis   . Colon polyps   . Thyroid disease   . UTI (lower urinary tract infection)     Family History  Problem Relation Age of Onset  . Hypertension Mother   . Hypertension Sister   . Mental illness Sister   . Dementia Sister   . Diabetes Brother   . Cancer Brother        Kidney/Bladder  . Dementia Father   . Parkinson's disease Brother   . Breast cancer Neg Hx     Social History   Socioeconomic History  . Marital status: Widowed    Spouse name: Not on file  . Number of children: Not on file  . Years of education: Not on file  . Highest education level: Not on file  Occupational History  . Not on file  Social Needs  . Financial resource strain: Not hard at all  . Food insecurity    Worry: Never true    Inability: Never true  . Transportation needs    Medical: No    Non-medical: No  Tobacco Use  . Smoking status: Never Smoker  . Smokeless tobacco: Never Used  Substance and Sexual Activity  . Alcohol use: No  . Drug use: No  . Sexual activity: Yes    Birth control/protection: None  Lifestyle  . Physical activity    Days per week: 0 days    Minutes per session: Not on file  . Stress: Not on file  Relationships  . Social connections    Talks on phone: Not on file  Gets together: Not on file    Attends religious service: Not on file    Active member of club or organization: Not on file    Attends meetings of clubs or organizations: Not on file    Relationship status: Not on file  . Intimate partner violence    Fear of current or ex partner: Not on file    Emotionally abused: Not on file    Physically abused: Not on file    Forced sexual activity: Not on file  Other Topics Concern  . Not on file  Social History Narrative   Has a living will scanned into chart.    ROS  General:  Negative for nexplained weight loss, fever Skin: Negative for new or changing mole, sore that won't heal HEENT: Negative for trouble hearing, trouble seeing,  ringing in ears, mouth sores, hoarseness, change in voice, dysphagia. CV:  Negative for chest pain, dyspnea, edema, palpitations Resp: Negative for cough, dyspnea, hemoptysis GI: Negative for nausea, vomiting, diarrhea, constipation, abdominal pain, melena, hematochezia. GU: Negative for dysuria, incontinence, urinary hesitance, hematuria, vaginal or penile discharge, polyuria, sexual difficulty, lumps in testicle or breasts MSK: Negative for muscle cramps or aches, positive for joint pain or swelling Neuro: Negative for headaches, weakness, numbness, dizziness, passing out/fainting Psych: Negative for depression, anxiety, memory problems  Objective  Physical Exam Vitals:   07/16/19 0857  BP: 130/78  Pulse: 60  Temp: 97.6 F (36.4 C)  SpO2: 94%    BP Readings from Last 3 Encounters:  07/16/19 130/78  08/31/18 120/70  06/29/18 122/82   Wt Readings from Last 3 Encounters:  07/16/19 143 lb (64.9 kg)  08/31/18 143 lb 12.8 oz (65.2 kg)  06/29/18 141 lb 2 oz (64 kg)    Physical Exam Constitutional:      General: She is not in acute distress.    Appearance: She is not diaphoretic.  HENT:     Head: Normocephalic and atraumatic.     Mouth/Throat:     Mouth: Mucous membranes are moist.     Pharynx: Oropharynx is clear.  Eyes:     Conjunctiva/sclera: Conjunctivae normal.     Pupils: Pupils are equal, round, and reactive to light.  Cardiovascular:     Rate and Rhythm: Normal rate and regular rhythm.     Heart sounds: Normal heart sounds.  Pulmonary:     Effort: Pulmonary effort is normal.     Breath sounds: Normal breath sounds.  Abdominal:     General: Bowel sounds are normal. There is no distension.     Palpations: Abdomen is soft.     Tenderness: There is no abdominal tenderness. There is no guarding or rebound.  Musculoskeletal:     Right lower leg: No edema.     Left lower leg: No edema.     Comments: Chaperone used, left axilla with palpable and visual mild fullness  with no underlying abnormality, right axilla with no abnormalities or masses  Lymphadenopathy:     Cervical: No cervical adenopathy.  Skin:    General: Skin is warm and dry.  Neurological:     Mental Status: She is alert.  Psychiatric:        Mood and Affect: Mood normal.      Assessment/Plan:   Encounter for general adult medical examination with abnormal findings Physical exam completed.  Encouraged healthy diet and remaining active.  Recent mammogram reviewed.  Will obtain lab work to evaluate for possible underlying cause of fullness in  her left axilla.  She will have a flu shot when they become available.  She will follow-up with dermatology as planned.  Lab work as outlined below.  Insomnia Patient reports this is stable.  Trazodone was quite beneficial.  She needs a refill.  Axillary fullness CBC with differential to evaluate for underlying cause.  Determine next step in management once this returns.   Orders Placed This Encounter  Procedures  . TSH  . CBC w/Diff  . Comp Met (CMET)    Meds ordered this encounter  Medications  . traZODone (DESYREL) 50 MG tablet    Sig: TAKE 1 TABLET AT BEDTIME ASNEEDED FOR SLEEP    Dispense:  90 tablet    Refill:  1     Tommi Rumps, MD [place]

## 2019-07-17 LAB — COMPREHENSIVE METABOLIC PANEL
ALT: 13 U/L (ref 0–35)
AST: 16 U/L (ref 0–37)
Albumin: 4.2 g/dL (ref 3.5–5.2)
Alkaline Phosphatase: 45 U/L (ref 39–117)
BUN: 17 mg/dL (ref 6–23)
CO2: 27 mEq/L (ref 19–32)
Calcium: 9.1 mg/dL (ref 8.4–10.5)
Chloride: 105 mEq/L (ref 96–112)
Creatinine, Ser: 0.82 mg/dL (ref 0.40–1.20)
GFR: 66.91 mL/min (ref >60.00)
Glucose, Bld: 95 mg/dL (ref 70–99)
Potassium: 4.7 mEq/L (ref 3.5–5.1)
Sodium: 139 mEq/L (ref 135–145)
Total Bilirubin: 0.4 mg/dL (ref 0.2–1.2)
Total Protein: 6.1 g/dL (ref 6.0–8.3)

## 2019-07-19 DIAGNOSIS — R222 Localized swelling, mass and lump, trunk: Secondary | ICD-10-CM | POA: Insufficient documentation

## 2019-07-19 DIAGNOSIS — R2230 Localized swelling, mass and lump, unspecified upper limb: Secondary | ICD-10-CM | POA: Insufficient documentation

## 2019-07-19 NOTE — Assessment & Plan Note (Signed)
CBC with differential to evaluate for underlying cause.  Determine next step in management once this returns.

## 2019-07-19 NOTE — Assessment & Plan Note (Signed)
Patient reports this is stable.  Trazodone was quite beneficial.  She needs a refill.

## 2019-07-19 NOTE — Assessment & Plan Note (Signed)
Physical exam completed.  Encouraged healthy diet and remaining active.  Recent mammogram reviewed.  Will obtain lab work to evaluate for possible underlying cause of fullness in her left axilla.  She will have a flu shot when they become available.  She will follow-up with dermatology as planned.  Lab work as outlined below.

## 2019-07-27 ENCOUNTER — Telehealth: Payer: Self-pay

## 2019-07-27 DIAGNOSIS — R2230 Localized swelling, mass and lump, unspecified upper limb: Secondary | ICD-10-CM

## 2019-07-27 DIAGNOSIS — R222 Localized swelling, mass and lump, trunk: Secondary | ICD-10-CM

## 2019-07-27 NOTE — Telephone Encounter (Signed)
Copied from [place] [id number]. Topic: Quick Communication - Lab Results (Clinic Use ONLY) >> Jul 27, 2019  2:42 PM Pauline Good wrote: Pt need call back to go over lab results and was told she need to see a specialist per Dr Caryl Bis message  but don't know who she need too see for fullness under her arm.

## 2019-07-30 NOTE — Addendum Note (Signed)
Addended by: Leone Haven on: 07/30/2019 12:37 PM   Modules accepted: Orders

## 2019-07-30 NOTE — Telephone Encounter (Signed)
I have placed a referral to a general surgeon to get their input. If the patient has not hear back about an appointment in the next week she should contact us.

## 2019-07-30 NOTE — Telephone Encounter (Signed)
Patient was told she need to see a specialist per Dr Caryl Bis message  but don't know who she need too see for fullness under her arm. What specialist would she ned to see or what kind of specialist and I will call and speak with her.  Ubaldo Daywalt,cma

## 2019-07-31 ENCOUNTER — Ambulatory Visit: Payer: Medicare HMO | Admitting: General Surgery

## 2019-07-31 ENCOUNTER — Ambulatory Visit: Payer: Self-pay | Admitting: General Surgery

## 2019-07-31 ENCOUNTER — Other Ambulatory Visit: Payer: Self-pay

## 2019-07-31 ENCOUNTER — Encounter: Payer: Self-pay | Admitting: General Surgery

## 2019-07-31 VITALS — BP 147/83 | HR 62 | Temp 97.9°F | Ht 67.0 in | Wt 142.6 lb

## 2019-07-31 DIAGNOSIS — R222 Localized swelling, mass and lump, trunk: Secondary | ICD-10-CM

## 2019-07-31 DIAGNOSIS — R2230 Localized swelling, mass and lump, unspecified upper limb: Secondary | ICD-10-CM

## 2019-07-31 NOTE — Telephone Encounter (Signed)
Called and left a message on voicemail informing the patient that her referral for general surgery was placed and if she did not hear from them in 1 week to give [place] a call back.  Rhyker Silversmith,cma

## 2019-07-31 NOTE — Patient Instructions (Signed)
The patient is aware to call back for any questions or new concerns.  

## 2019-07-31 NOTE — Progress Notes (Signed)
Patient ID: Lisa Petersen, female   DOB: November 21, 1936, 83 y.o.   MRN: [id number]  Chief Complaint  Patient presents with  . New Patient (Initial Visit)    new patient left Axillary fullness swollen lymph nodes x6 months pain under left arm  referred by Dr. Caryl Bis    HPI Lisa Petersen is a 83 y.o. female.   She was referred by her primary care provider, Dr. Tommi Rumps, for evaluation of left axillary fullness.  She states that she has noticed this for about 6 months.  At night, sometimes she has a burning sensation radiating from her axilla down the side of her body.  She says that when she wore a bra with a seam in this area, the pain was exacerbated.  She denies any night sweats, loss of appetite, unanticipated weight loss, fevers, or chills.  The area has never been red, nor has it drained anything.  She states that she has never had shingles and has had both vaccines.  She says the discomfort is worse at night and feels like there is a "wadded up Kleenex" tucked in her armpit.  She did undergo mammogram and ultrasound of the left axilla on June 15, 2019.  These were negative for any nodal or breast pathology.  She has been referred to general surgery for further evaluation and treatment.   Past Medical History:  Diagnosis Date  . Allergy   . Arthritis   . Colon polyps   . Rheumatoid arthritis ([place])   . Thyroid disease   . UTI (lower urinary tract infection)     Past Surgical History:  Procedure Laterality Date  . ABDOMINAL HYSTERECTOMY  1975  . CATARACT EXTRACTION Bilateral 2009  . COLECTOMY Right 2006  . COLONOSCOPY  2015  . REPLACEMENT TOTAL KNEE Right 2011  . TONSILLECTOMY AND ADENOIDECTOMY  1950    Family History  Problem Relation Age of Onset  . Hypertension Mother   . Hypertension Sister   . Mental illness Sister   . Dementia Sister   . Diabetes Brother   . Cancer Brother        Kidney/Bladder  . Dementia Father   . Parkinson's disease Brother   .  Breast cancer Neg Hx     Social History Social History   Tobacco Use  . Smoking status: Never Smoker  . Smokeless tobacco: Never Used  Substance Use Topics  . Alcohol use: No  . Drug use: No    Allergies  Allergen Reactions  . Amoxicillin-Pot Clavulanate Diarrhea  . Etodolac Other (See Comments)    May have raised transaminases May have raised transaminases   . Oxycontin [Oxycodone Hcl] Nausea Only  . Cephalexin Rash  . Metronidazole Nausea Only and Other (See Comments)    dyspepsia dyspepsia   . Sulfa Antibiotics Rash    Current Outpatient Medications  Medication Sig Dispense Refill  . Calcium Carbonate-Vitamin D (CALTRATE 600+D PO) Take by mouth daily.    . fluticasone (FLONASE) 50 MCG/ACT nasal spray Place 2 sprays into both nostrils daily. 16 g 6  . levothyroxine (SYNTHROID, LEVOTHROID) 112 MCG tablet Take 1 tablet by mouth  daily before breakfast 90 tablet 3  . Misc Natural Products (OSTEO BI-FLEX JOINT SHIELD PO) Take 2 tablets by mouth daily.    . Multiple Vitamins-Minerals (CENTRUM SILVER PO) Take by mouth daily.    . traZODone (DESYREL) 50 MG tablet TAKE 1 TABLET AT BEDTIME ASNEEDED FOR SLEEP 90 tablet 1  . vitamin  B-12 (CYANOCOBALAMIN) 1000 MCG tablet Take 1,000 mcg by mouth daily.     No current facility-administered medications for this visit.     Review of Systems Review of Systems  All other systems reviewed and are negative.   Blood pressure (!) 147/83, pulse 62, temperature 97.9 F (36.6 C), temperature source Temporal, height 5\' 7"  (1.702 m), weight 142 lb 9.6 oz (64.7 kg), SpO2 96 %.  Physical Exam Physical Exam Vitals signs reviewed. Exam conducted with a chaperone present.  Constitutional:      General: She is not in acute distress.    Appearance: Normal appearance. She is normal weight.  HENT:     Head: Normocephalic and atraumatic.     Nose: Nose normal.     Mouth/Throat:     Mouth: Mucous membranes are moist.     Pharynx: Oropharynx  is clear.  Eyes:     General: No scleral icterus.       Right eye: No discharge.        Left eye: No discharge.     Extraocular Movements: Extraocular movements intact.  Neck:     Musculoskeletal: Normal range of motion. No neck rigidity.  Cardiovascular:     Rate and Rhythm: Normal rate and regular rhythm.     Pulses: Normal pulses.  Pulmonary:     Effort: Pulmonary effort is normal.     Breath sounds: Normal breath sounds.  Chest:       Comments: The area that the patient describes the abnormality is marked.  There are no masses or other abnormalities appreciated in this area. Abdominal:     General: Abdomen is flat. Bowel sounds are normal.     Palpations: Abdomen is soft.  Genitourinary:    Comments: Deferred Musculoskeletal:        General: Deformity present.     Right lower leg: No edema.     Left lower leg: No edema.     Comments: Deformities of the distal phalanges, suggestive of rheumatoid arthritis.  Lymphadenopathy:     Cervical: No cervical adenopathy.     Upper Body:     Right upper body: No supraclavicular, axillary or pectoral adenopathy.     Left upper body: No supraclavicular, axillary or pectoral adenopathy.  Skin:    General: Skin is warm and dry.  Neurological:     General: No focal deficit present.     Mental Status: She is alert and oriented to person, place, and time.  Psychiatric:        Mood and Affect: Mood normal.        Behavior: Behavior normal.     Data Reviewed I reviewed the diagnostic imaging that was performed on July 10, as well as the prior 3D screening mammogram performed on January 26, 2019.  The screening study showed no mammographic evidence for malignancy and was BI-RADS Category 1 (negative).  The dedicated diagnostic images performed on July 10 showed no suspicious masses, distortion, or microcalcifications.  The examining provider did not appreciate any abnormalities in the lower left axilla or upper outer quadrant of the left  breast.  All findings on ultrasound and mammogram were negative.  Assessment This is an 83 year old woman who has complained of some axillary fullness on the left as well as a burning sensation in her chest wall, predominantly at night.  There are no structural abnormalities appreciated and no worrisome findings on either physical exam or radiographic imaging.  Plan I explained to Lisa Petersen that  I do not have a surgical explanation for her symptoms.  However it was reassuring that both physical exam and diagnostic imaging were negative for any concern for malignancy.  I do not think any operative intervention is warranted or would be of any benefit.  We will see her back on an as-needed basis.    Fredirick Maudlin 07/31/2019, 12:21 PM

## 2019-08-20 DIAGNOSIS — R69 Illness, unspecified: Secondary | ICD-10-CM | POA: Diagnosis not present

## 2019-08-21 DIAGNOSIS — M25562 Pain in left knee: Secondary | ICD-10-CM | POA: Diagnosis not present

## 2019-08-21 DIAGNOSIS — G8929 Other chronic pain: Secondary | ICD-10-CM | POA: Diagnosis not present

## 2019-08-27 ENCOUNTER — Telehealth: Payer: Self-pay | Admitting: Family Medicine

## 2019-08-27 NOTE — Telephone Encounter (Signed)
Pt dropped a pre-operative risk assessment off to be completed by Dr. Caryl Bis. Form is up front in Dr. Ellen Henri color folder.

## 2019-08-27 NOTE — Telephone Encounter (Signed)
Form placed in signed basket.  Aldonia Keeven,cma

## 2019-09-01 NOTE — Telephone Encounter (Signed)
Patient was seen about 1.5 months ago for a physical.  She noted no chest pain or shortness of breath issues at that time.  Please contact her and see if she has developed any new symptoms with chest pain or shortness of breath.  Thanks.

## 2019-09-04 NOTE — Telephone Encounter (Signed)
Form completed. Please fax. Patient is low risk. Gupta perioperative risk 0.03%.

## 2019-09-04 NOTE — Telephone Encounter (Signed)
I called and spoke with patient and she stated she is not having any issues of SOB or chest pain.  Nina,cma

## 2019-09-05 NOTE — Telephone Encounter (Signed)
Lisa Petersen was faxed to [facility] for medical clearance today .  Confirmation printed.  Nina,cma

## 2019-10-08 ENCOUNTER — Other Ambulatory Visit: Payer: Self-pay | Admitting: Orthopedic Surgery

## 2019-10-17 ENCOUNTER — Ambulatory Visit (INDEPENDENT_AMBULATORY_CARE_PROVIDER_SITE_OTHER): Payer: Medicare HMO | Admitting: Family Medicine

## 2019-10-17 ENCOUNTER — Other Ambulatory Visit: Payer: Self-pay

## 2019-10-17 ENCOUNTER — Encounter: Payer: Self-pay | Admitting: Family Medicine

## 2019-10-17 DIAGNOSIS — N390 Urinary tract infection, site not specified: Secondary | ICD-10-CM | POA: Insufficient documentation

## 2019-10-17 DIAGNOSIS — N3000 Acute cystitis without hematuria: Secondary | ICD-10-CM | POA: Diagnosis not present

## 2019-10-17 MED ORDER — NITROFURANTOIN MONOHYD MACRO 100 MG PO CAPS: 100.0000 mg | ORAL_CAPSULE | Freq: Two times a day (BID) | ORAL | 0 refills | Status: DC

## 2019-10-17 NOTE — Assessment & Plan Note (Signed)
Symptoms are concerning for UTI.  We will empirically treat with Macrobid.  Her creatinine clearance is 53 and she should tolerate short-term treatment with Macrobid.  Advised that if she is not improving with this she should let us know.  If she develops any fevers, abdominal pain, or blood in her urine she should contact us immediately.

## 2019-10-17 NOTE — Progress Notes (Signed)
Virtual Visit via telephone Note  This visit type was conducted due to national recommendations for restrictions regarding the COVID-19 pandemic (e.g. social distancing).  This format is felt to be most appropriate for this patient at this time.  All issues noted in this document were discussed and addressed.  No physical exam was performed (except for noted visual exam findings with Video Visits).   I connected with Lisa Petersen today at 10:30 AM EST by telephone and verified that I am speaking with the correct person using two identifiers. Location patient: [place]  Location provider: work Persons participating in the virtual visit: patient, provider  I discussed the limitations, risks, security and privacy concerns of performing an evaluation and management service by telephone and the availability of in person appointments. I also discussed with the patient that there may be a patient responsible charge related to this service. The patient expressed understanding and agreed to proceed.  Interactive audio and video telecommunications were attempted between this provider and patient, however failed, due to patient having technical difficulties OR patient did not have access to video capability.  We continued and completed visit with audio only.   Reason for visit: same day   HPI: UTI:  Dysuria- no  Frequency- yes   Urgency- yes   Hematuria- no   Fever- no  Abd pain- no   Vaginal d/c- no Patient does note some incontinence particularly when she stands up.  She will go to the bathroom to urinate and go a few drops and then have to go back within 10 minutes.  She notes this is consistent with prior UTIs with the exception that she is not having any dysuria.    ROS: See pertinent positives and negatives per HPI.  Past Medical History:  Diagnosis Date  . Allergy   . Arthritis   . Colon polyps   . Rheumatoid arthritis ([place])   . Thyroid disease   . UTI (lower urinary tract  infection)     Past Surgical History:  Procedure Laterality Date  . ABDOMINAL HYSTERECTOMY  1975  . CATARACT EXTRACTION Bilateral 2009  . COLECTOMY Right 2006  . COLONOSCOPY  2015  . REPLACEMENT TOTAL KNEE Right 2011  . TONSILLECTOMY AND ADENOIDECTOMY  1950    Family History  Problem Relation Age of Onset  . Hypertension Mother   . Hypertension Sister   . Mental illness Sister   . Dementia Sister   . Diabetes Brother   . Cancer Brother        Kidney/Bladder  . Dementia Father   . Parkinson's disease Brother   . Breast cancer Neg Hx     SOCIAL HX: Non-smoker.   Current Outpatient Medications:  .  Calcium Carbonate-Vitamin D (CALTRATE 600+D PO), Take by mouth daily., Disp: , Rfl:  .  fluticasone (FLONASE) 50 MCG/ACT nasal spray, Place 2 sprays into both nostrils daily., Disp: 16 g, Rfl: 6 .  levothyroxine (SYNTHROID, LEVOTHROID) 112 MCG tablet, Take 1 tablet by mouth  daily before breakfast, Disp: 90 tablet, Rfl: 3 .  Misc Natural Products (OSTEO BI-FLEX JOINT SHIELD PO), Take 2 tablets by mouth daily., Disp: , Rfl:  .  Multiple Vitamins-Minerals (CENTRUM SILVER PO), Take by mouth daily., Disp: , Rfl:  .  traZODone (DESYREL) 50 MG tablet, TAKE 1 TABLET AT BEDTIME ASNEEDED FOR SLEEP, Disp: 90 tablet, Rfl: 1 .  vitamin B-12 (CYANOCOBALAMIN) 1000 MCG tablet, Take 1,000 mcg by mouth daily., Disp: , Rfl:  .  nitrofurantoin, macrocrystal-monohydrate, (  MACROBID) 100 MG capsule, Take 1 capsule (100 mg total) by mouth 2 (two) times daily., Disp: 10 capsule, Rfl: 0  EXAM: This is a telehealth telephone visit and thus no physical exam was completed.  ASSESSMENT AND PLAN:  Discussed the following assessment and plan:  UTI (urinary tract infection) Symptoms are concerning for UTI.  We will empirically treat with Macrobid.  Her creatinine clearance is 53 and she should tolerate short-term treatment with Macrobid.  Advised that if she is not improving with this she should let us know.   If she develops any fevers, abdominal pain, or blood in her urine she should contact us immediately.    I discussed the assessment and treatment plan with the patient. The patient was provided an opportunity to ask questions and all were answered. The patient agreed with the plan and demonstrated an understanding of the instructions.   The patient was advised to call back or seek an in-person evaluation if the symptoms worsen or if the condition fails to improve as anticipated.  I provided 7 minutes of non-face-to-face time during this encounter.   Tommi Rumps, MD

## 2019-11-19 DIAGNOSIS — R69 Illness, unspecified: Secondary | ICD-10-CM | POA: Diagnosis not present

## 2019-11-27 ENCOUNTER — Other Ambulatory Visit: Payer: Self-pay

## 2019-11-27 ENCOUNTER — Ambulatory Visit (INDEPENDENT_AMBULATORY_CARE_PROVIDER_SITE_OTHER): Payer: Medicare HMO

## 2019-11-27 VITALS — Ht 67.0 in | Wt 138.0 lb

## 2019-11-27 DIAGNOSIS — Z0000 Encounter for general adult medical examination without abnormal findings: Secondary | ICD-10-CM | POA: Diagnosis not present

## 2019-11-27 NOTE — Patient Instructions (Addendum)
  Lisa Petersen , Thank you for taking time to come for your Medicare Wellness Visit. I appreciate your ongoing commitment to your health goals. Please review the following plan we discussed and let me know if I can assist you in the future.   These are the goals we discussed: Goals      Patient Stated   . Follow up with Primary Care Provider (pt-stated)     Maintain Healthy Lifestyle       This is a list of the screening recommended for you and due dates:  Health Maintenance  Topic Date Due  . Tetanus Vaccine  12/05/2016  . Flu Shot  Completed  . DEXA scan (bone density measurement)  Completed  . Pneumonia vaccines  Completed

## 2019-11-27 NOTE — Progress Notes (Signed)
Subjective:   Lisa Petersen is a 83 y.o. female who presents for Medicare Annual (Subsequent) preventive examination.  Review of Systems:  No ROS.  Medicare Wellness Virtual Visit.  Visual/audio telehealth visit, UTA vital signs.   Wt/Ht transferred provided per patient. See social history for additional risk factors.   Cardiac Risk Factors include: advanced age (>3men, >67 women)     Objective:     Vitals: Ht 5\' 7"  (1.702 m)   Wt 138 lb (62.6 kg)   BMI 21.61 kg/m   Body mass index is 21.61 kg/m.  Advanced Directives 11/27/2019 08/31/2018  Does Patient Have a Medical Advance Directive? Yes Yes  Type of Advance Directive Living will;Healthcare Power of [place];Living will  Does patient want to make changes to medical advance directive? No - Patient declined No - Patient declined  Copy of [place] in Chart? Yes - validated most recent copy scanned in chart (See row information) Yes    Tobacco Social History   Tobacco Use  Smoking Status Never Smoker  Smokeless Tobacco Never Used     Counseling given: Not Answered   Clinical Intake:  Pre-visit preparation completed: Yes           How often do you need to have someone help you when you read instructions, pamphlets, or other written materials from your doctor or pharmacy?: 1 - Never  Interpreter Needed?: No     Past Medical History:  Diagnosis Date  . Allergy   . Arthritis   . Colon polyps   . Rheumatoid arthritis ([place])   . Thyroid disease   . UTI (lower urinary tract infection)    Past Surgical History:  Procedure Laterality Date  . ABDOMINAL HYSTERECTOMY  1975  . CATARACT EXTRACTION Bilateral 2009  . COLECTOMY Right 2006  . COLONOSCOPY  2015  . REPLACEMENT TOTAL KNEE Right 2011  . TONSILLECTOMY AND ADENOIDECTOMY  1950   Family History  Problem Relation Age of Onset  . Hypertension Mother   . Hypertension Sister   . Mental illness Sister    . Dementia Sister   . Diabetes Brother   . Cancer Brother        Kidney/Bladder  . Dementia Father   . Parkinson's disease Brother   . Breast cancer Neg Hx    Social History   Socioeconomic History  . Marital status: Widowed    Spouse name: Not on file  . Number of children: Not on file  . Years of education: Not on file  . Highest education level: Not on file  Occupational History  . Not on file  Tobacco Use  . Smoking status: Never Smoker  . Smokeless tobacco: Never Used  Substance and Sexual Activity  . Alcohol use: No  . Drug use: No  . Sexual activity: Yes    Birth control/protection: None  Other Topics Concern  . Not on file  Social History Narrative   Has a living will scanned into chart.   Social Determinants of Health   Financial Resource Strain:   . Difficulty of Paying Living Expenses: Not on file  Food Insecurity:   . Worried About Charity fundraiser in the Last Year: Not on file  . Ran Out of Food in the Last Year: Not on file  Transportation Needs:   . Lack of Transportation (Medical): Not on file  . Lack of Transportation (Non-Medical): Not on file  Physical Activity:   .  Days of Exercise per Week: Not on file  . Minutes of Exercise per Session: Not on file  Stress:   . Feeling of Stress : Not on file  Social Connections:   . Frequency of Communication with Friends and Family: Not on file  . Frequency of Social Gatherings with Friends and Family: Not on file  . Attends Religious Services: Not on file  . Active Member of Clubs or Organizations: Not on file  . Attends Archivist Meetings: Not on file  . Marital Status: Not on file    Outpatient Encounter Medications as of 11/27/2019  Medication Sig  . Calcium Carbonate-Vitamin D (CALTRATE 600+D PO) Take 2 tablets by mouth daily.   . Cyanocobalamin (VITAMIN B-12 PO) Take 2,500 mcg by mouth daily.   . fluticasone (FLONASE) 50 MCG/ACT nasal spray Place 2 sprays into both nostrils  daily.  Marland Kitchen levothyroxine (SYNTHROID, LEVOTHROID) 112 MCG tablet Take 1 tablet by mouth  daily before breakfast (Patient taking differently: Take 112 mcg by mouth daily before breakfast. )  . Misc Natural Products (OSTEO BI-FLEX JOINT SHIELD PO) Take 1 tablet by mouth 2 (two) times daily.   . Multiple Vitamins-Minerals (CENTRUM SILVER PO) Take 1 tablet by mouth daily.   . Multiple Vitamins-Minerals (PRESERVISION AREDS 2+MULTI VIT PO) Take 1 capsule by mouth 2 (two) times daily.  . nitrofurantoin, macrocrystal-monohydrate, (MACROBID) 100 MG capsule Take 1 capsule (100 mg total) by mouth 2 (two) times daily.  . traZODone (DESYREL) 50 MG tablet TAKE 1 TABLET AT BEDTIME ASNEEDED FOR SLEEP (Patient taking differently: Take 50 mg by mouth at bedtime. )   No facility-administered encounter medications on file as of 11/27/2019.    Activities of Daily Living In your present state of health, do you have any difficulty performing the following activities: 11/27/2019  Hearing? N  Vision? N  Difficulty concentrating or making decisions? N  Walking or climbing stairs? Y  Comment Chronic knee pain  Dressing or bathing? N  Doing errands, shopping? N  Preparing Food and eating ? N  Using the Toilet? N  In the past six months, have you accidently leaked urine? N  Do you have problems with loss of bowel control? N  Managing your Medications? N  Managing your Finances? N  Housekeeping or managing your Housekeeping? N  Some recent data might be hidden    Patient Care Team: Lisa Haven, MD as PCP - General (Family Medicine) Lisa Green, MD as Referring Physician (Surgery) Petersen, Lisa Char, MD as Attending Physician (Dermatology) Lisa Robson, MD as Referring Physician (Ophthalmology)    Assessment:   This is a routine wellness examination for Lisa Petersen.  Nurse connected with patient 11/27/19 at  8:30 AM EST by a telephone enabled telemedicine application and verified that I am  speaking with the correct person using two identifiers. Patient stated full name and DOB. Patient gave permission to continue with virtual visit. Patient's location was at home and Nurse's location was at [place] office.   Patient is alert and oriented x3. Patient denies difficulty focusing or concentrating. Patient likes to read and play word games for brain stimulation.   Health Maintenance Due: -Tdap vaccine- discussed; to be completed with doctor in visit or local pharmacy.   See completed HM at the end of note.   Eye: Visual acuity not assessed. Virtual visit. Followed by their ophthalmologist. Retinopathy- none reported  Dental: Visits every 6 months.    Hearing: Demonstrates normal hearing during visit.  Safety:  Patient feels safe at home- yes Patient does have smoke detectors at home- yes Patient does wear sunscreen or protective clothing when in direct sunlight - yes Patient does wear seat belt when in a moving vehicle - yes Patient drives- yes Adequate lighting in walkways free from debris- yes Grab bars and handrails used as appropriate- yes Ambulates with an assistive device- no Cell phone on person when ambulating outside of the home- yes  Social: Alcohol intake - no    Smoking history- never   Smokers in home? none Illicit drug use? none  Medication: Taking as directed and without issues.  Pill box in use -yes  Self managed - yes   Covid-19: Precautions and sickness symptoms discussed. Wears mask, social distancing, hand hygiene as appropriate.   Activities of Daily Living Patient denies needing assistance with: household chores, feeding themselves, getting from bed to chair, getting to the toilet, bathing/showering, dressing, managing money, or preparing meals.   Discussed the importance of a healthy diet, water intake and the benefits of aerobic exercise.   Physical activity- active around the home  Diet:  Regular Water: good intake  Other  Providers Patient Care Team: Lisa Haven, MD as PCP - General (Family Medicine) Lisa Green, MD as Referring Physician (Surgery) Petersen, Lisa Char, MD as Attending Physician (Dermatology) Lisa Robson, MD as Referring Physician (Ophthalmology)  Exercise Activities and Dietary recommendations Current Exercise Habits: Home exercise routine  Goals      Patient Stated   . Follow up with Primary Care Provider (pt-stated)     Maintain Healthy Lifestyle       Fall Risk Fall Risk  11/27/2019 10/17/2019 07/31/2019 07/16/2019 06/07/2019  Falls in the past year? 0 0 0 0 0  Number falls in past yr: - 0 - 0 -  Follow up Falls prevention discussed Falls evaluation completed Falls evaluation completed - -   Timed Get Up and Go performed: no, virtual visit  Depression Screen PHQ 2/9 Scores 11/27/2019 10/17/2019 07/16/2019 06/07/2019  PHQ - 2 Score 0 0 0 0  PHQ- 9 Score - - - -     Cognitive Function MMSE - Mini Mental State Exam 08/31/2018  Orientation to time 5  Orientation to Place 5  Registration 3  Attention/ Calculation 5  Recall 3  Language- name 2 objects 2  Language- repeat 1  Language- follow 3 step command 3  Language- read & follow direction 1  Write a sentence 1  Copy design 1  Total score 30     6CIT Screen 11/27/2019  What Year? 0 points  What month? 0 points  What time? 0 points  Count back from 20 0 points  Months in reverse 0 points  Repeat phrase 0 points  Total Score 0    Immunization History  Administered Date(s) Administered  . Influenza, High Dose Seasonal PF 08/31/2018, 08/20/2019  . Influenza-Unspecified 08/06/2014, 08/25/2015, 09/02/2016  . Pneumococcal Conjugate-13 06/17/2015  . Pneumococcal Polysaccharide-23 05/29/2009  . Td 12/05/2006  . Zoster 12/21/2007  . Zoster Recombinat (Shingrix) 09/28/2018, 12/02/2018   Screening Tests Health Maintenance  Topic Date Due  . TETANUS/TDAP  12/05/2016  . INFLUENZA VACCINE  Completed    . DEXA SCAN  Completed  . PNA vac Low Risk Adult  Completed      Plan:   Keep all routine maintenance appointments.   Follow up 01/18/20 @ 9:00. Lipid panel and T4, free added to appointment note per patient request.  Medicare Attestation I have personally reviewed: The patient's medical and social history Their use of alcohol, tobacco or illicit drugs Their current medications and supplements The patient's functional ability including ADLs,fall risks, home safety risks, cognitive, and hearing and visual impairment Diet and physical activities Evidence for depression   I have reviewed and discussed with patient certain preventive protocols, quality metrics, and best practice recommendations.   Varney Biles, LPN  [id number]

## 2019-12-03 ENCOUNTER — Encounter (HOSPITAL_COMMUNITY): Payer: Self-pay

## 2019-12-03 NOTE — Patient Instructions (Signed)
DUE TO COVID-19 ONLY ONE VISITOR IS ALLOWED TO COME WITH YOU AND STAY IN THE WAITING ROOM ONLY DURING PRE OP AND PROCEDURE. THE ONE VISITOR MAY VISIT WITH YOU IN YOUR PRIVATE ROOM DURING VISITING HOURS ONLY!!   COVID SWAB TESTING MUST BE COMPLETED ON:   Thursday, Dec. 31, 2020 at [place], [place] [facility] enter pre surgical testing line (Must self quarantine after testing. Follow instructions on handout.)             Your procedure is scheduled on: Monday, Jan. 4, 2021   Report to [facility] Main  Entrance    Report to admitting at 6:55 AM   Call this number if you have problems the morning of surgery [phone]   Do not eat food:After Midnight.   May have liquids until 6:25 AM day of surgery   CLEAR LIQUID DIET  Foods Allowed                                                                     Foods Excluded  Water, Black Coffee and tea, regular and decaf                             liquids that you cannot  Plain Jell-O in any flavor  (No red)                                           see through such as: Fruit ices (not with fruit pulp)                                     milk, soups, orange juice  Iced Popsicles (No red)                                    All solid food Carbonated beverages, regular and diet                                    Apple juices Sports drinks like Gatorade (No red) Lightly seasoned clear broth or consume(fat free) Sugar, honey syrup  Sample Menu Breakfast                                Lunch                                     Supper Cranberry juice                    Beef broth                            Chicken broth Jell-O  Grape juice                           Apple juice Coffee or tea                        Jell-O                                      Popsicle                                                Coffee or tea                        Coffee or  tea   Complete one Ensure drink the morning of surgery at 6:25 AM the day of surgery.   Brush your teeth the morning of surgery.   Do NOT smoke after Midnight   Take these medicines the morning of surgery with A SIP OF WATER: Levothyroxine   May use Flonase morning of surgery                               You may not have any metal on your body including hair pins, jewelry, and body piercings             Do not wear make-up, lotions, powders, perfumes/cologne, or deodorant             Do not wear nail polish.  Do not shave  48 hours prior to surgery.                Do not bring valuables to the hospital. [name].   Contacts, dentures or bridgework may not be worn into surgery.   Bring small overnight bag day of surgery.    Patients discharged the day of surgery will not be allowed to drive home.   Special Instructions: Bring a copy of your healthcare power of attorney and living will documents         the day of surgery if you haven't scanned them in before.              Please read over the following fact sheets you were given:  [facility] - Preparing for Surgery Before surgery, you can play an important role.  Because skin is not sterile, your skin needs to be as free of germs as possible.  You can reduce the number of germs on your skin by washing with CHG (chlorahexidine gluconate) soap before surgery.  CHG is an antiseptic cleaner which kills germs and bonds with the skin to continue killing germs even after washing. Please DO NOT use if you have an allergy to CHG or antibacterial soaps.  If your skin becomes reddened/irritated stop using the CHG and inform your nurse when you arrive at Short Stay. Do not shave (including legs and underarms) for at least 48 hours prior to the first CHG shower.  You may shave your face/neck.  Please follow these instructions carefully:  1.  Shower with CHG Soap the night before surgery and the   morning of surgery.  2.  If you choose to wash your hair, wash your hair first as usual with your normal  shampoo.  3.  After you shampoo, rinse your hair and body thoroughly to remove the shampoo.                             4.  Use CHG as you would any other liquid soap.  You can apply chg directly to the skin and wash.  Gently with a scrungie or clean washcloth.  5.  Apply the CHG Soap to your body ONLY FROM THE NECK DOWN.   Do   not use on face/ open                           Wound or open sores. Avoid contact with eyes, ears mouth and   genitals (private parts).                       Wash face,  Genitals (private parts) with your normal soap.             6.  Wash thoroughly, paying special attention to the area where your    surgery  will be performed.  7.  Thoroughly rinse your body with warm water from the neck down.  8.  DO NOT shower/wash with your normal soap after using and rinsing off the CHG Soap.                9.  Pat yourself dry with a clean towel.            10.  Wear clean pajamas.            11.  Place clean sheets on your bed the night of your first shower and do not  sleep with pets. Day of Surgery : Do not apply any lotions/deodorants the morning of surgery.  Please wear clean clothes to the hospital/surgery center.  FAILURE TO FOLLOW THESE INSTRUCTIONS MAY RESULT IN THE CANCELLATION OF YOUR SURGERY  PATIENT SIGNATURE_________________________________  NURSE SIGNATURE__________________________________  ________________________________________________________________________   Lisa Petersen  An incentive spirometer is a tool that can help keep your lungs clear and active. This tool measures how well you are filling your lungs with each breath. Taking long deep breaths may help reverse or decrease the chance of developing breathing (pulmonary) problems (especially infection) following:  A long period of time when you are unable to move or be active. BEFORE  THE PROCEDURE   If the spirometer includes an indicator to show your best effort, your nurse or respiratory therapist will set it to a desired goal.  If possible, sit up straight or lean slightly forward. Try not to slouch.  Hold the incentive spirometer in an upright position. INSTRUCTIONS FOR USE  1. Sit on the edge of your bed if possible, or sit up as far as you can in bed or on a chair. 2. Hold the incentive spirometer in an upright position. 3. Breathe out normally. 4. Place the mouthpiece in your mouth and seal your lips tightly around it. 5. Breathe in slowly and as deeply as possible, raising the piston or the ball toward the top of the column. 6. Hold your breath for 3-5 seconds or for as long as possible. Allow the piston  or ball to fall to the bottom of the column. 7. Remove the mouthpiece from your mouth and breathe out normally. 8. Rest for a few seconds and repeat Steps 1 through 7 at least 10 times every 1-2 hours when you are awake. Take your time and take a few normal breaths between deep breaths. 9. The spirometer may include an indicator to show your best effort. Use the indicator as a goal to work toward during each repetition. 10. After each set of 10 deep breaths, practice coughing to be sure your lungs are clear. If you have an incision (the cut made at the time of surgery), support your incision when coughing by placing a pillow or rolled up towels firmly against it. Once you are able to get out of bed, walk around indoors and cough well. You may stop using the incentive spirometer when instructed by your caregiver.  RISKS AND COMPLICATIONS  Take your time so you do not get dizzy or light-headed.  If you are in pain, you may need to take or ask for pain medication before doing incentive spirometry. It is harder to take a deep breath if you are having pain. AFTER USE  Rest and breathe slowly and easily.  It can be helpful to keep track of a log of your progress.  Your caregiver can provide you with a simple table to help with this. If you are using the spirometer at home, follow these instructions: [place] IF:   You are having difficultly using the spirometer.  You have trouble using the spirometer as often as instructed.  Your pain medication is not giving enough relief while using the spirometer.  You develop fever of 100.5 F (38.1 C) or higher. SEEK IMMEDIATE MEDICAL CARE IF:   You cough up bloody sputum that had not been present before.  You develop fever of 102 F (38.9 C) or greater.  You develop worsening pain at or near the incision site. MAKE SURE YOU:   Understand these instructions.  Will watch your condition.  Will get help right away if you are not doing well or get worse. Document Released: 04/04/2007 Document Revised: 02/14/2012 Document Reviewed: 06/05/2007 [employer] Patient Information 2014 [place], [place].   ________________________________________________________________________

## 2019-12-04 ENCOUNTER — Other Ambulatory Visit: Payer: Self-pay

## 2019-12-04 ENCOUNTER — Encounter (HOSPITAL_COMMUNITY): Payer: Self-pay

## 2019-12-04 ENCOUNTER — Encounter (HOSPITAL_COMMUNITY)
Admission: RE | Admit: 2019-12-04 | Discharge: 2019-12-04 | Disposition: A | Discharge status: Home / Self Care | Payer: Medicare HMO | Source: Ambulatory Visit | Attending: Orthopedic Surgery | Admitting: Orthopedic Surgery

## 2019-12-04 DIAGNOSIS — Z01818 Encounter for other preprocedural examination: Secondary | ICD-10-CM | POA: Diagnosis not present

## 2019-12-04 HISTORY — DX: Personal history of other specified conditions: Z87.898

## 2019-12-04 HISTORY — DX: Personal history of other endocrine, nutritional and metabolic disease: Z86.39

## 2019-12-04 HISTORY — DX: Deficiency of other specified B group vitamins: E53.8

## 2019-12-04 HISTORY — DX: Personal history of other diseases of the digestive system: Z87.19

## 2019-12-04 HISTORY — DX: Insomnia, unspecified: G47.00

## 2019-12-04 HISTORY — DX: Anxiety disorder, unspecified: F41.9

## 2019-12-04 HISTORY — DX: Depression, unspecified: F32.A

## 2019-12-04 HISTORY — DX: Hyperlipidemia, unspecified: E78.5

## 2019-12-04 HISTORY — DX: Other specified disorders of bone density and structure, unspecified site: M85.80

## 2019-12-04 HISTORY — DX: Unspecified osteoarthritis, unspecified site: M19.90

## 2019-12-04 HISTORY — DX: Personal history of other diseases of the female genital tract: Z87.42

## 2019-12-04 HISTORY — DX: Hypothyroidism, unspecified: E03.9

## 2019-12-04 HISTORY — DX: Plantar fascial fibromatosis: M72.2

## 2019-12-04 NOTE — Progress Notes (Signed)
PCP - E. Sonnenberg last office visit 10/2019 in epic Cardiologist - N/A  Chest x-ray - greater than 1 year  EKG - greater than 1 year Stress Test - greater than 2 years ECHO -  greater than 2 years Cardiac Cath - N/A  Sleep Study - N/A CPAP - N/A  Fasting Blood Sugar - N/A Checks Blood Sugar __N/A___ times a day  Blood Thinner Instructions:  N/A Aspirin Instructions:  N/A Last Dose:  N/A  Anesthesia review:N/A   Patient denies shortness of breath, fever, cough and chest pain at PAT appointment   Patient verbalized understanding of instructions that were given to them at the PAT appointment. Patient was also instructed that they will need to review over the PAT instructions again at home before surgery.

## 2019-12-06 ENCOUNTER — Other Ambulatory Visit: Payer: Self-pay

## 2019-12-06 ENCOUNTER — Other Ambulatory Visit (HOSPITAL_COMMUNITY)
Admission: RE | Admit: 2019-12-06 | Discharge: 2019-12-06 | Disposition: A | Discharge status: Home / Self Care | Payer: Medicare HMO | Source: Ambulatory Visit | Attending: Orthopedic Surgery | Admitting: Orthopedic Surgery

## 2019-12-06 ENCOUNTER — Encounter (HOSPITAL_COMMUNITY)
Admission: RE | Admit: 2019-12-06 | Discharge: 2019-12-06 | Disposition: A | Discharge status: Home / Self Care | Payer: Medicare HMO | Source: Ambulatory Visit | Attending: Orthopedic Surgery | Admitting: Orthopedic Surgery

## 2019-12-06 DIAGNOSIS — Z01812 Encounter for preprocedural laboratory examination: Secondary | ICD-10-CM | POA: Diagnosis present

## 2019-12-06 DIAGNOSIS — Z01818 Encounter for other preprocedural examination: Secondary | ICD-10-CM | POA: Diagnosis not present

## 2019-12-06 DIAGNOSIS — Z20828 Contact with and (suspected) exposure to other viral communicable diseases: Secondary | ICD-10-CM | POA: Insufficient documentation

## 2019-12-06 LAB — CBC WITH DIFFERENTIAL/PLATELET
Abs Immature Granulocytes: 0.01 10*3/uL (ref 0.00–0.07)
Basophils Absolute: 0 10*3/uL (ref 0.0–0.1)
Basophils Relative: 1 %
Eosinophils Absolute: 0.1 10*3/uL (ref 0.0–0.5)
Eosinophils Relative: 2 %
HCT: 40.3 % (ref 36.0–46.0)
Hemoglobin: 13.2 g/dL (ref 12.0–15.0)
Immature Granulocytes: 0 %
Lymphocytes Relative: 30 %
Lymphs Abs: 1.3 10*3/uL (ref 0.7–4.0)
MCH: 30.6 pg (ref 26.0–34.0)
MCHC: 32.8 g/dL (ref 30.0–36.0)
MCV: 93.3 fL (ref 80.0–100.0)
Monocytes Absolute: 0.3 10*3/uL (ref 0.1–1.0)
Monocytes Relative: 8 %
Neutro Abs: 2.5 10*3/uL (ref 1.7–7.7)
Neutrophils Relative %: 59 %
Platelets: 199 10*3/uL (ref 150–400)
RBC: 4.32 MIL/uL (ref 3.87–5.11)
RDW: 12.3 % (ref 11.5–15.5)
WBC: 4.1 10*3/uL (ref 4.0–10.5)
nRBC: 0 % (ref 0.0–0.2)

## 2019-12-06 LAB — COMPREHENSIVE METABOLIC PANEL
ALT: 19 U/L (ref 0–44)
AST: 19 U/L (ref 15–41)
Albumin: 4 g/dL (ref 3.5–5.0)
Alkaline Phosphatase: 41 U/L (ref 38–126)
Anion gap: 8 (ref 5–15)
BUN: 16 mg/dL (ref 8–23)
CO2: 28 mmol/L (ref 22–32)
Calcium: 9.3 mg/dL (ref 8.9–10.3)
Chloride: 105 mmol/L (ref 98–111)
Creatinine, Ser: 0.78 mg/dL (ref 0.44–1.00)
GFR calc Af Amer: >60 mL/min (ref >60)
GFR calc non Af Amer: >60 mL/min (ref >60)
Glucose, Bld: 103 mg/dL — ABNORMAL HIGH (ref 70–99)
Potassium: 4.4 mmol/L (ref 3.5–5.1)
Sodium: 141 mmol/L (ref 135–145)
Total Bilirubin: 0.7 mg/dL (ref 0.3–1.2)
Total Protein: 6.4 g/dL — ABNORMAL LOW (ref 6.5–8.1)

## 2019-12-06 LAB — SURGICAL PCR SCREEN
MRSA, PCR: NEGATIVE
Staphylococcus aureus: NEGATIVE

## 2019-12-07 LAB — NOVEL CORONAVIRUS, NAA (HOSP ORDER, SEND-OUT TO REF LAB; TAT 18-24 HRS): SARS-CoV-2, NAA: NOT DETECTED

## 2019-12-09 MED ORDER — BUPIVACAINE LIPOSOME 1.3 % IJ SUSP
20.0000 mL | INTRAMUSCULAR | Status: DC
  Filled 2019-12-09: qty 20

## 2019-12-09 NOTE — Anesthesia Preprocedure Evaluation (Addendum)
Anesthesia Evaluation  Patient identified by MRN, date of birth, ID band Patient awake    Reviewed: Allergy & Precautions, NPO status , Patient's Chart, lab work & pertinent test results  History of Anesthesia Complications Negative for: history of anesthetic complications  Airway Mallampati: II  TM Distance: >3 FB Neck ROM: Full    Dental no notable dental hx. (+) Dental Advisory Given   Pulmonary neg pulmonary ROS,    Pulmonary exam normal        Cardiovascular negative cardio ROS Normal cardiovascular exam     Neuro/Psych PSYCHIATRIC DISORDERS Anxiety Depression negative neurological ROS     GI/Hepatic negative GI ROS, Neg liver ROS,   Endo/Other  Hypothyroidism   Renal/GU negative Renal ROS     Musculoskeletal  (+) Arthritis , Osteoarthritis and Rheumatoid disorders,    Abdominal   Peds  Hematology negative hematology ROS (+)   Anesthesia Other Findings Day of surgery medications reviewed with the patient.  Reproductive/Obstetrics                            Anesthesia Physical Anesthesia Plan  ASA: II  Anesthesia Plan: MAC and Spinal   Post-op Pain Management:  Regional for Post-op pain   Induction:   PONV Risk Score and Plan: 2 and Ondansetron and Propofol infusion  Airway Management Planned: Natural Airway  Additional Equipment:   Intra-op Plan:   Post-operative Plan:   Informed Consent: I have reviewed the patients History and Physical, chart, labs and discussed the procedure including the risks, benefits and alternatives for the proposed anesthesia with the patient or authorized representative who has indicated his/her understanding and acceptance.     Dental advisory given  Plan Discussed with: Anesthesiologist  Anesthesia Plan Comments:        Anesthesia Quick Evaluation

## 2019-12-10 ENCOUNTER — Ambulatory Visit (HOSPITAL_COMMUNITY): Payer: Medicare HMO | Admitting: Physician Assistant

## 2019-12-10 ENCOUNTER — Encounter (HOSPITAL_COMMUNITY)
Admission: RE | Disposition: A | Discharge status: Home / Self Care | Payer: Self-pay | Source: Other Acute Inpatient Hospital | Attending: Orthopedic Surgery

## 2019-12-10 ENCOUNTER — Encounter (HOSPITAL_COMMUNITY): Payer: Self-pay | Admitting: Orthopedic Surgery

## 2019-12-10 ENCOUNTER — Ambulatory Visit (HOSPITAL_COMMUNITY)
Admission: RE | Admit: 2019-12-10 | Discharge: 2019-12-10 | Disposition: A | Discharge status: Home / Self Care | Payer: Medicare HMO | Source: Other Acute Inpatient Hospital | Attending: Orthopedic Surgery | Admitting: Orthopedic Surgery

## 2019-12-10 DIAGNOSIS — Z79890 Hormone replacement therapy (postmenopausal): Secondary | ICD-10-CM | POA: Insufficient documentation

## 2019-12-10 DIAGNOSIS — M25762 Osteophyte, left knee: Secondary | ICD-10-CM | POA: Diagnosis not present

## 2019-12-10 DIAGNOSIS — M1712 Unilateral primary osteoarthritis, left knee: Secondary | ICD-10-CM | POA: Diagnosis not present

## 2019-12-10 DIAGNOSIS — G4700 Insomnia, unspecified: Secondary | ICD-10-CM | POA: Diagnosis not present

## 2019-12-10 DIAGNOSIS — E538 Deficiency of other specified B group vitamins: Secondary | ICD-10-CM | POA: Insufficient documentation

## 2019-12-10 DIAGNOSIS — M25562 Pain in left knee: Secondary | ICD-10-CM | POA: Diagnosis present

## 2019-12-10 DIAGNOSIS — R69 Illness, unspecified: Secondary | ICD-10-CM | POA: Diagnosis not present

## 2019-12-10 DIAGNOSIS — Z85828 Personal history of other malignant neoplasm of skin: Secondary | ICD-10-CM | POA: Diagnosis not present

## 2019-12-10 DIAGNOSIS — E039 Hypothyroidism, unspecified: Secondary | ICD-10-CM | POA: Diagnosis not present

## 2019-12-10 DIAGNOSIS — Z96652 Presence of left artificial knee joint: Secondary | ICD-10-CM | POA: Diagnosis not present

## 2019-12-10 HISTORY — PX: TOTAL KNEE ARTHROPLASTY: SHX125

## 2019-12-10 SURGERY — ARTHROPLASTY, KNEE, TOTAL
Anesthesia: Monitor Anesthesia Care | Site: Knee | Laterality: Left

## 2019-12-10 MED ORDER — LIDOCAINE 2% (20 MG/ML) 5 ML SYRINGE
INTRAMUSCULAR | Status: AC
  Filled 2019-12-10: qty 5

## 2019-12-10 MED ORDER — SODIUM CHLORIDE (PF) 0.9 % IJ SOLN
INTRAMUSCULAR | Status: AC
  Filled 2019-12-10: qty 20

## 2019-12-10 MED ORDER — BUPIVACAINE LIPOSOME 1.3 % IJ SUSP
INTRAMUSCULAR | Status: DC | PRN
  Administered 2019-12-10: 20 mL

## 2019-12-10 MED ORDER — MIDAZOLAM HCL 2 MG/2ML IJ SOLN
1.0000 mg | Freq: Once | INTRAMUSCULAR | Status: AC
  Administered 2019-12-10: 1 mg via INTRAVENOUS
  Filled 2019-12-10: qty 2

## 2019-12-10 MED ORDER — METHOCARBAMOL 500 MG PO TABS: 500.0000 mg | ORAL_TABLET | Freq: Four times a day (QID) | ORAL | Status: DC | PRN

## 2019-12-10 MED ORDER — MIDAZOLAM HCL 2 MG/2ML IJ SOLN
INTRAMUSCULAR | Status: AC
  Filled 2019-12-10: qty 2

## 2019-12-10 MED ORDER — FENTANYL CITRATE (PF) 100 MCG/2ML IJ SOLN
INTRAMUSCULAR | Status: DC | PRN
  Administered 2019-12-10: 50 ug via INTRAVENOUS
  Administered 2019-12-10: 25 ug via INTRAVENOUS

## 2019-12-10 MED ORDER — CLINDAMYCIN PHOSPHATE 900 MG/50ML IV SOLN
900.0000 mg | INTRAVENOUS | Status: AC
  Administered 2019-12-10: 900 mg via INTRAVENOUS
  Filled 2019-12-10: qty 50

## 2019-12-10 MED ORDER — PROPOFOL 10 MG/ML IV BOLUS
INTRAVENOUS | Status: AC
  Filled 2019-12-10: qty 40

## 2019-12-10 MED ORDER — SODIUM CHLORIDE 0.9 % IR SOLN
Status: DC | PRN
  Administered 2019-12-10: 1000 mL

## 2019-12-10 MED ORDER — EPHEDRINE 5 MG/ML INJ
INTRAVENOUS | Status: AC
  Filled 2019-12-10: qty 10

## 2019-12-10 MED ORDER — PROPOFOL 500 MG/50ML IV EMUL
INTRAVENOUS | Status: DC | PRN
  Administered 2019-12-10: 25 ug/kg/min via INTRAVENOUS

## 2019-12-10 MED ORDER — FENTANYL CITRATE (PF) 100 MCG/2ML IJ SOLN
INTRAMUSCULAR | Status: AC
  Filled 2019-12-10: qty 2

## 2019-12-10 MED ORDER — CELECOXIB 200 MG PO CAPS
200.0000 mg | ORAL_CAPSULE | Freq: Once | ORAL | Status: AC
  Administered 2019-12-10: 07:00:00 200 mg via ORAL
  Filled 2019-12-10: qty 1

## 2019-12-10 MED ORDER — GLYCOPYRROLATE PF 0.2 MG/ML IJ SOSY
PREFILLED_SYRINGE | INTRAMUSCULAR | Status: AC
  Filled 2019-12-10: qty 1

## 2019-12-10 MED ORDER — MEPIVACAINE HCL (PF) 2 % IJ SOLN
INTRAMUSCULAR | Status: AC
  Filled 2019-12-10: qty 20

## 2019-12-10 MED ORDER — DEXAMETHASONE SODIUM PHOSPHATE 10 MG/ML IJ SOLN
INTRAMUSCULAR | Status: AC
  Filled 2019-12-10: qty 1

## 2019-12-10 MED ORDER — METHOCARBAMOL 500 MG PO TABS: 500.0000 mg | ORAL_TABLET | Freq: Four times a day (QID) | ORAL | 0 refills | Status: DC

## 2019-12-10 MED ORDER — LACTATED RINGERS IV SOLN: INTRAVENOUS | Status: DC

## 2019-12-10 MED ORDER — CHLORHEXIDINE GLUCONATE 4 % EX LIQD: 60.0000 mL | Freq: Once | CUTANEOUS | Status: DC

## 2019-12-10 MED ORDER — FENTANYL CITRATE (PF) 100 MCG/2ML IJ SOLN: 25.0000 ug | INTRAMUSCULAR | Status: DC | PRN

## 2019-12-10 MED ORDER — CLINDAMYCIN PHOSPHATE 600 MG/50ML IV SOLN
600.0000 mg | Freq: Four times a day (QID) | INTRAVENOUS | Status: DC
  Filled 2019-12-10: qty 50

## 2019-12-10 MED ORDER — BUPIVACAINE HCL (PF) 0.25 % IJ SOLN
INTRAMUSCULAR | Status: DC | PRN
  Administered 2019-12-10: 30 mL

## 2019-12-10 MED ORDER — WATER FOR IRRIGATION, STERILE IR SOLN
Status: DC | PRN
  Administered 2019-12-10: 2000 mL

## 2019-12-10 MED ORDER — HYDROCODONE-ACETAMINOPHEN 5-325 MG PO TABS: 1.0000 | ORAL_TABLET | Freq: Four times a day (QID) | ORAL | 0 refills | Status: DC | PRN

## 2019-12-10 MED ORDER — MEPIVACAINE HCL (PF) 2 % IJ SOLN
INTRAMUSCULAR | Status: DC | PRN
  Administered 2019-12-10: 60 mg via EPIDURAL

## 2019-12-10 MED ORDER — LIDOCAINE HCL (CARDIAC) PF 100 MG/5ML IV SOSY
PREFILLED_SYRINGE | INTRAVENOUS | Status: DC | PRN
  Administered 2019-12-10: 30 mg via INTRAVENOUS

## 2019-12-10 MED ORDER — BUPIVACAINE HCL (PF) 0.25 % IJ SOLN
INTRAMUSCULAR | Status: AC
  Filled 2019-12-10: qty 30

## 2019-12-10 MED ORDER — ACETAMINOPHEN 500 MG PO TABS
1000.0000 mg | ORAL_TABLET | Freq: Once | ORAL | Status: AC
  Administered 2019-12-10: 1000 mg via ORAL
  Filled 2019-12-10: qty 2

## 2019-12-10 MED ORDER — FENTANYL CITRATE (PF) 100 MCG/2ML IJ SOLN
50.0000 ug | Freq: Once | INTRAMUSCULAR | Status: AC
  Administered 2019-12-10: 08:00:00 50 ug via INTRAVENOUS
  Filled 2019-12-10: qty 2

## 2019-12-10 MED ORDER — SODIUM CHLORIDE 0.9% FLUSH
INTRAVENOUS | Status: DC | PRN
  Administered 2019-12-10: 20 mL

## 2019-12-10 MED ORDER — TRANEXAMIC ACID-NACL 1000-0.7 MG/100ML-% IV SOLN
1000.0000 mg | INTRAVENOUS | Status: AC
  Administered 2019-12-10: 8 mg via INTRAVENOUS
  Administered 2019-12-10: 1000 mg via INTRAVENOUS
  Filled 2019-12-10: qty 100

## 2019-12-10 MED ORDER — POVIDONE-IODINE 10 % EX SWAB
2.0000 "application " | Freq: Once | CUTANEOUS | Status: AC
  Administered 2019-12-10: 2 via TOPICAL

## 2019-12-10 MED ORDER — ACETAMINOPHEN 500 MG PO TABS: 1000.0000 mg | ORAL_TABLET | Freq: Once | ORAL | Status: DC

## 2019-12-10 MED ORDER — ONDANSETRON HCL 4 MG/2ML IJ SOLN
INTRAMUSCULAR | Status: DC | PRN
  Administered 2019-12-10: 4 mg via INTRAVENOUS

## 2019-12-10 MED ORDER — LACTATED RINGERS IV BOLUS: 250.0000 mL | Freq: Once | INTRAVENOUS | Status: DC

## 2019-12-10 MED ORDER — DEXAMETHASONE SODIUM PHOSPHATE 10 MG/ML IJ SOLN: 8.0000 mg | Freq: Once | INTRAMUSCULAR | Status: DC

## 2019-12-10 MED ORDER — GABAPENTIN 300 MG PO CAPS
300.0000 mg | ORAL_CAPSULE | Freq: Once | ORAL | Status: AC
  Administered 2019-12-10: 300 mg via ORAL
  Filled 2019-12-10: qty 1

## 2019-12-10 MED ORDER — PROPOFOL 500 MG/50ML IV EMUL
INTRAVENOUS | Status: AC
  Filled 2019-12-10: qty 50

## 2019-12-10 MED ORDER — PROMETHAZINE HCL 25 MG/ML IJ SOLN: 6.2500 mg | INTRAMUSCULAR | Status: DC | PRN

## 2019-12-10 MED ORDER — LACTATED RINGERS IV BOLUS
500.0000 mL | Freq: Once | INTRAVENOUS | Status: AC
  Administered 2019-12-10: 12:00:00 500 mL via INTRAVENOUS

## 2019-12-10 MED ORDER — ASPIRIN EC 325 MG PO TBEC: 325.0000 mg | DELAYED_RELEASE_TABLET | Freq: Two times a day (BID) | ORAL | 0 refills | Status: AC

## 2019-12-10 MED ORDER — EPHEDRINE SULFATE 50 MG/ML IJ SOLN
INTRAMUSCULAR | Status: DC | PRN
  Administered 2019-12-10 (×2): 5 mg via INTRAVENOUS

## 2019-12-10 MED ORDER — ONDANSETRON HCL 4 MG/2ML IJ SOLN
INTRAMUSCULAR | Status: AC
  Filled 2019-12-10: qty 2

## 2019-12-10 MED ORDER — METHOCARBAMOL 500 MG IVPB - SIMPLE MED: 500.0000 mg | Freq: Four times a day (QID) | INTRAVENOUS | Status: DC | PRN

## 2019-12-10 SURGICAL SUPPLY — 61 items
ARTISURF 10M PLY L 6-9CD KNEE (Knees) ×1 IMPLANT
BAG SPEC THK2 15X12 ZIP CLS (MISCELLANEOUS) ×1
BAG ZIPLOCK 12X15 (MISCELLANEOUS) ×2 IMPLANT
BLADE SAGITTAL 13X1.27X60 (BLADE) ×2 IMPLANT
BLADE SAW SGTL 83.5X18.5 (BLADE) ×2 IMPLANT
BLADE SURG 15 STRL LF DISP TIS (BLADE) ×1 IMPLANT
BLADE SURG 15 STRL SS (BLADE) ×2
BLADE SURG SZ10 CARB STEEL (BLADE) ×4 IMPLANT
BNDG ELASTIC 6X5.8 VLCR STR LF (GAUZE/BANDAGES/DRESSINGS) ×2 IMPLANT
BOWL SMART MIX CTS (DISPOSABLE) ×2 IMPLANT
BSPLAT TIB 5D D CMNT STM LT (Knees) ×1 IMPLANT
CEMENT BONE SIMPLEX SPEEDSET (Cement) ×4 IMPLANT
COVER SURGICAL LIGHT HANDLE (MISCELLANEOUS) ×2 IMPLANT
COVER WAND RF STERILE (DRAPES) IMPLANT
CUFF TOURN SGL QUICK 34 (TOURNIQUET CUFF) ×2
CUFF TRNQT CYL 34X4.125X (TOURNIQUET CUFF) ×1 IMPLANT
DECANTER SPIKE VIAL GLASS SM (MISCELLANEOUS) ×4 IMPLANT
DRAPE INCISE IOBAN 66X45 STRL (DRAPES) ×4 IMPLANT
DRAPE U-SHAPE 47X51 STRL (DRAPES) ×2 IMPLANT
DRSG AQUACEL AG ADV 3.5X10 (GAUZE/BANDAGES/DRESSINGS) ×2 IMPLANT
DURAPREP 26ML APPLICATOR (WOUND CARE) ×4 IMPLANT
ELECT REM PT RETURN 15FT ADLT (MISCELLANEOUS) ×2 IMPLANT
FEMUR  CMT CCR STD SZ7 L KNEE (Knees) ×1 IMPLANT
FEMUR CMT CCR STD SZ7 L KNEE (Knees) ×1 IMPLANT
FEMUR CMTD CCR STD SZ7 L KNEE (Knees) ×1 IMPLANT
GLOVE BIOGEL M STRL SZ7.5 (GLOVE) ×2 IMPLANT
GLOVE BIOGEL PI IND STRL 7.5 (GLOVE) ×1 IMPLANT
GLOVE BIOGEL PI IND STRL 8.5 (GLOVE) ×2 IMPLANT
GLOVE BIOGEL PI INDICATOR 7.5 (GLOVE) ×1
GLOVE BIOGEL PI INDICATOR 8.5 (GLOVE) ×2
GLOVE SURG ORTHO 8.0 STRL STRW (GLOVE) ×6 IMPLANT
GOWN STRL REUS W/ TWL XL LVL3 (GOWN DISPOSABLE) ×2 IMPLANT
GOWN STRL REUS W/TWL XL LVL3 (GOWN DISPOSABLE) ×4
HANDPIECE INTERPULSE COAX TIP (DISPOSABLE) ×2
HDLS TROCR DRIL PIN KNEE 75 (PIN) ×1
HOLDER FOLEY CATH W/STRAP (MISCELLANEOUS) IMPLANT
HOOD PEEL AWAY FLYTE STAYCOOL (MISCELLANEOUS) ×6 IMPLANT
KIT TURNOVER KIT A (KITS) IMPLANT
MANIFOLD NEPTUNE II (INSTRUMENTS) ×2 IMPLANT
NEEDLE HYPO 22GX1.5 SAFETY (NEEDLE) ×2 IMPLANT
NS IRRIG 1000ML POUR BTL (IV SOLUTION) ×2 IMPLANT
PACK TOTAL KNEE CUSTOM (KITS) ×2 IMPLANT
PENCIL SMOKE EVACUATOR (MISCELLANEOUS) IMPLANT
PIN DRILL HDLS TROCAR 75 4PK (PIN) ×1 IMPLANT
PROTECTOR NERVE ULNAR (MISCELLANEOUS) ×1 IMPLANT
SET HNDPC FAN SPRY TIP SCT (DISPOSABLE) ×1 IMPLANT
STEM POLY PAT PLY 32M KNEE (Knees) ×1 IMPLANT
STEM TIBIA 5 DEG SZ D L KNEE (Knees) ×1 IMPLANT
STRIP CLOSURE SKIN 1/2X4 (GAUZE/BANDAGES/DRESSINGS) ×2 IMPLANT
SUT BONE WAX W31G (SUTURE) ×2 IMPLANT
SUT MNCRL AB 3-0 PS2 18 (SUTURE) ×2 IMPLANT
SUT STRATAFIX 0 PDS 27 VIOLET (SUTURE) ×2
SUT STRATAFIX PDS+ 0 24IN (SUTURE) ×2 IMPLANT
SUT VIC AB 1 CT1 36 (SUTURE) ×2 IMPLANT
SUTURE STRATFX 0 PDS 27 VIOLET (SUTURE) ×1 IMPLANT
SYR CONTROL 10ML LL (SYRINGE) ×4 IMPLANT
TIBIA STEM 5 DEG SZ D L KNEE (Knees) ×2 IMPLANT
TRAY FOLEY MTR SLVR 14FR STAT (SET/KITS/TRAYS/PACK) ×1 IMPLANT
WATER STERILE IRR 1000ML POUR (IV SOLUTION) ×4 IMPLANT
WRAP KNEE MAXI GEL POST OP (GAUZE/BANDAGES/DRESSINGS) ×2 IMPLANT
YANKAUER SUCT BULB TIP 10FT TU (MISCELLANEOUS) ×2 IMPLANT

## 2019-12-10 NOTE — Evaluation (Addendum)
Physical Therapy Evaluation Patient Details Name: Lisa Petersen MRN: [id number] DOB: [birth year] Today's Date: 12/10/2019   History of Present Illness  Patient is 84 y.o. female s/p Lt TKA on 12/10/19 with PMH significant for HLD, hypothyroidism, osteopenia, OA, depression, anxiety, and Rt TKA.    Clinical Impression  Lisa Petersen is a 84 y.o. female POD 0 s/p Lt TKA. Patient reports indepenence with mobility at baseline. Patient is now limited by functional impairments (see PT problem list below) and requires supervision/min guard for transfers and gait with RW. Patient was able to ambulate ~110 feet with RW and supervision and cues for safe walker management. Patient educated on safe sequencing for stair mobility and required min assist to steady and prevent LOB no stairs. She reported feeling somewhat light headed and fatigued. Patient will benefit from continued skilled PT interventions to address impairments and progress towards PLOF. PT will follow up for additional session for education on HEP and additional stair training.    Follow Up Recommendations Follow surgeon's recommendation for DC plan and follow-up therapies    Equipment Recommendations  None recommended by PT    Recommendations for Other Services       Precautions / Restrictions Precautions Precautions: Fall Restrictions Weight Bearing Restrictions: No      Mobility  Bed Mobility Overal bed mobility: Needs Assistance Bed Mobility: Supine to Sit     Supine to sit: HOB elevated;Supervision     General bed mobility comments: no assist required, pt requires extra time  Transfers Overall transfer level: Needs assistance Equipment used: Rolling walker (2 wheeled) Transfers: Sit to/from Stand Sit to Stand: Supervision         General transfer comment: cues for safe hand placement and technique with RW, no assist required for power up from EOB  Ambulation/Gait Ambulation/Gait assistance:  Supervision Gait Distance (Feet): 110 Feet Assistive device: Rolling walker (2 wheeled) Gait Pattern/deviations: Step-through pattern;Decreased stride length;Decreased stance time - left;Decreased step length - right Gait velocity: decreased   General Gait Details: cues for safe hand placement, step pattern, and proximity to RW, no assist required to steady, no overt LOB.  Stairs Stairs: Yes Stairs assistance: Min assist Stair Management: Backwards;With walker;Step to pattern;No rails Number of Stairs: 2 General stair comments: verbal cues for safe step pattern and technique with RW, cues to "up with good, down with bad" and cues for assist position to stabilize RW. pt with noted posterior lean requiring min assist to steady and prevent LOB.  Wheelchair Mobility    Modified Rankin (Stroke Patients Only)       Balance Overall balance assessment: Needs assistance   Sitting balance-Leahy Scale: Good     Standing balance support: During functional activity;Bilateral upper extremity supported Standing balance-Leahy Scale: Fair                Pertinent Vitals/Pain Pain Assessment: 0-10 Pain Score: 4  Pain Location: Lt knee Pain Descriptors / Indicators: Aching;Sore Pain Intervention(s): Monitored during session    Home Living Family/patient expects to be discharged to:: Private residence Living Arrangements: Alone Available Help at Discharge: Family;Friend(s);Available 24 hours/day Type of Home: House Home Access: Stairs to enter Entrance Stairs-Rails: None Entrance Stairs-Number of Steps: 2 Home Layout: One level Home Equipment: Walker - 2 wheels;Cane - single point Additional Comments: pt's son is going to help her and her friend is going to stay with her for as many nights as needed.    Prior Function Level of Independence: Independent  Hand Dominance   Dominant Hand: Right    Extremity/Trunk Assessment   Upper Extremity  Assessment Upper Extremity Assessment: Overall WFL for tasks assessed    Lower Extremity Assessment Lower Extremity Assessment: Overall WFL for tasks assessed;LLE deficits/detail LLE Deficits / Details: no extensor lag with SLR LLE Sensation: WNL LLE Coordination: WNL    Cervical / Trunk Assessment Cervical / Trunk Assessment: Normal  Communication   Communication: No difficulties  Cognition Arousal/Alertness: Awake/alert Behavior During Therapy: WFL for tasks assessed/performed Overall Cognitive Status: Within Functional Limits for tasks assessed             General Comments      Exercises     Assessment/Plan    PT Assessment Patient needs continued PT services  PT Problem List Decreased strength;Decreased activity tolerance;Decreased balance;Decreased range of motion;Decreased knowledge of use of DME;Decreased mobility       PT Treatment Interventions DME instruction;Functional mobility training;Therapeutic activities;Stair training;Therapeutic exercise;Gait training;Balance training;Patient/family education    PT Goals (Current goals can be found in the Care Plan section)  Acute Rehab PT Goals Patient Stated Goal: go home today PT Goal Formulation: With patient Time For Goal Achievement: 12/17/19 Potential to Achieve Goals: Fair    Frequency 7X/week    AM-PAC PT "6 Clicks" Mobility  Outcome Measure Help needed turning from your back to your side while in a flat bed without using bedrails?: A Little Help needed moving from lying on your back to sitting on the side of a flat bed without using bedrails?: A Little Help needed moving to and from a bed to a chair (including a wheelchair)?: A Little Help needed standing up from a chair using your arms (e.g., wheelchair or bedside chair)?: A Little Help needed to walk in hospital room?: A Little Help needed climbing 3-5 steps with a railing? : A Little 6 Click Score: 18    End of Session Equipment Utilized During  Treatment: Gait belt Activity Tolerance: Patient tolerated treatment well;Patient limited by fatigue(limited by fatigue, pt requesting a meal) Patient left: in chair;with call bell/phone within reach Nurse Communication: Mobility status PT Visit Diagnosis: Muscle weakness (generalized) (M62.81);Difficulty in walking, not elsewhere classified (R26.2)    Time: [id number] PT Time Calculation (min) (ACUTE ONLY): 23 min   Charges:   PT Evaluation $PT Eval Low Complexity: 1 Low PT Treatments $Gait Training: 8-22 mins       Verner Mould, DPT Physical Therapist with [facility] [phone]  12/10/2019 4:46 PM

## 2019-12-10 NOTE — Transfer of Care (Signed)
Immediate Anesthesia Transfer of Care Note  Patient: Lisa Petersen  Procedure(s) Performed: TOTAL KNEE ARTHROPLASTY (Left Knee)  Patient Location: PACU  Anesthesia Type:Spinal and MAC combined with regional for post-op pain  Level of Consciousness: awake, alert , oriented and patient cooperative  Airway & Oxygen Therapy: Patient Spontanous Breathing and Patient connected to nasal cannula oxygen  Post-op Assessment: Report given to RN and Post -op Vital signs reviewed and stable  Post vital signs: Reviewed and stable  Last Vitals:  Vitals Value Taken Time  BP 126/65 12/10/19 1115  Temp    Pulse 66 12/10/19 1116  Resp 7 12/10/19 1116  SpO2 98 % 12/10/19 1116  Vitals shown include unvalidated device data.  Last Pain:  Vitals:   12/10/19 0702  TempSrc: Oral         Complications: No apparent anesthesia complications

## 2019-12-10 NOTE — Op Note (Signed)
TOTAL KNEE REPLACEMENT OPERATIVE NOTE:  12/10/2019  11:23 AM  PATIENT:  Lisa Petersen  84 y.o. female  PRE-OPERATIVE DIAGNOSIS:  Primary Osteoarthritis Left Knee  POST-OPERATIVE DIAGNOSIS:  Primary Osteoarthritis Left Knee  PROCEDURE:  Procedure(s): TOTAL KNEE ARTHROPLASTY  SURGEON:  Surgeon(s): Vickey Huger, MD  PHYSICIAN ASSISTANT: Carlyon Shadow, PA-C  ANESTHESIA:   spinal  SPECIMEN: None  COUNTS:  Correct  TOURNIQUET:   Total Tourniquet Time Documented: Thigh (Left) - 37 minutes Total: Thigh (Left) - 37 minutes   DICTATION:  Indication for procedure:    The patient is a 84 y.o. female who has failed conservative treatment for Primary Osteoarthritis Left Knee.  Informed consent was obtained prior to anesthesia. The risks versus benefits of the operation were explain and in a way the patient can, and did, understand.    Description of procedure:     The patient was taken to the operating room and placed under anesthesia.  The patient was positioned in the usual fashion taking care that all body parts were adequately padded and/or protected.  A tourniquet was applied and the leg prepped and draped in the usual sterile fashion.  The extremity was exsanguinated with the esmarch and tourniquet inflated to 350 mmHg.  Pre-operative range of motion was normal.    A midline incision approximately 6-7 inches long was made with a #10 blade.  A new blade was used to make a parapatellar arthrotomy going 2-3 cm into the quadriceps tendon, over the patella, and alongside the medial aspect of the patellar tendon.  A synovectomy was then performed with the #10 blade and forceps. I then elevated the deep MCL off the medial tibial metaphysis subperiosteally around to the semimembranosus attachment.    I everted the patella and used calipers to measure patellar thickness.  I used the reamer to ream down to appropriate thickness to recreate the native thickness.  I then removed excess bone  with the rongeur and sagittal saw.  I used the appropriately sized template and drilled the three lug holes.  I then put the trial in place and measured the thickness with the calipers to ensure recreation of the native thickness.  The trial was then removed and the patella subluxed and the knee brought into flexion.  A homan retractor was place to retract and protect the patella and lateral structures.  A Z-retractor was place medially to protect the medial structures.  The extra-medullary alignment system was used to make cut the tibial articular surface perpendicular to the anamotic axis of the tibia and in 3 degrees of posterior slope.  The cut surface and alignment jig was removed.  I then used the intramedullary alignment guide to make a valgus cut on the distal femur.  I then marked out the epicondylar axis on the distal femur.    I then used the anterior referencing sizer and measured the femur to be a size 7.  The 4-In-1 cutting block was screwed into place in external rotation matching the posterior condylar angle, making our cuts perpendicular to the epicondylar axis.  Anterior, posterior and chamfer cuts were made with the sagittal saw.  The cutting block and cut pieces were removed.  A lamina spreader was placed in 90 degrees of flexion.  The ACL, PCL, menisci, and posterior condylar osteophytes were removed.  A 10 mm spacer blocked was found to offer good flexion and extension gap balance after minimal in degree releasing.   The scoop retractor was then placed and the  femoral finishing block was pinned in place.  The small sagittal saw was used as well as the lug drill to finish the femur.  The block and cut surfaces were removed and the medullary canal hole filled with autograft bone from the cut pieces.  The tibia was delivered forward in deep flexion and external rotation.  A size D tray was selected and pinned into place centered on the medial 1/3 of the tibial tubercle.  The reamer and  keel was used to prepare the tibia through the tray.    I then trialed with the size 7 femur, size D tibia, a 10 mm insert and the 32 patella.  I had excellent flexion/extension gap balance, excellent patella tracking.  Flexion was full and beyond 120 degrees; extension was zero.  These components were chosen and the staff opened them to me on the back table while the knee was lavaged copiously and the cement mixed.  The soft tissue was infiltrated with 60cc of exparel 1.3% through a 21 gauge needle.  I cemented in the components and removed all excess cement.  The polyethylene tibial component was snapped into place and the knee placed in extension while cement was hardening.  The capsule was infilltrated with a 60cc exparel/marcaine/saline mixture.   Once the cement was hard, the tourniquet was let down.  Hemostasis was obtained.  The arthrotomy was closed using a #1 stratofix running suture.  The deep soft tissues were closed with #0 vicryls and the subcuticular layer closed with #2-0 vicryl.  The skin was reapproximated and closed with 3.0 Monocryl.  The wound was covered with steristrips, aquacel dressing, and a TED stocking.   The patient was then awakened, extubated, and taken to the recovery room in stable condition.  BLOOD LOSS:  0000000 COMPLICATIONS:  None.  PLAN OF CARE: Discharge to home after PACU  PATIENT DISPOSITION:  PACU - hemodynamically stable.    Please fax a copy of this op note to my office at [phone] (please only include page 1 and 2 of the Case Information op note)

## 2019-12-10 NOTE — Progress Notes (Signed)
Assisted Dr. Singer with left, ultrasound guided, adductor canal block. Side rails up, monitors on throughout procedure. See vital signs in flow sheet. Tolerated Procedure well.  

## 2019-12-10 NOTE — H&P (Signed)
Lisa Petersen MRN:  [id number] DOB/SEX:  Mar 25, 1936/female  CHIEF COMPLAINT:  Painful left Knee  HISTORY: Patient is a 84 y.o. female presented with a history of pain in the left knee. Onset of symptoms was gradual starting a few years ago with gradually worsening course since that time. Patient has been treated conservatively with over-the-counter NSAIDs and activity modification. Patient currently rates pain in the knee at 10 out of 10 with activity. There is pain at night.  PAST MEDICAL HISTORY: Patient Active Problem List   Diagnosis Date Noted  . UTI (urinary tract infection) 10/17/2019  . Axillary fullness 07/19/2019  . Borderline blood pressure 03/09/2018  . Bilateral finger numbness 04/06/2017  . Osteoarthritis 04/06/2017  . Encounter for general adult medical examination with abnormal findings 10/07/2016  . Anxiety and depression 05/21/2016  . History of squamous cell carcinoma of skin 04/07/2016  . Insomnia 06/17/2015  . Hypothyroidism 11/14/2014   Past Medical History:  Diagnosis Date  . Allergy   . Anxiety   . Colon polyps   . Depression   . History of Clostridioides difficile colitis 2014  . History of endometriosis   . History of Graves' disease   . History of jaundice as a child    age 36  . Hyperlipidemia   . Hypothyroidism   . Insomnia   . OA (osteoarthritis)   . Osteopenia   . Plantar fasciitis   . Rheumatoid arthritis ([place])   . Sigmoid diverticulosis 2008  . Squamous cell carcinoma in situ 2017   Central chest  . UTI (lower urinary tract infection)   . Vitamin B 12 deficiency    Past Surgical History:  Procedure Laterality Date  . ABDOMINAL HYSTERECTOMY  1975   Bilateral oophorectomy  . CATARACT EXTRACTION Bilateral 2009  . COLECTOMY Right 2006  . COLONOSCOPY  2015  . KNEE ARTHROSCOPY  2011   after knee replacement  . REPLACEMENT TOTAL KNEE Right 2011  . SKIN CANCER EXCISION  2017  . TONSILLECTOMY AND ADENOIDECTOMY  1950     MEDICATIONS:    Medications Prior to Admission  Medication Sig Dispense Refill Last Dose  . Calcium Carbonate-Vitamin D (CALTRATE 600+D PO) Take 2 tablets by mouth daily.      . Cyanocobalamin (VITAMIN B-12 PO) Take 2,500 mcg by mouth daily.      Marland Kitchen levothyroxine (SYNTHROID, LEVOTHROID) 112 MCG tablet Take 1 tablet by mouth  daily before breakfast (Patient taking differently: Take 112 mcg by mouth daily before breakfast. ) 90 tablet 3   . Misc Natural Products (OSTEO BI-FLEX JOINT SHIELD PO) Take 1 tablet by mouth 2 (two) times daily.      . Multiple Vitamins-Minerals (CENTRUM SILVER PO) Take 1 tablet by mouth daily.      . Multiple Vitamins-Minerals (PRESERVISION AREDS 2+MULTI VIT PO) Take 1 capsule by mouth 2 (two) times daily.     . traZODone (DESYREL) 50 MG tablet TAKE 1 TABLET AT BEDTIME ASNEEDED FOR SLEEP (Patient taking differently: Take 50 mg by mouth at bedtime. ) 90 tablet 1   . fluticasone (FLONASE) 50 MCG/ACT nasal spray Place 2 sprays into both nostrils daily. 16 g 6   . nitrofurantoin, macrocrystal-monohydrate, (MACROBID) 100 MG capsule Take 1 capsule (100 mg total) by mouth 2 (two) times daily. 10 capsule 0 Not Taking at Unknown time    ALLERGIES:   Allergies  Allergen Reactions  . Amoxicillin-Pot Clavulanate Diarrhea  . Etodolac Other (See Comments)    May have raised transaminases    .  Morphine And Related Nausea And Vomiting  . Oxycontin [Oxycodone Hcl] Nausea Only  . Cephalexin Rash  . Metronidazole Nausea Only and Other (See Comments)    dyspepsia   . Sulfa Antibiotics Rash    REVIEW OF SYSTEMS:  A comprehensive review of systems was negative except for: Musculoskeletal: positive for arthralgias and bone pain   FAMILY HISTORY:   Family History  Problem Relation Age of Onset  . Hypertension Mother   . Hypertension Sister   . Mental illness Sister   . Dementia Sister   . Diabetes Brother   . Cancer Brother        Kidney/Bladder  . Dementia Father   . Parkinson's  disease Brother   . Breast cancer Neg Hx     SOCIAL HISTORY:   Social History   Tobacco Use  . Smoking status: Never Smoker  . Smokeless tobacco: Never Used  Substance Use Topics  . Alcohol use: No     EXAMINATION:  Vital signs in last 24 hours:    There were no vitals taken for this visit.  General Appearance:    Alert, cooperative, no distress, appears stated age  Head:    Normocephalic, without obvious abnormality, atraumatic  Eyes:    PERRL, conjunctiva/corneas clear, EOM's intact, fundi    benign, both eyes  Ears:    Normal TM's and external ear canals, both ears  Nose:   Nares normal, septum midline, mucosa normal, no drainage    or sinus tenderness  Throat:   Lips, mucosa, and tongue normal; teeth and gums normal  Neck:   Supple, symmetrical, trachea midline, no adenopathy;    thyroid:  no enlargement/tenderness/nodules; no carotid   bruit or JVD  Back:     Symmetric, no curvature, ROM normal, no CVA tenderness  Lungs:     Clear to auscultation bilaterally, respirations unlabored  Chest Wall:    No tenderness or deformity   Heart:    Regular rate and rhythm, S1 and S2 normal, no murmur, rub   or gallop  Breast Exam:    No tenderness, masses, or nipple abnormality  Abdomen:     Soft, non-tender, bowel sounds active all four quadrants,    no masses, no organomegaly  Genitalia:    Normal female without lesion, discharge or tenderness  Rectal:    Normal tone, normal prostate, no masses or tenderness;   guaiac negative stool  Extremities:   Extremities normal, atraumatic, no cyanosis or edema  Pulses:   2+ and symmetric all extremities  Skin:   Skin color, texture, turgor normal, no rashes or lesions  Lymph nodes:   Cervical, supraclavicular, and axillary nodes normal  Neurologic:   CNII-XII intact, normal strength, sensation and reflexes    throughout    Musculoskeletal:  ROM 0-120, Ligaments intact,  Imaging Review Plain radiographs demonstrate severe  degenerative joint disease of the left knee. The overall alignment is neutral. The bone quality appears to be good for age and reported activity level.  Assessment/Plan: Primary osteoarthritis, left knee   The patient history, physical examination and imaging studies are consistent with advanced degenerative joint disease of the left knee. The patient has failed conservative treatment.  The clearance notes were reviewed.  After discussion with the patient it was felt that Total Knee Replacement was indicated. The procedure,  risks, and benefits of total knee arthroplasty were presented and reviewed. The risks including but not limited to aseptic loosening, infection, blood clots, vascular injury, stiffness, patella  tracking problems complications among others were discussed. The patient acknowledged the explanation, agreed to proceed with the plan.  Preoperative templating of the joint replacement has been completed, documented, and submitted to the Operating Room personnel in order to optimize intra-operative equipment management.    Patient's anticipated LOS is less than 2 midnights, meeting these requirements: - Lives within 1 hour of care - Has a competent adult at home to recover with post-op recover - NO history of  - Chronic pain requiring opiods  - Diabetes  - Coronary Artery Disease  - Heart failure  - Heart attack  - Stroke  - DVT/VTE  - Cardiac arrhythmia  - Respiratory Failure/COPD  - Renal failure  - Anemia  - Advanced Liver disease       Donia Ast 12/10/2019, 6:57 AM

## 2019-12-10 NOTE — Progress Notes (Signed)
Physical Therapy Treatment Patient Details Name: Lisa Petersen MRN: [id number] DOB: 03-22-1936 Today's Date: 12/10/2019    History of Present Illness Patient is 84 y.o. female s/p Lt TKA on 12/10/19 with PMH significant for HLD, hypothyroidism, osteopenia, OA, depression, anxiety, and Rt TKA.    PT Comments    Lisa Petersen is a 84 y.o. female POD 0 s/p Lt TKA. Patient seen for additional therapy session for review of stair mobility and instruction in HEP exercises. Patient remains limited by functional impairments (see PT problem list below) and requires supervision for transfers and gait with RW. Patient was able to ambulate ~100 feet with RW and supervision and cues for safe walker management. Patient continued with training for safe sequencing for stair mobility and demonstrated safe pattern and verbalized safe guarding position for people assisting with mobility. Patient instructed in exercises to facilitate ROM and circulation. Patient will benefit from continued skilled PT interventions to address impairments and progress towards PLOF. Patient has met mobility goals at adequate level for discharge home; will continue to follow if pt continues acute stay to progress towards Mod I goals.     Follow Up Recommendations  Follow surgeon's recommendation for DC plan and follow-up therapies     Equipment Recommendations  None recommended by PT    Recommendations for Other Services       Precautions / Restrictions Precautions Precautions: Fall Restrictions Weight Bearing Restrictions: No    Mobility  Bed Mobility Overal bed mobility: Needs Assistance Bed Mobility: Supine to Sit     Supine to sit: HOB elevated;Supervision     General bed mobility comments: Pt OOB at start in recliner  Transfers     General transfer comment: Pt OOB at start in recliner  Ambulation/Gait Ambulation/Gait assistance: Supervision Gait Distance (Feet): 90 Feet Assistive device: Rolling walker  (2 wheeled) Gait Pattern/deviations: Step-through pattern;Decreased stride length;Decreased stance time - left;Decreased step length - right Gait velocity: decreased   General Gait Details: pt with good carryover for safe hand placement and technique with step pattern in RW, pt maintained safe proximity. no overt LOB noted.   Stairs Stairs: Yes Stairs assistance: Min guard Stair Management: Backwards;With walker;Step to pattern;No rails Number of Stairs: 2 General stair comments: pt with improved posture and step pattern. no posterior lean and good step sequencing. Pt verbalized understanding for safe guarding position for those assisting her. cues for "up with good, down" with bad required initially.   Wheelchair Mobility    Modified Rankin (Stroke Patients Only)       Balance Overall balance assessment: Needs assistance   Sitting balance-Leahy Scale: Good     Standing balance support: During functional activity;Bilateral upper extremity supported Standing balance-Leahy Scale: Fair             Cognition Arousal/Alertness: Awake/alert Behavior During Therapy: WFL for tasks assessed/performed Overall Cognitive Status: Within Functional Limits for tasks assessed                 Exercises Total Joint Exercises Ankle Circles/Pumps: AROM;10 reps;Seated;Both Quad Sets: AROM;5 reps;Seated;Left Short Arc Quad: AROM;5 reps;Seated;Left Heel Slides: AAROM;5 reps;Seated;Left Hip ABduction/ADduction: AROM;5 reps;Seated;Left Straight Leg Raises: AROM;5 reps;Seated;Left Long Arc Quad: AROM;5 reps;Seated;Left Knee Flexion: AROM;Seated;Left;AAROM;10 reps    General Comments        Pertinent Vitals/Pain Pain Assessment: 0-10 Pain Score: 3  Pain Location: Lt knee Pain Descriptors / Indicators: Aching;Sore Pain Intervention(s): Monitored during session    Home Living Family/patient expects to be discharged to::  Private residence Living Arrangements: Alone Available  Help at Discharge: Family;Friend(s);Available 24 hours/day Type of Home: House Home Access: Stairs to enter Entrance Stairs-Rails: None Home Layout: One level Home Equipment: Environmental consultant - 2 wheels;Cane - single point Additional Comments: pt's son is going to help her and her friend is going to stay with her for as many nights as needed.    Prior Function Level of Independence: Independent          PT Goals (current goals can now be found in the care plan section) Acute Rehab PT Goals Patient Stated Goal: go home today PT Goal Formulation: With patient Time For Goal Achievement: 12/17/19 Potential to Achieve Goals: Fair Progress towards PT goals: Progressing toward goals    Frequency    7X/week      PT Plan Current plan remains appropriate       AM-PAC PT "6 Clicks" Mobility   Outcome Measure  Help needed turning from your back to your side while in a flat bed without using bedrails?: A Little Help needed moving from lying on your back to sitting on the side of a flat bed without using bedrails?: A Little Help needed moving to and from a bed to a chair (including a wheelchair)?: A Little Help needed standing up from a chair using your arms (e.g., wheelchair or bedside chair)?: A Little Help needed to walk in hospital room?: A Little Help needed climbing 3-5 steps with a railing? : A Little 6 Click Score: 18    End of Session Equipment Utilized During Treatment: Gait belt Activity Tolerance: Patient tolerated treatment well Patient left: in chair;with call bell/phone within reach Nurse Communication: Mobility status PT Visit Diagnosis: Muscle weakness (generalized) (M62.81);Difficulty in walking, not elsewhere classified (R26.2)     Time: 6759-1638 PT Time Calculation (min) (ACUTE ONLY): 19 min  Charges:   $Therapeutic Exercise: 8-22 mins                    Verner Mould, DPT Physical Therapist with [facility] [phone]  12/10/2019 4:58  PM

## 2019-12-10 NOTE — Progress Notes (Signed)
PT at the bedside at this time. Pt id alert, calm and pleasant and has no s/s of distress.

## 2019-12-10 NOTE — Anesthesia Postprocedure Evaluation (Signed)
Anesthesia Post Note  Patient: Lisa Petersen  Procedure(s) Performed: TOTAL KNEE ARTHROPLASTY (Left Knee)     Patient location during evaluation: PACU Anesthesia Type: MAC Level of consciousness: awake and alert Pain management: pain level controlled Vital Signs Assessment: post-procedure vital signs reviewed and stable Respiratory status: spontaneous breathing and respiratory function stable Cardiovascular status: blood pressure returned to baseline and stable Postop Assessment: spinal receding Anesthetic complications: no    Last Vitals:  Vitals:   12/10/19 1215 12/10/19 1230  BP: 125/66 128/69  Pulse: (!) 55 62  Resp: 11 13  Temp:    SpO2: 100% 100%    Last Pain:  Vitals:   12/10/19 1245  TempSrc:   PainSc: 0-No pain                 Destini Cambre DANIEL

## 2019-12-10 NOTE — Anesthesia Procedure Notes (Signed)
Spinal  Patient location during procedure: OR Start time: 12/10/2019 9:39 AM End time: 12/10/2019 9:44 AM Staffing Performed: resident/CRNA  Resident/CRNA: Garrel Ridgel, CRNA Preanesthetic Checklist Completed: patient identified, IV checked, site marked, risks and benefits discussed, surgical consent, monitors and equipment checked, pre-op evaluation and timeout performed Spinal Block Patient position: sitting Prep: ChloraPrep Patient monitoring: heart rate, continuous pulse ox and blood pressure Approach: midline Location: L3-4 Injection technique: single-shot Needle Needle type: Pencan  Needle gauge: 24 G Needle length: 9 cm Needle insertion depth: 4 cm Assessment Sensory level: T10

## 2019-12-11 ENCOUNTER — Encounter: Payer: Self-pay | Admitting: *Deleted

## 2019-12-13 DIAGNOSIS — M6281 Muscle weakness (generalized): Secondary | ICD-10-CM | POA: Diagnosis not present

## 2019-12-13 DIAGNOSIS — M25662 Stiffness of left knee, not elsewhere classified: Secondary | ICD-10-CM | POA: Diagnosis not present

## 2019-12-13 DIAGNOSIS — R262 Difficulty in walking, not elsewhere classified: Secondary | ICD-10-CM | POA: Diagnosis not present

## 2019-12-13 DIAGNOSIS — M25462 Effusion, left knee: Secondary | ICD-10-CM | POA: Diagnosis not present

## 2019-12-18 DIAGNOSIS — M6281 Muscle weakness (generalized): Secondary | ICD-10-CM | POA: Diagnosis not present

## 2019-12-18 DIAGNOSIS — M25662 Stiffness of left knee, not elsewhere classified: Secondary | ICD-10-CM | POA: Diagnosis not present

## 2019-12-18 DIAGNOSIS — M25462 Effusion, left knee: Secondary | ICD-10-CM | POA: Diagnosis not present

## 2019-12-18 DIAGNOSIS — R262 Difficulty in walking, not elsewhere classified: Secondary | ICD-10-CM | POA: Diagnosis not present

## 2019-12-20 ENCOUNTER — Other Ambulatory Visit: Payer: Self-pay | Admitting: Family Medicine

## 2019-12-20 DIAGNOSIS — M6281 Muscle weakness (generalized): Secondary | ICD-10-CM | POA: Diagnosis not present

## 2019-12-20 DIAGNOSIS — M25662 Stiffness of left knee, not elsewhere classified: Secondary | ICD-10-CM | POA: Diagnosis not present

## 2019-12-20 DIAGNOSIS — M25562 Pain in left knee: Secondary | ICD-10-CM | POA: Diagnosis not present

## 2019-12-20 DIAGNOSIS — R262 Difficulty in walking, not elsewhere classified: Secondary | ICD-10-CM | POA: Diagnosis not present

## 2019-12-20 DIAGNOSIS — Z96652 Presence of left artificial knee joint: Secondary | ICD-10-CM | POA: Diagnosis not present

## 2019-12-25 DIAGNOSIS — M6281 Muscle weakness (generalized): Secondary | ICD-10-CM | POA: Diagnosis not present

## 2019-12-25 DIAGNOSIS — M25462 Effusion, left knee: Secondary | ICD-10-CM | POA: Diagnosis not present

## 2019-12-25 DIAGNOSIS — M25662 Stiffness of left knee, not elsewhere classified: Secondary | ICD-10-CM | POA: Diagnosis not present

## 2019-12-25 DIAGNOSIS — R262 Difficulty in walking, not elsewhere classified: Secondary | ICD-10-CM | POA: Diagnosis not present

## 2019-12-27 DIAGNOSIS — R262 Difficulty in walking, not elsewhere classified: Secondary | ICD-10-CM | POA: Diagnosis not present

## 2019-12-27 DIAGNOSIS — M25662 Stiffness of left knee, not elsewhere classified: Secondary | ICD-10-CM | POA: Diagnosis not present

## 2019-12-27 DIAGNOSIS — M25462 Effusion, left knee: Secondary | ICD-10-CM | POA: Diagnosis not present

## 2019-12-27 DIAGNOSIS — M6281 Muscle weakness (generalized): Secondary | ICD-10-CM | POA: Diagnosis not present

## 2020-01-01 DIAGNOSIS — M25662 Stiffness of left knee, not elsewhere classified: Secondary | ICD-10-CM | POA: Diagnosis not present

## 2020-01-01 DIAGNOSIS — R262 Difficulty in walking, not elsewhere classified: Secondary | ICD-10-CM | POA: Diagnosis not present

## 2020-01-01 DIAGNOSIS — M6281 Muscle weakness (generalized): Secondary | ICD-10-CM | POA: Diagnosis not present

## 2020-01-01 DIAGNOSIS — M25462 Effusion, left knee: Secondary | ICD-10-CM | POA: Diagnosis not present

## 2020-01-03 DIAGNOSIS — M25662 Stiffness of left knee, not elsewhere classified: Secondary | ICD-10-CM | POA: Diagnosis not present

## 2020-01-03 DIAGNOSIS — M25462 Effusion, left knee: Secondary | ICD-10-CM | POA: Diagnosis not present

## 2020-01-03 DIAGNOSIS — M6281 Muscle weakness (generalized): Secondary | ICD-10-CM | POA: Diagnosis not present

## 2020-01-03 DIAGNOSIS — R262 Difficulty in walking, not elsewhere classified: Secondary | ICD-10-CM | POA: Diagnosis not present

## 2020-01-08 DIAGNOSIS — M25462 Effusion, left knee: Secondary | ICD-10-CM | POA: Diagnosis not present

## 2020-01-08 DIAGNOSIS — R262 Difficulty in walking, not elsewhere classified: Secondary | ICD-10-CM | POA: Diagnosis not present

## 2020-01-08 DIAGNOSIS — M25662 Stiffness of left knee, not elsewhere classified: Secondary | ICD-10-CM | POA: Diagnosis not present

## 2020-01-08 DIAGNOSIS — M6281 Muscle weakness (generalized): Secondary | ICD-10-CM | POA: Diagnosis not present

## 2020-01-10 DIAGNOSIS — M25462 Effusion, left knee: Secondary | ICD-10-CM | POA: Diagnosis not present

## 2020-01-10 DIAGNOSIS — M25662 Stiffness of left knee, not elsewhere classified: Secondary | ICD-10-CM | POA: Diagnosis not present

## 2020-01-10 DIAGNOSIS — M25562 Pain in left knee: Secondary | ICD-10-CM | POA: Diagnosis not present

## 2020-01-10 DIAGNOSIS — M6281 Muscle weakness (generalized): Secondary | ICD-10-CM | POA: Diagnosis not present

## 2020-01-14 ENCOUNTER — Telehealth: Payer: Self-pay | Admitting: Family Medicine

## 2020-01-14 DIAGNOSIS — M25462 Effusion, left knee: Secondary | ICD-10-CM | POA: Diagnosis not present

## 2020-01-14 DIAGNOSIS — M6281 Muscle weakness (generalized): Secondary | ICD-10-CM | POA: Diagnosis not present

## 2020-01-14 DIAGNOSIS — R262 Difficulty in walking, not elsewhere classified: Secondary | ICD-10-CM | POA: Diagnosis not present

## 2020-01-14 DIAGNOSIS — M25662 Stiffness of left knee, not elsewhere classified: Secondary | ICD-10-CM | POA: Diagnosis not present

## 2020-01-14 NOTE — Telephone Encounter (Signed)
error 

## 2020-01-18 ENCOUNTER — Ambulatory Visit: Payer: Medicare HMO | Admitting: Family Medicine

## 2020-01-25 ENCOUNTER — Other Ambulatory Visit: Payer: Self-pay | Admitting: Family Medicine

## 2020-01-25 DIAGNOSIS — Z1231 Encounter for screening mammogram for malignant neoplasm of breast: Secondary | ICD-10-CM

## 2020-02-07 DIAGNOSIS — M6281 Muscle weakness (generalized): Secondary | ICD-10-CM | POA: Diagnosis not present

## 2020-02-07 DIAGNOSIS — M25662 Stiffness of left knee, not elsewhere classified: Secondary | ICD-10-CM | POA: Diagnosis not present

## 2020-02-07 DIAGNOSIS — M25462 Effusion, left knee: Secondary | ICD-10-CM | POA: Diagnosis not present

## 2020-02-07 DIAGNOSIS — R262 Difficulty in walking, not elsewhere classified: Secondary | ICD-10-CM | POA: Diagnosis not present

## 2020-02-11 ENCOUNTER — Telehealth: Payer: Self-pay | Admitting: Family Medicine

## 2020-02-11 DIAGNOSIS — M25562 Pain in left knee: Secondary | ICD-10-CM | POA: Diagnosis not present

## 2020-02-11 DIAGNOSIS — Z96659 Presence of unspecified artificial knee joint: Secondary | ICD-10-CM | POA: Diagnosis not present

## 2020-02-11 NOTE — Telephone Encounter (Signed)
Pt called in and wanted to see someone today for knee inflammation and swelling.  I spoke with Fransisco Beau and he suggested she go to [facility] walk in clinic. I told the patient this and told her that Dr. Olivia Mackie has appt tomorrow at 2:30 we could get her in and she said she already has an appt tomorrow and couldn't Dr. Caryl Bis just call her in something for the pain. She thinks she is taking too much ibuprofen. I suggested again for pt to go to [facility]. Pt said never mind and hung up.

## 2020-02-12 DIAGNOSIS — M25662 Stiffness of left knee, not elsewhere classified: Secondary | ICD-10-CM | POA: Diagnosis not present

## 2020-02-12 DIAGNOSIS — M6281 Muscle weakness (generalized): Secondary | ICD-10-CM | POA: Diagnosis not present

## 2020-02-12 DIAGNOSIS — R262 Difficulty in walking, not elsewhere classified: Secondary | ICD-10-CM | POA: Diagnosis not present

## 2020-02-12 DIAGNOSIS — M25462 Effusion, left knee: Secondary | ICD-10-CM | POA: Diagnosis not present

## 2020-02-18 DIAGNOSIS — M25662 Stiffness of left knee, not elsewhere classified: Secondary | ICD-10-CM | POA: Diagnosis not present

## 2020-02-18 DIAGNOSIS — R262 Difficulty in walking, not elsewhere classified: Secondary | ICD-10-CM | POA: Diagnosis not present

## 2020-02-18 DIAGNOSIS — M25462 Effusion, left knee: Secondary | ICD-10-CM | POA: Diagnosis not present

## 2020-02-18 DIAGNOSIS — M6281 Muscle weakness (generalized): Secondary | ICD-10-CM | POA: Diagnosis not present

## 2020-02-25 DIAGNOSIS — M25462 Effusion, left knee: Secondary | ICD-10-CM | POA: Diagnosis not present

## 2020-02-25 DIAGNOSIS — M25662 Stiffness of left knee, not elsewhere classified: Secondary | ICD-10-CM | POA: Diagnosis not present

## 2020-02-25 DIAGNOSIS — R262 Difficulty in walking, not elsewhere classified: Secondary | ICD-10-CM | POA: Diagnosis not present

## 2020-02-25 DIAGNOSIS — M6281 Muscle weakness (generalized): Secondary | ICD-10-CM | POA: Diagnosis not present

## 2020-03-04 ENCOUNTER — Ambulatory Visit
Admission: RE | Admit: 2020-03-04 | Discharge: 2020-03-04 | Disposition: A | Discharge status: Home / Self Care | Payer: Medicare HMO | Source: Ambulatory Visit | Attending: Family Medicine | Admitting: Family Medicine

## 2020-03-04 DIAGNOSIS — Z1231 Encounter for screening mammogram for malignant neoplasm of breast: Secondary | ICD-10-CM

## 2020-03-11 DIAGNOSIS — Z96652 Presence of left artificial knee joint: Secondary | ICD-10-CM | POA: Diagnosis not present

## 2020-04-02 ENCOUNTER — Other Ambulatory Visit: Payer: Self-pay | Admitting: Family Medicine

## 2020-05-06 DIAGNOSIS — R69 Illness, unspecified: Secondary | ICD-10-CM | POA: Diagnosis not present

## 2020-05-06 DIAGNOSIS — M810 Age-related osteoporosis without current pathological fracture: Secondary | ICD-10-CM | POA: Diagnosis not present

## 2020-05-06 DIAGNOSIS — R32 Unspecified urinary incontinence: Secondary | ICD-10-CM | POA: Diagnosis not present

## 2020-05-06 DIAGNOSIS — M1990 Unspecified osteoarthritis, unspecified site: Secondary | ICD-10-CM | POA: Diagnosis not present

## 2020-05-06 DIAGNOSIS — G8929 Other chronic pain: Secondary | ICD-10-CM | POA: Diagnosis not present

## 2020-05-06 DIAGNOSIS — G4700 Insomnia, unspecified: Secondary | ICD-10-CM | POA: Diagnosis not present

## 2020-05-06 DIAGNOSIS — E039 Hypothyroidism, unspecified: Secondary | ICD-10-CM | POA: Diagnosis not present

## 2020-05-06 DIAGNOSIS — Z809 Family history of malignant neoplasm, unspecified: Secondary | ICD-10-CM | POA: Diagnosis not present

## 2020-05-06 DIAGNOSIS — R030 Elevated blood-pressure reading, without diagnosis of hypertension: Secondary | ICD-10-CM | POA: Diagnosis not present

## 2020-05-06 DIAGNOSIS — Z8249 Family history of ischemic heart disease and other diseases of the circulatory system: Secondary | ICD-10-CM | POA: Diagnosis not present

## 2020-06-18 ENCOUNTER — Ambulatory Visit (INDEPENDENT_AMBULATORY_CARE_PROVIDER_SITE_OTHER): Payer: Medicare HMO | Admitting: Family Medicine

## 2020-06-18 ENCOUNTER — Other Ambulatory Visit: Payer: Self-pay

## 2020-06-18 ENCOUNTER — Encounter: Payer: Self-pay | Admitting: Family Medicine

## 2020-06-18 VITALS — BP 110/70 | HR 67 | Temp 98.3°F | Ht 67.0 in | Wt 140.0 lb

## 2020-06-18 DIAGNOSIS — Z0000 Encounter for general adult medical examination without abnormal findings: Secondary | ICD-10-CM | POA: Diagnosis not present

## 2020-06-18 DIAGNOSIS — Z780 Asymptomatic menopausal state: Secondary | ICD-10-CM | POA: Diagnosis not present

## 2020-06-18 DIAGNOSIS — Z13220 Encounter for screening for lipoid disorders: Secondary | ICD-10-CM | POA: Diagnosis not present

## 2020-06-18 DIAGNOSIS — E039 Hypothyroidism, unspecified: Secondary | ICD-10-CM

## 2020-06-18 LAB — COMPREHENSIVE METABOLIC PANEL
ALT: 12 U/L (ref 0–35)
AST: 14 U/L (ref 0–37)
Albumin: 4.2 g/dL (ref 3.5–5.2)
Alkaline Phosphatase: 44 U/L (ref 39–117)
BUN: 16 mg/dL (ref 6–23)
CO2: 31 mEq/L (ref 19–32)
Calcium: 9.2 mg/dL (ref 8.4–10.5)
Chloride: 103 mEq/L (ref 96–112)
Creatinine, Ser: 0.88 mg/dL (ref 0.40–1.20)
GFR: 61.53 mL/min (ref >60.00)
Glucose, Bld: 97 mg/dL (ref 70–99)
Potassium: 4.1 mEq/L (ref 3.5–5.1)
Sodium: 139 mEq/L (ref 135–145)
Total Bilirubin: 0.4 mg/dL (ref 0.2–1.2)
Total Protein: 6.2 g/dL (ref 6.0–8.3)

## 2020-06-18 LAB — LIPID PANEL
Cholesterol: 169 mg/dL (ref 0–200)
HDL: 52 mg/dL (ref >39.00)
LDL Cholesterol: 97 mg/dL (ref 0–99)
NonHDL: 116.87
Total CHOL/HDL Ratio: 3
Triglycerides: 98 mg/dL (ref 0.0–149.0)
VLDL: 19.6 mg/dL (ref 0.0–40.0)

## 2020-06-18 LAB — CBC
HCT: 38.6 % (ref 36.0–46.0)
Hemoglobin: 13 g/dL (ref 12.0–15.0)
MCHC: 33.7 g/dL (ref 30.0–36.0)
MCV: 91.6 fl (ref 78.0–100.0)
Platelets: 215 10*3/uL (ref 150.0–400.0)
RBC: 4.21 Mil/uL (ref 3.87–5.11)
RDW: 13.9 % (ref 11.5–15.5)
WBC: 4.1 10*3/uL (ref 4.0–10.5)

## 2020-06-18 LAB — TSH: TSH: 1 u[IU]/mL (ref 0.35–4.50)

## 2020-06-18 LAB — T4, FREE: Free T4: 1.18 ng/dL (ref 0.60–1.60)

## 2020-06-18 MED ORDER — TETANUS-DIPHTHERIA TOXOIDS TD 5-2 LFU IM INJ: 0.5000 mL | INJECTION | Freq: Once | INTRAMUSCULAR | 0 refills | Status: DC

## 2020-06-18 MED ORDER — TETANUS-DIPHTHERIA TOXOIDS TD 5-2 LFU IM INJ: 0.5000 mL | INJECTION | Freq: Once | INTRAMUSCULAR | 0 refills | Status: AC

## 2020-06-18 NOTE — Progress Notes (Signed)
Tommi Rumps, MD Phone: [phone]  Lisa Petersen is a 84 y.o. female who presents today for cpe.  Diet: Generally eats whatever she wants though eats fairly healthy with lots of vegetables and fruits.  Not as much meat.  Not much sweet tea.  Does have some decaf soda.  Mostly drinks water. Exercise: Stays active though no specific exercise Pap smear: Status post hysterectomy, aged out Colonoscopy: Aged out, reports prior history of polyps Mammogram: 03/04/2020 negative Family history-  Colon cancer: No  Breast cancer: No  Ovarian cancer: No Menses: No, status post hysterectomy Vaccines-   Flu: Out of season  Tetanus: Due  Shingles: Up-to-date  COVID19: Up-to-date  Pneumonia: Up-to-date Tobacco use: No Alcohol use: No Illicit Drug use: No Dentist: Yes Ophthalmology: Yes Patient does report chronic issues with urinary leakage particularly if she holds it for too long.   Active Ambulatory Problems    Diagnosis Date Noted  . Hypothyroidism 11/14/2014  . Insomnia 06/17/2015  . History of squamous cell carcinoma of skin 04/07/2016  . Anxiety and depression 05/21/2016  . Routine general medical examination at a health care facility 10/07/2016  . Bilateral finger numbness 04/06/2017  . Osteoarthritis 04/06/2017  . Borderline blood pressure 03/09/2018  . Axillary fullness 07/19/2019   Resolved Ambulatory Problems    Diagnosis Date Noted  . Protracted upper respiratory infection 11/14/2014  . Right lower quadrant abdominal mass 11/14/2014  . Well woman exam 06/12/2015  . Medicare annual wellness visit, subsequent 06/17/2015  . Grief 06/17/2015  . Dizziness and giddiness 11/12/2015  . Cerumen impaction 11/12/2015  . UTI (urinary tract infection) 05/21/2016  . Sinusitis 03/09/2018  . UTI (urinary tract infection) 10/17/2019   Past Medical History:  Diagnosis Date  . Allergy   . Anxiety   . Colon polyps   . Depression   . History of Clostridioides difficile  colitis 2014  . History of endometriosis   . History of Graves' disease   . History of jaundice as a child   . Hyperlipidemia   . OA (osteoarthritis)   . Osteopenia   . Plantar fasciitis   . Rheumatoid arthritis ([place])   . Sigmoid diverticulosis 2008  . Squamous cell carcinoma in situ 2017  . UTI (lower urinary tract infection)   . Vitamin B 12 deficiency     Family History  Problem Relation Age of Onset  . Hypertension Mother   . Hypertension Sister   . Mental illness Sister   . Dementia Sister   . Diabetes Brother   . Cancer Brother        Kidney/Bladder  . Dementia Father   . Parkinson's disease Brother   . Breast cancer Neg Hx     Social History   Socioeconomic History  . Marital status: Widowed    Spouse name: Not on file  . Number of children: Not on file  . Years of education: Not on file  . Highest education level: Not on file  Occupational History  . Not on file  Tobacco Use  . Smoking status: Never Smoker  . Smokeless tobacco: Never Used  Vaping Use  . Vaping Use: Never used  Substance and Sexual Activity  . Alcohol use: No  . Drug use: No  . Sexual activity: Yes    Birth control/protection: None  Other Topics Concern  . Not on file  Social History Narrative   Has a living will scanned into chart.   Social Determinants of Health   Financial  Resource Strain:   . Difficulty of Paying Living Expenses:   Food Insecurity:   . Worried About Charity fundraiser in the Last Year:   . Arboriculturist in the Last Year:   Transportation Needs:   . Film/video editor (Medical):   Marland Kitchen Lack of Transportation (Non-Medical):   Physical Activity:   . Days of Exercise per Week:   . Minutes of Exercise per Session:   Stress:   . Feeling of Stress :   Social Connections:   . Frequency of Communication with Friends and Family:   . Frequency of Social Gatherings with Friends and Family:   . Attends Religious Services:   . Active Member of Clubs or  Organizations:   . Attends Archivist Meetings:   Marland Kitchen Marital Status:   Intimate Partner Violence:   . Fear of Current or Ex-Partner:   . Emotionally Abused:   Marland Kitchen Physically Abused:   . Sexually Abused:     ROS  General:  Negative for nexplained weight loss, fever Skin: Negative for new or changing mole, sore that won't heal HEENT: Negative for trouble hearing, trouble seeing, ringing in ears, mouth sores, hoarseness, change in voice, dysphagia. CV:  Negative for chest pain, dyspnea, edema, palpitations Resp: Negative for cough, dyspnea, hemoptysis GI: Negative for nausea, vomiting, diarrhea, constipation, abdominal pain, melena, hematochezia. GU: Positive for chronic urine incontinence, negative for dysuria, urinary hesitance, hematuria, vaginal or penile discharge, polyuria, sexual difficulty, lumps in testicle or breasts MSK: Negative for muscle cramps or aches, joint pain or swelling Neuro: Negative for headaches, weakness, numbness, dizziness, passing out/fainting Psych: Negative for depression, anxiety, memory problems  Objective  Physical Exam Vitals:   06/18/20 0844  BP: 110/70  Pulse: 67  Temp: 98.3 F (36.8 C)  SpO2: 98%    BP Readings from Last 3 Encounters:  06/18/20 110/70  12/10/19 137/85  12/06/19 (!) 162/71   Wt Readings from Last 3 Encounters:  06/18/20 140 lb (63.5 kg)  12/10/19 139 lb 6.4 oz (63.2 kg)  12/06/19 140 lb 14.4 oz (63.9 kg)    Physical Exam Constitutional:      General: She is not in acute distress.    Appearance: She is not diaphoretic.  Eyes:     Conjunctiva/sclera: Conjunctivae normal.     Pupils: Pupils are equal, round, and reactive to light.  Cardiovascular:     Rate and Rhythm: Normal rate and regular rhythm.     Heart sounds: Normal heart sounds.  Pulmonary:     Effort: Pulmonary effort is normal.     Breath sounds: Normal breath sounds.  Abdominal:     General: Bowel sounds are normal. There is no distension.       Palpations: Abdomen is soft.     Tenderness: There is no abdominal tenderness. There is no guarding or rebound.  Genitourinary:    Comments: Fulton Mole, CMA served as chaperone, bilateral breast with scattered seborrheic keratosis though no other skin changes, no nipple inversion, masses, or tenderness in either breast, no axillary masses bilaterally Musculoskeletal:     Right lower leg: No edema.     Left lower leg: No edema.  Lymphadenopathy:     Cervical: No cervical adenopathy.  Skin:    General: Skin is warm and dry.  Neurological:     Mental Status: She is alert.  Psychiatric:        Mood and Affect: Mood normal.      Assessment/Plan:  Routine general medical examination at a health care facility Physical exam completed.  Encouraged continued healthy diet and activity.  Discussed that she has aged out of colonoscopies.  She will continue yearly mammograms.  Encouraged her to get her tetanus vaccine at the pharmacy.  Other vaccines are up-to-date.  DEXA scan ordered and she will call to schedule.  Lab work as outlined below. Chronic urine incontinence. The patient will monitor this and try to go to the restroom on a more consistent basis.   Orders Placed This Encounter  Procedures  . DG Bone Density    Standing Status:   Future    Standing Expiration Date:   06/18/2021    Order Specific Question:   Reason for Exam (SYMPTOM  OR DIAGNOSIS REQUIRED)    Answer:   postmenopausal estrogen deficiency    Order Specific Question:   Preferred imaging location?    Answer:   [facility]  . Comp Met (CMET)  . Lipid panel  . CBC  . TSH  . T4, free    Meds ordered this encounter  Medications  . DISCONTD: tetanus & diphtheria toxoids, adult, (TENIVAC) 5-2 LFU injection    Sig: Inject 0.5 mLs into the muscle once for 1 dose.    Dispense:  0.5 mL    Refill:  0  . tetanus & diphtheria toxoids, adult, (TENIVAC) 5-2 LFU injection    Sig: Inject 0.5 mLs into the muscle  once for 1 dose.    Dispense:  0.5 mL    Refill:  0    This visit occurred during the SARS-CoV-2 public health emergency.  Safety protocols were in place, including screening questions prior to the visit, additional usage of staff PPE, and extensive cleaning of exam room while observing appropriate contact time as indicated for disinfecting solutions.    Tommi Rumps, MD [place]

## 2020-06-18 NOTE — Patient Instructions (Signed)
Nice to see you. Please continue to stay active and monitor your diet. Please go to the pharmacy to have your tetanus vaccine completed. Please call to schedule your bone density scan.

## 2020-06-18 NOTE — Assessment & Plan Note (Signed)
Physical exam completed.  Encouraged continued healthy diet and activity.  Discussed that she has aged out of colonoscopies.  She will continue yearly mammograms.  Encouraged her to get her tetanus vaccine at the pharmacy.  Other vaccines are up-to-date.  DEXA scan ordered and she will call to schedule.  Lab work as outlined below.

## 2020-06-25 DIAGNOSIS — R69 Illness, unspecified: Secondary | ICD-10-CM | POA: Diagnosis not present

## 2020-07-07 ENCOUNTER — Telehealth: Payer: Self-pay | Admitting: Family Medicine

## 2020-07-07 NOTE — Telephone Encounter (Signed)
Agree with need for evaluation. If she develops worsening symptoms or she develops chest pain or shortness of breath she needs to be evaluation immediately.

## 2020-07-07 NOTE — Telephone Encounter (Signed)
Patient stated both legs are still swollen from being on a plane. Usually the swelling disperses, but this time it has not. She isnt having any sx other than the swelling. Legs are not hurting, pitting and no redness. She really called to try and get an RX of lasix. Appointment was scheduled for tomorrow with Dawson Bills NP. If sx worsen patient stated she will go to ED.

## 2020-07-07 NOTE — Telephone Encounter (Signed)
Pt was on the plane noticed her feet and  legs are swollen. Now she is home, during the day her feet and legs swell. Sent her to [facility] for triage.

## 2020-07-08 ENCOUNTER — Other Ambulatory Visit: Payer: Self-pay

## 2020-07-08 ENCOUNTER — Encounter: Payer: Self-pay | Admitting: Nurse Practitioner

## 2020-07-08 ENCOUNTER — Ambulatory Visit (INDEPENDENT_AMBULATORY_CARE_PROVIDER_SITE_OTHER): Payer: Medicare HMO | Admitting: Nurse Practitioner

## 2020-07-08 VITALS — BP 124/78 | HR 70 | Temp 98.2°F | Ht 67.0 in | Wt 142.0 lb

## 2020-07-08 DIAGNOSIS — R600 Localized edema: Secondary | ICD-10-CM

## 2020-07-08 NOTE — Patient Instructions (Addendum)
For your leg swelling in both legs after sitting on the plane, I recommend compression knee highs- or very snug support stockings to be worn first thing in the morning and taken off at bedtime. Do not wear at bedtime. Elevate your legs when sitting. This should resolve over the next week. You can already see improvement int them. Please cut back on salt intake. Lasix is not recommended for this transient problem and can cause kidney or electrolyte problems.   If the swelling does not continue to improve,  please call back.  Try eating the DASH diet for low salt ideas.       DASH Eating Plan DASH stands for "Dietary Approaches to Stop Hypertension." The DASH eating plan is a healthy eating plan that has been shown to reduce high blood pressure (hypertension). It may also reduce your risk for type 2 diabetes, heart disease, and stroke. The DASH eating plan may also help with weight loss. What are tips for following this plan?  General guidelines  Avoid eating more than 2,300 mg (milligrams) of salt (sodium) a day. If you have hypertension, you may need to reduce your sodium intake to 1,500 mg a day.  Limit alcohol intake to no more than 1 drink a day for nonpregnant women and 2 drinks a day for men. One drink equals 12 oz of beer, 5 oz of wine, or 1 oz of hard liquor.  Work with your health care provider to maintain a healthy body weight or to lose weight. Ask what an ideal weight is for you.  Get at least 30 minutes of exercise that causes your heart to beat faster (aerobic exercise) most days of the week. Activities may include walking, swimming, or biking.  Work with your health care provider or diet and nutrition specialist (dietitian) to adjust your eating plan to your individual calorie needs. Reading food labels   Check food labels for the amount of sodium per serving. Choose foods with less than 5 percent of the Daily Value of sodium. Generally, foods with less than 300 mg of  sodium per serving fit into this eating plan.  To find whole grains, look for the word "whole" as the first word in the ingredient list. Shopping  Buy products labeled as "low-sodium" or "no salt added."  Buy fresh foods. Avoid canned foods and premade or frozen meals. Cooking  Avoid adding salt when cooking. Use salt-free seasonings or herbs instead of table salt or sea salt. Check with your health care provider or pharmacist before using salt substitutes.  Do not fry foods. Cook foods using healthy methods such as baking, boiling, grilling, and broiling instead.  Cook with heart-healthy oils, such as olive, canola, soybean, or sunflower oil. Meal planning  Eat a balanced diet that includes: ? 5 or more servings of fruits and vegetables each day. At each meal, try to fill half of your plate with fruits and vegetables. ? Up to 6-8 servings of whole grains each day. ? Less than 6 oz of lean meat, poultry, or fish each day. A 3-oz serving of meat is about the same size as a deck of cards. One egg equals 1 oz. ? 2 servings of low-fat dairy each day. ? A serving of nuts, seeds, or beans 5 times each week. ? Heart-healthy fats. Healthy fats called Omega-3 fatty acids are found in foods such as flaxseeds and coldwater fish, like sardines, salmon, and mackerel.  Limit how much you eat of the following: ? Canned  or prepackaged foods. ? Food that is high in trans fat, such as fried foods. ? Food that is high in saturated fat, such as fatty meat. ? Sweets, desserts, sugary drinks, and other foods with added sugar. ? Full-fat dairy products.  Do not salt foods before eating.  Try to eat at least 2 vegetarian meals each week.  Eat more home-cooked food and less restaurant, buffet, and fast food.  When eating at a restaurant, ask that your food be prepared with less salt or no salt, if possible. What foods are recommended? The items listed may not be a complete list. Talk with your  dietitian about what dietary choices are best for you. Grains Whole-grain or whole-wheat bread. Whole-grain or whole-wheat pasta. Brown rice. [place]. Bulgur. Whole-grain and low-sodium cereals. Pita bread. Low-fat, low-sodium crackers. Whole-wheat flour tortillas. Vegetables Fresh or frozen vegetables (raw, steamed, roasted, or grilled). Low-sodium or reduced-sodium tomato and vegetable juice. Low-sodium or reduced-sodium tomato sauce and tomato paste. Low-sodium or reduced-sodium canned vegetables. Fruits All fresh, dried, or frozen fruit. Canned fruit in natural juice (without added sugar). Meat and other protein foods Skinless chicken or [place]. Ground chicken or [place]. Pork with fat trimmed off. Fish and seafood. Egg whites. Dried beans, peas, or lentils. Unsalted nuts, nut butters, and seeds. Unsalted canned beans. Lean cuts of beef with fat trimmed off. Low-sodium, lean deli meat. Dairy Low-fat (1%) or fat-free (skim) milk. Fat-free, low-fat, or reduced-fat cheeses. Nonfat, low-sodium ricotta or cottage cheese. Low-fat or nonfat yogurt. Low-fat, low-sodium cheese. Fats and oils Soft margarine without trans fats. Vegetable oil. Low-fat, reduced-fat, or light mayonnaise and salad dressings (reduced-sodium). Canola, safflower, olive, soybean, and sunflower oils. Avocado. Seasoning and other foods Herbs. Spices. Seasoning mixes without salt. Unsalted popcorn and pretzels. Fat-free sweets. What foods are not recommended? The items listed may not be a complete list. Talk with your dietitian about what dietary choices are best for you. Grains Baked goods made with fat, such as croissants, muffins, or some breads. Dry pasta or rice meal packs. Vegetables Creamed or fried vegetables. Vegetables in a cheese sauce. Regular canned vegetables (not low-sodium or reduced-sodium). Regular canned tomato sauce and paste (not low-sodium or reduced-sodium). Regular tomato and vegetable juice (not  low-sodium or reduced-sodium). Angie Fava. Olives. Fruits Canned fruit in a light or heavy syrup. Fried fruit. Fruit in cream or butter sauce. Meat and other protein foods Fatty cuts of meat. Ribs. Fried meat. Berniece Salines. Sausage. Bologna and other processed lunch meats. Salami. Fatback. Hotdogs. Bratwurst. Salted nuts and seeds. Canned beans with added salt. Canned or smoked fish. Whole eggs or egg yolks. Chicken or [place] with skin. Dairy Whole or 2% milk, cream, and half-and-half. Whole or full-fat cream cheese. Whole-fat or sweetened yogurt. Full-fat cheese. Nondairy creamers. Whipped toppings. Processed cheese and cheese spreads. Fats and oils Butter. Stick margarine. Lard. Shortening. Ghee. Bacon fat. Tropical oils, such as coconut, palm kernel, or palm oil. Seasoning and other foods Salted popcorn and pretzels. Onion salt, garlic salt, seasoned salt, table salt, and sea salt. Worcestershire sauce. Tartar sauce. Barbecue sauce. Teriyaki sauce. Soy sauce, including reduced-sodium. Steak sauce. Canned and packaged gravies. Fish sauce. Oyster sauce. Cocktail sauce. Horseradish that you find on the shelf. Ketchup. Mustard. Meat flavorings and tenderizers. Bouillon cubes. Hot sauce and Tabasco sauce. Premade or packaged marinades. Premade or packaged taco seasonings. Relishes. Regular salad dressings. Where to find more information:  National Heart, Lung, and [place]: https://wilson-eaton.com/  American Heart Association: www.heart.org Summary  The  DASH eating plan is a healthy eating plan that has been shown to reduce high blood pressure (hypertension). It may also reduce your risk for type 2 diabetes, heart disease, and stroke.  With the DASH eating plan, you should limit salt (sodium) intake to 2,300 mg a day. If you have hypertension, you may need to reduce your sodium intake to 1,500 mg a day.  When on the DASH eating plan, aim to eat more fresh fruits and vegetables, whole grains, lean proteins,  low-fat dairy, and heart-healthy fats.  Work with your health care provider or diet and nutrition specialist (dietitian) to adjust your eating plan to your individual calorie needs. This information is not intended to replace advice given to you by your health care provider. Make sure you discuss any questions you have with your health care provider. Document Revised: 11/04/2017 Document Reviewed: 11/15/2016 Elsevier Patient Education  2020 [employer].

## 2020-07-08 NOTE — Progress Notes (Signed)
Established Patient Office Visit  Subjective:  Patient ID: Lisa Petersen, female    DOB: November 14, 1936  Age: 84 y.o. MRN: [id number]  CC:  Chief Complaint  Patient presents with  . Acute Visit    legs swollen    HPI Lisa Petersen presents for swollen legs swollen when she got off the plane to [place]. She then went a daily bus tour and did get up and walk. She did not wear support hose or compression knee highs. Her legs always swell when she is on the plane. She used to take some of her husband's medicines to bring  down the swelling. Since she has been home, the swelling is better. She was tight to her knees and now just lower legs- mostly ankles and feet. This is spontaneously improving.  Feet still swollen. No calf asymmetry, no pain. No CP or fever.  She does not eat a high salt diet. Declines compression knee highs   Wt Readings from Last 3 Encounters:  07/08/20 142 lb (64.4 kg)  06/18/20 140 lb (63.5 kg)  12/10/19 139 lb 6.4 oz (63.2 kg)    Past Medical History:  Diagnosis Date  . Allergy   . Anxiety   . Colon polyps   . Depression   . History of Clostridioides difficile colitis 2014  . History of endometriosis   . History of Graves' disease   . History of jaundice as a child    age 29  . Hyperlipidemia   . Hypothyroidism   . Insomnia   . OA (osteoarthritis)   . Osteopenia   . Plantar fasciitis   . Rheumatoid arthritis ([place])   . Sigmoid diverticulosis 2008  . Squamous cell carcinoma in situ 2017   Central chest  . UTI (lower urinary tract infection)   . Vitamin B 12 deficiency     Past Surgical History:  Procedure Laterality Date  . ABDOMINAL HYSTERECTOMY  1975   Bilateral oophorectomy  . CATARACT EXTRACTION Bilateral 2009  . COLECTOMY Right 2006  . COLONOSCOPY  2015  . KNEE ARTHROSCOPY  2011   after knee replacement  . REPLACEMENT TOTAL KNEE Right 2011  . SKIN CANCER EXCISION  2017  . TONSILLECTOMY AND ADENOIDECTOMY  1950  . TOTAL KNEE  ARTHROPLASTY Left 12/10/2019   Procedure: TOTAL KNEE ARTHROPLASTY;  Surgeon: Vickey Huger, MD;  Location: [facility];  Service: Orthopedics;  Laterality: Left;  75 mins needed for length of case    Family History  Problem Relation Age of Onset  . Hypertension Mother   . Hypertension Sister   . Mental illness Sister   . Dementia Sister   . Diabetes Brother   . Cancer Brother        Kidney/Bladder  . Dementia Father   . Parkinson's disease Brother   . Breast cancer Neg Hx     Social History   Socioeconomic History  . Marital status: Widowed    Spouse name: Not on file  . Number of children: Not on file  . Years of education: Not on file  . Highest education level: Not on file  Occupational History  . Not on file  Tobacco Use  . Smoking status: Never Smoker  . Smokeless tobacco: Never Used  Vaping Use  . Vaping Use: Never used  Substance and Sexual Activity  . Alcohol use: No  . Drug use: No  . Sexual activity: Yes    Birth control/protection: None  Other Topics Concern  . Not  on file  Social History Narrative   Has a living will scanned into chart.   Social Determinants of Health   Financial Resource Strain:   . Difficulty of Paying Living Expenses:   Food Insecurity:   . Worried About Charity fundraiser in the Last Year:   . Arboriculturist in the Last Year:   Transportation Needs:   . Film/video editor (Medical):   Marland Kitchen Lack of Transportation (Non-Medical):   Physical Activity:   . Days of Exercise per Week:   . Minutes of Exercise per Session:   Stress:   . Feeling of Stress :   Social Connections:   . Frequency of Communication with Friends and Family:   . Frequency of Social Gatherings with Friends and Family:   . Attends Religious Services:   . Active Member of Clubs or Organizations:   . Attends Archivist Meetings:   Marland Kitchen Marital Status:   Intimate Partner Violence:   . Fear of Current or Ex-Partner:   . Emotionally Abused:   Marland Kitchen Physically  Abused:   . Sexually Abused:     Outpatient Medications Prior to Visit  Medication Sig Dispense Refill  . Calcium Carbonate-Vitamin D (CALTRATE 600+D PO) Take 2 tablets by mouth daily.     . Cyanocobalamin (VITAMIN B-12 PO) Take 2,500 mcg by mouth daily.     . fluticasone (FLONASE) 50 MCG/ACT nasal spray Place 2 sprays into both nostrils daily. 16 g 6  . HYDROcodone-acetaminophen (NORCO/VICODIN) 5-325 MG tablet Take 1-2 tablets by mouth every 6 (six) hours as needed for moderate pain. 50 tablet 0  . levothyroxine (SYNTHROID) 112 MCG tablet TAKE 1 TABLET DAILY BEFORE BREAKFAST 90 tablet 2  . methocarbamol (ROBAXIN) 500 MG tablet Take 1-2 tablets (500-1,000 mg total) by mouth 4 (four) times daily. 60 tablet 0  . Misc Natural Products (OSTEO BI-FLEX JOINT SHIELD PO) Take 1 tablet by mouth 2 (two) times daily.     . Multiple Vitamins-Minerals (CENTRUM SILVER PO) Take 1 tablet by mouth daily.     . Multiple Vitamins-Minerals (PRESERVISION AREDS 2+MULTI VIT PO) Take 1 capsule by mouth 2 (two) times daily.    . traZODone (DESYREL) 50 MG tablet TAKE 1 TABLET AT BEDTIME ASNEEDED FOR SLEEP 90 tablet 1   No facility-administered medications prior to visit.    Allergies  Allergen Reactions  . Amoxicillin-Pot Clavulanate Diarrhea  . Etodolac Other (See Comments)    May have raised transaminases    . Morphine And Related Nausea And Vomiting  . Oxycontin [Oxycodone Hcl] Nausea Only  . Cephalexin Rash  . Metronidazole Nausea Only and Other (See Comments)    dyspepsia   . Sulfa Antibiotics Rash    Review of Systems Pertinent positives noted in history of present illness.   Objective:    Physical Exam Vitals reviewed.  Constitutional:      Appearance: Normal appearance. She is normal weight.  HENT:     Head: Normocephalic.  Eyes:     Conjunctiva/sclera: Conjunctivae normal.     Pupils: Pupils are equal, round, and reactive to light.  Cardiovascular:     Rate and Rhythm: Normal rate  and regular rhythm.     Pulses: Normal pulses.     Heart sounds: Normal heart sounds.  Pulmonary:     Effort: Pulmonary effort is normal.     Breath sounds: Normal breath sounds.  Abdominal:     Palpations: Abdomen is soft.  Tenderness: There is no abdominal tenderness.  Musculoskeletal:        General: Normal range of motion.     Right lower leg: No edema.     Left lower leg: No edema.     Comments: 1+ bilat ankle/feet edema, color, temperature,sensation are normal.  Skin:    General: Skin is warm and dry.  Neurological:     General: No focal deficit present.     Mental Status: She is alert and oriented to person, place, and time.  Psychiatric:        Mood and Affect: Mood normal.        Behavior: Behavior normal.     BP 124/78 (BP Location: Left Arm, Patient Position: Sitting, Cuff Size: Normal)   Pulse 70   Temp 98.2 F (36.8 C) (Oral)   Ht 5\' 7"  (1.702 m)   Wt 142 lb (64.4 kg)   SpO2 96%   BMI 22.24 kg/m  Wt Readings from Last 3 Encounters:  07/08/20 142 lb (64.4 kg)  06/18/20 140 lb (63.5 kg)  12/10/19 139 lb 6.4 oz (63.2 kg)     Health Maintenance Due  Topic Date Due  . TETANUS/TDAP  12/05/2016  . INFLUENZA VACCINE  07/06/2020    There are no preventive care reminders to display for this patient.  Lab Results  Component Value Date   TSH 1.00 06/18/2020   Lab Results  Component Value Date   WBC 4.1 06/18/2020   HGB 13.0 06/18/2020   HCT 38.6 06/18/2020   MCV 91.6 06/18/2020   PLT 215.0 06/18/2020   Lab Results  Component Value Date   NA 139 06/18/2020   K 4.1 06/18/2020   CO2 31 06/18/2020   GLUCOSE 97 06/18/2020   BUN 16 06/18/2020   CREATININE 0.88 06/18/2020   BILITOT 0.4 06/18/2020   ALKPHOS 44 06/18/2020   AST 14 06/18/2020   ALT 12 06/18/2020   PROT 6.2 06/18/2020   ALBUMIN 4.2 06/18/2020   CALCIUM 9.2 06/18/2020   ANIONGAP 8 12/06/2019   GFR 61.53 06/18/2020   Lab Results  Component Value Date   CHOL 169 06/18/2020    Lab Results  Component Value Date   HDL 52.00 06/18/2020   Lab Results  Component Value Date   LDLCALC 97 06/18/2020   Lab Results  Component Value Date   TRIG 98.0 06/18/2020   Lab Results  Component Value Date   CHOLHDL 3 06/18/2020   No results found for: HGBA1C    Assessment & Plan:   Problem List Items Addressed This Visit    None    Visit Diagnoses    Bilateral lower extremity edema    -  Primary     For your leg swelling in both legs after sitting on the plane, I recommend compression knee highs- or very snug support stockings to be worn first thing in the morning and taken off at bedtime. Do not wear at bedtime. Elevate your legs when sitting. This should resolve over the next week. You can already see improvement int them. Please cut back on salt intake. Lasix is not recommended for this transient problem and can cause kidney or electrolyte problems.   If the swelling does not continue to improve,  please call back.  Try eating the DASH diet for low salt ideas.  No orders of the defined types were placed in this encounter.  This visit occurred during the SARS-CoV-2 public health emergency.  Safety protocols were in place,  including screening questions prior to the visit, additional usage of staff PPE, and extensive cleaning of exam room while observing appropriate contact time as indicated for disinfecting solutions.   Follow-up: Return if symptoms worsen or fail to improve.    Denice Paradise, NP

## 2020-07-15 DIAGNOSIS — H5203 Hypermetropia, bilateral: Secondary | ICD-10-CM | POA: Diagnosis not present

## 2020-07-15 DIAGNOSIS — Z9842 Cataract extraction status, left eye: Secondary | ICD-10-CM | POA: Diagnosis not present

## 2020-07-15 DIAGNOSIS — H353132 Nonexudative age-related macular degeneration, bilateral, intermediate dry stage: Secondary | ICD-10-CM | POA: Diagnosis not present

## 2020-07-15 DIAGNOSIS — H26493 Other secondary cataract, bilateral: Secondary | ICD-10-CM | POA: Diagnosis not present

## 2020-07-15 DIAGNOSIS — H52223 Regular astigmatism, bilateral: Secondary | ICD-10-CM | POA: Diagnosis not present

## 2020-07-15 DIAGNOSIS — Z9841 Cataract extraction status, right eye: Secondary | ICD-10-CM | POA: Diagnosis not present

## 2020-07-16 ENCOUNTER — Other Ambulatory Visit: Payer: Self-pay

## 2020-07-16 ENCOUNTER — Ambulatory Visit
Admission: RE | Admit: 2020-07-16 | Discharge: 2020-07-16 | Disposition: A | Discharge status: Home / Self Care | Payer: Medicare HMO | Source: Ambulatory Visit | Attending: Family Medicine | Admitting: Family Medicine

## 2020-07-16 DIAGNOSIS — M85851 Other specified disorders of bone density and structure, right thigh: Secondary | ICD-10-CM | POA: Diagnosis not present

## 2020-07-16 DIAGNOSIS — Z780 Asymptomatic menopausal state: Secondary | ICD-10-CM | POA: Diagnosis not present

## 2020-07-16 DIAGNOSIS — Z8262 Family history of osteoporosis: Secondary | ICD-10-CM | POA: Diagnosis not present

## 2020-07-31 DIAGNOSIS — R69 Illness, unspecified: Secondary | ICD-10-CM | POA: Diagnosis not present

## 2020-08-13 DIAGNOSIS — R69 Illness, unspecified: Secondary | ICD-10-CM | POA: Diagnosis not present

## 2020-08-15 DIAGNOSIS — Z85828 Personal history of other malignant neoplasm of skin: Secondary | ICD-10-CM | POA: Diagnosis not present

## 2020-08-15 DIAGNOSIS — L821 Other seborrheic keratosis: Secondary | ICD-10-CM | POA: Diagnosis not present

## 2020-08-15 DIAGNOSIS — D1801 Hemangioma of skin and subcutaneous tissue: Secondary | ICD-10-CM | POA: Diagnosis not present

## 2020-08-15 DIAGNOSIS — D3610 Benign neoplasm of peripheral nerves and autonomic nervous system, unspecified: Secondary | ICD-10-CM | POA: Diagnosis not present

## 2020-08-27 DIAGNOSIS — R69 Illness, unspecified: Secondary | ICD-10-CM | POA: Diagnosis not present

## 2020-09-02 ENCOUNTER — Other Ambulatory Visit: Payer: Self-pay | Admitting: Family Medicine

## 2020-09-27 ENCOUNTER — Other Ambulatory Visit: Payer: Self-pay | Admitting: Family Medicine

## 2020-11-14 ENCOUNTER — Ambulatory Visit
Admission: EM | Admit: 2020-11-14 | Discharge: 2020-11-14 | Disposition: A | Discharge status: Home / Self Care | Payer: Medicare HMO | Attending: Family Medicine | Admitting: Family Medicine

## 2020-11-14 DIAGNOSIS — J0100 Acute maxillary sinusitis, unspecified: Secondary | ICD-10-CM | POA: Diagnosis not present

## 2020-11-14 MED ORDER — SALINE SPRAY 0.65 % NA SOLN: 1.0000 | NASAL | 0 refills | Status: DC | PRN

## 2020-11-14 MED ORDER — CETIRIZINE HCL 10 MG PO TABS: 5.0000 mg | ORAL_TABLET | Freq: Every day | ORAL | 0 refills | Status: DC

## 2020-11-14 MED ORDER — DOXYCYCLINE HYCLATE 100 MG PO CAPS: 100.0000 mg | ORAL_CAPSULE | Freq: Two times a day (BID) | ORAL | 0 refills | Status: AC

## 2020-11-14 NOTE — ED Provider Notes (Signed)
Lisa Petersen    CSN: [id number] Arrival date & time: 11/14/20  2947      History   Chief Complaint Chief Complaint  Patient presents with  . Sinus Pressure    HPI Lisa Petersen is a 84 y.o. female.   Pt is a 84 year old female that presents with sinus congestion, sinus pressure, headache, bloody mucus from nasal passages, sore throat.  This is been present and worsening since Monday.  Reporting prior to this she was cleaning at the garden doing a lot of leaves.  Symptoms started shortly after that but have worsened she is feeling very fatigued and had low-grade fevers at home.  Has been taking ibuprofen as needed.  Mild cough without chest congestion or shortness of breath.     Past Medical History:  Diagnosis Date  . Allergy   . Anxiety   . Colon polyps   . Depression   . History of Clostridioides difficile colitis 2014  . History of endometriosis   . History of Graves' disease   . History of jaundice as a child    age 31  . Hyperlipidemia   . Hypothyroidism   . Insomnia   . OA (osteoarthritis)   . Osteopenia   . Plantar fasciitis   . Rheumatoid arthritis ([place])   . Sigmoid diverticulosis 2008  . Squamous cell carcinoma in situ 2017   Central chest  . UTI (lower urinary tract infection)   . Vitamin B 12 deficiency     Patient Active Problem List   Diagnosis Date Noted  . Axillary fullness 07/19/2019  . Borderline blood pressure 03/09/2018  . Bilateral finger numbness 04/06/2017  . Osteoarthritis 04/06/2017  . Routine general medical examination at a health care facility 10/07/2016  . Anxiety and depression 05/21/2016  . History of squamous cell carcinoma of skin 04/07/2016  . Insomnia 06/17/2015  . Hypothyroidism 11/14/2014    Past Surgical History:  Procedure Laterality Date  . ABDOMINAL HYSTERECTOMY  1975   Bilateral oophorectomy  . CATARACT EXTRACTION Bilateral 2009  . COLECTOMY Right 2006  . COLONOSCOPY  2015  . KNEE ARTHROSCOPY   2011   after knee replacement  . REPLACEMENT TOTAL KNEE Right 2011  . SKIN CANCER EXCISION  2017  . TONSILLECTOMY AND ADENOIDECTOMY  1950  . TOTAL KNEE ARTHROPLASTY Left 12/10/2019   Procedure: TOTAL KNEE ARTHROPLASTY;  Surgeon: Vickey Huger, MD;  Location: [facility];  Service: Orthopedics;  Laterality: Left;  75 mins needed for length of case    OB History   No obstetric history on file.      Home Medications    Prior to Admission medications   Medication Sig Start Date End Date Taking? Authorizing Provider  Calcium Carbonate-Vitamin D (CALTRATE 600+D PO) Take 2 tablets by mouth daily.     [provider]  cetirizine (ZYRTEC) 10 MG tablet Take 0.5 tablets (5 mg total) by mouth daily. 11/14/20   Loura Halt A, NP  Cyanocobalamin (VITAMIN B-12 PO) Take 2,500 mcg by mouth daily.     [provider]  doxycycline (VIBRAMYCIN) 100 MG capsule Take 1 capsule (100 mg total) by mouth 2 (two) times daily for 7 days. 11/14/20 11/21/20  Loura Halt A, NP  fluticasone (FLONASE) 50 MCG/ACT nasal spray Place 2 sprays into both nostrils daily. 02/20/19   Leone Haven, MD  levothyroxine (SYNTHROID) 112 MCG tablet TAKE 1 TABLET DAILY BEFORE BREAKFAST 09/29/20   Leone Haven, MD  Misc Natural  Products (OSTEO BI-FLEX JOINT SHIELD PO) Take 1 tablet by mouth 2 (two) times daily.     [provider]  Multiple Vitamins-Minerals (CENTRUM SILVER PO) Take 1 tablet by mouth daily.     [provider]  Multiple Vitamins-Minerals (PRESERVISION AREDS 2+MULTI VIT PO) Take 1 capsule by mouth 2 (two) times daily.    [provider]  sodium chloride (OCEAN) 0.65 % SOLN nasal spray Place 1 spray into both nostrils as needed for congestion. 11/14/20   Orvan July, NP  traZODone (DESYREL) 50 MG tablet TAKE 1 TABLET AT BEDTIME ASNEEDED FOR SLEEP 09/02/20   Leone Haven, MD    Family History Family History  Problem Relation Age of Onset  . Hypertension Mother   .  Hypertension Sister   . Mental illness Sister   . Dementia Sister   . Diabetes Brother   . Cancer Brother        Kidney/Bladder  . Dementia Father   . Parkinson's disease Brother   . Breast cancer Neg Hx     Social History Social History   Tobacco Use  . Smoking status: Never Smoker  . Smokeless tobacco: Never Used  Vaping Use  . Vaping Use: Never used  Substance Use Topics  . Alcohol use: No  . Drug use: No     Allergies   Amoxicillin-pot clavulanate, Etodolac, Morphine and related, Oxycontin [oxycodone hcl], Cephalexin, Metronidazole, and Sulfa antibiotics   Review of Systems Review of Systems   Physical Exam Triage Vital Signs ED Triage Vitals  Enc Vitals Group     BP 11/14/20 0911 135/78     Pulse Rate 11/14/20 0911 69     Resp 11/14/20 0911 16     Temp 11/14/20 0911 98.8 F (37.1 C)     Temp Source 11/14/20 0911 Oral     SpO2 11/14/20 0911 95 %     Weight 11/14/20 0913 141 lb 15.6 oz (64.4 kg)     Height 11/14/20 0913 5\' 7"  (1.702 m)     Head Circumference --      Peak Flow --      Pain Score 11/14/20 0913 0     Pain Loc --      Pain Edu? --      Excl. in [place]? --    No data found.  Updated Vital Signs BP 135/78   Pulse 69   Temp 98.8 F (37.1 C) (Oral)   Resp 16   Ht 5\' 7"  (1.702 m)   Wt 141 lb 15.6 oz (64.4 kg)   SpO2 95%   BMI 22.24 kg/m   Visual Acuity Right Eye Distance:   Left Eye Distance:   Bilateral Distance:    Right Eye Near:   Left Eye Near:    Bilateral Near:     Physical Exam Vitals and nursing note reviewed.  Constitutional:      General: She is not in acute distress.    Appearance: Normal appearance. She is not ill-appearing, toxic-appearing or diaphoretic.  HENT:     Head: Normocephalic.     Right Ear: Tympanic membrane and ear canal normal.     Left Ear: Tympanic membrane and ear canal normal.     Nose: Congestion present.     Comments: Mucous and dried blood around left nares Maxillary sinus tenderness     Mouth/Throat:     Pharynx: Oropharynx is clear.  Eyes:     Conjunctiva/sclera: Conjunctivae normal.  Pulmonary:  Effort: Pulmonary effort is normal.  Musculoskeletal:        General: Normal range of motion.     Cervical back: Normal range of motion.  Skin:    General: Skin is warm and dry.     Findings: No rash.  Neurological:     Mental Status: She is alert.  Psychiatric:        Mood and Affect: Mood normal.      UC Treatments / Results  Labs (all labs ordered are listed, but only abnormal results are displayed) Labs Reviewed - No data to display  EKG   Radiology No results found.  Procedures Procedures (including critical care time)  Medications Ordered in UC Medications - No data to display  Initial Impression / Assessment and Plan / UC Course  I have reviewed the triage vital signs and the nursing notes.  Pertinent labs & imaging results that were available during my care of the patient were reviewed by me and considered in my medical decision making (see chart for details).     Sinusitis.  Concern for bacterial sinusitis at this time based on worsening symptoms and exam. We will go and cover with antibiotics at this time. Recommended Zyrtec and saline nasal spray for congestion Ibuprofen or over-the-counter medications as needed Follow up as needed for continued or worsening symptoms  Final Clinical Impressions(s) / UC Diagnoses   Final diagnoses:  Acute non-recurrent maxillary sinusitis     Discharge Instructions     To you for a sinus infection.  Take the medication as prescribed.  Nasal saline spray as needed for congestion.  Zyrtec daily.  Antibiotics as prescribed. Follow up as needed for continued or worsening symptoms     ED Prescriptions    Medication Sig Dispense Auth. Provider   cetirizine (ZYRTEC) 10 MG tablet Take 0.5 tablets (5 mg total) by mouth daily. 30 tablet Wynter Isaacs A, NP   doxycycline (VIBRAMYCIN) 100 MG capsule Take 1  capsule (100 mg total) by mouth 2 (two) times daily for 7 days. 14 capsule Bisma Klett A, NP   sodium chloride (OCEAN) 0.65 % SOLN nasal spray Place 1 spray into both nostrils as needed for congestion. 30 mL Jabier Deese A, NP     PDMP not reviewed this encounter.   Orvan July, NP 11/14/20 [id number]

## 2020-11-14 NOTE — ED Triage Notes (Signed)
Pt reports doing yard work on Tuesday. sts since then she have been having some burning in nose, and when blowing her nose there is some blood. Also reports having low grade fever at night.

## 2020-11-14 NOTE — Discharge Instructions (Addendum)
To you for a sinus infection.  Take the medication as prescribed.  Nasal saline spray as needed for congestion.  Zyrtec daily.  Antibiotics as prescribed. Follow up as needed for continued or worsening symptoms

## 2020-11-27 ENCOUNTER — Ambulatory Visit: Payer: Medicare HMO

## 2020-12-02 ENCOUNTER — Ambulatory Visit (INDEPENDENT_AMBULATORY_CARE_PROVIDER_SITE_OTHER): Payer: Medicare HMO

## 2020-12-02 VITALS — Ht 67.0 in | Wt 141.0 lb

## 2020-12-02 DIAGNOSIS — Z0000 Encounter for general adult medical examination without abnormal findings: Secondary | ICD-10-CM | POA: Diagnosis not present

## 2020-12-02 NOTE — Progress Notes (Signed)
Subjective:   Lisa Petersen is a 84 y.o. female who presents for Medicare Annual (Subsequent) preventive examination.  Review of Systems    No ROS.  Medicare Wellness Virtual Visit.   Cardiac Risk Factors include: advanced age (>81men, >24 women)     Objective:    Today's Vitals   12/02/20 1036  Weight: 141 lb (64 kg)  Height: 5\' 7"  (1.702 m)   Body mass index is 22.08 kg/m.  Advanced Directives 12/02/2020 12/04/2019 11/27/2019 08/31/2018  Does Patient Have a Medical Advance Directive? Yes Yes Yes Yes  Type of Paramedic of [place];Living will;Out of facility DNR (pink MOST or yellow form) [place];Living will Living will;Healthcare Power of [place];Living will  Does patient want to make changes to medical advance directive? No - Patient declined No - Patient declined No - Patient declined No - Patient declined  Copy of [place] in Chart? Yes - validated most recent copy scanned in chart (See row information) Yes - validated most recent copy scanned in chart (See row information) Yes - validated most recent copy scanned in chart (See row information) Yes    Current Medications (verified) Outpatient Encounter Medications as of 12/02/2020  Medication Sig  . Calcium Carbonate-Vitamin D (CALTRATE 600+D PO) Take 2 tablets by mouth daily.   . cetirizine (ZYRTEC) 10 MG tablet Take 0.5 tablets (5 mg total) by mouth daily.  . Cyanocobalamin (VITAMIN B-12 PO) Take 2,500 mcg by mouth daily.   . fluticasone (FLONASE) 50 MCG/ACT nasal spray Place 2 sprays into both nostrils daily.  Marland Kitchen levothyroxine (SYNTHROID) 112 MCG tablet TAKE 1 TABLET DAILY BEFORE BREAKFAST  . Misc Natural Products (OSTEO BI-FLEX JOINT SHIELD PO) Take 1 tablet by mouth 2 (two) times daily.   . Multiple Vitamins-Minerals (CENTRUM SILVER PO) Take 1 tablet by mouth daily.   . Multiple Vitamins-Minerals (PRESERVISION AREDS  2+MULTI VIT PO) Take 1 capsule by mouth 2 (two) times daily.  . sodium chloride (OCEAN) 0.65 % SOLN nasal spray Place 1 spray into both nostrils as needed for congestion.  . traZODone (DESYREL) 50 MG tablet TAKE 1 TABLET AT BEDTIME ASNEEDED FOR SLEEP   No facility-administered encounter medications on file as of 12/02/2020.    Allergies (verified) Amoxicillin-pot clavulanate, Etodolac, Morphine and related, Oxycontin [oxycodone hcl], Cephalexin, Metronidazole, and Sulfa antibiotics   History: Past Medical History:  Diagnosis Date  . Allergy   . Anxiety   . Colon polyps   . Depression   . History of Clostridioides difficile colitis 2014  . History of endometriosis   . History of Graves' disease   . History of jaundice as a child    age 76  . Hyperlipidemia   . Hypothyroidism   . Insomnia   . OA (osteoarthritis)   . Osteopenia   . Plantar fasciitis   . Rheumatoid arthritis ([place])   . Sigmoid diverticulosis 2008  . Squamous cell carcinoma in situ 2017   Central chest  . UTI (lower urinary tract infection)   . Vitamin B 12 deficiency    Past Surgical History:  Procedure Laterality Date  . ABDOMINAL HYSTERECTOMY  1975   Bilateral oophorectomy  . CATARACT EXTRACTION Bilateral 2009  . COLECTOMY Right 2006  . COLONOSCOPY  2015  . KNEE ARTHROSCOPY  2011   after knee replacement  . REPLACEMENT TOTAL KNEE Right 2011  . SKIN CANCER EXCISION  2017  . TONSILLECTOMY AND ADENOIDECTOMY  1950  .  TOTAL KNEE ARTHROPLASTY Left 12/10/2019   Procedure: TOTAL KNEE ARTHROPLASTY;  Surgeon: Vickey Huger, MD;  Location: [facility];  Service: Orthopedics;  Laterality: Left;  75 mins needed for length of case   Family History  Problem Relation Age of Onset  . Hypertension Mother   . Hypertension Sister   . Mental illness Sister   . Dementia Sister   . Diabetes Brother   . Cancer Brother        Kidney/Bladder  . Dementia Father   . Parkinson's disease Brother   . Breast cancer Neg Hx     Social History   Socioeconomic History  . Marital status: Widowed    Spouse name: Not on file  . Number of children: Not on file  . Years of education: Not on file  . Highest education level: Not on file  Occupational History  . Not on file  Tobacco Use  . Smoking status: Never Smoker  . Smokeless tobacco: Never Used  Vaping Use  . Vaping Use: Never used  Substance and Sexual Activity  . Alcohol use: No  . Drug use: No  . Sexual activity: Yes    Birth control/protection: None  Other Topics Concern  . Not on file  Social History Narrative   Has a living will scanned into chart.   Social Determinants of Health   Financial Resource Strain: Low Risk   . Difficulty of Paying Living Expenses: Not hard at all  Food Insecurity: No Food Insecurity  . Worried About Charity fundraiser in the Last Year: Never true  . Ran Out of Food in the Last Year: Never true  Transportation Needs: No Transportation Needs  . Lack of Transportation (Medical): No  . Lack of Transportation (Non-Medical): No  Physical Activity: Not on file  Stress: No Stress Concern Present  . Feeling of Stress : Not at all  Social Connections: Unknown  . Frequency of Communication with Friends and Family: More than three times a week  . Frequency of Social Gatherings with Friends and Family: More than three times a week  . Attends Religious Services: Not on file  . Active Member of Clubs or Organizations: Not on file  . Attends Archivist Meetings: Not on file  . Marital Status: Not on file    Tobacco Counseling Counseling given: Not Answered   Clinical Intake:  Pre-visit preparation completed: Yes        Diabetes: No  How often do you need to have someone help you when you read instructions, pamphlets, or other written materials from your doctor or pharmacy?: 1 - Never  Interpreter Needed?: No      Activities of Daily Living In your present state of health, do you have any  difficulty performing the following activities: 12/02/2020 12/04/2019  Hearing? N N  Vision? N N  Difficulty concentrating or making decisions? N N  Walking or climbing stairs? N N  Dressing or bathing? N N  Doing errands, shopping? N N  Preparing Food and eating ? N -  Using the Toilet? N -  In the past six months, have you accidently leaked urine? N -  Do you have problems with loss of bowel control? N -  Managing your Medications? N -  Managing your Finances? N -  Housekeeping or managing your Housekeeping? N -  Some recent data might be hidden    Patient Care Team: Leone Haven, MD as PCP - General (Family Medicine) Leodis Binet  Lavone Neri, MD as Referring Physician (Surgery) Dasher, Rayvon Char, MD as Attending Physician (Dermatology) Birder Robson, MD as Referring Physician (Ophthalmology)  Indicate any recent Medical Services you may have received from other than [facility] providers in the past year (date may be approximate).     Assessment:   This is a routine wellness examination for [place].  I connected with Mirtha today by telephone and verified that I am speaking with the correct person using two identifiers. Location patient: home Location provider: work Persons participating in the virtual visit: patient, Marine scientist.    I discussed the limitations, risks, security and privacy concerns of performing an evaluation and management service by telephone and the availability of in person appointments. The patient expressed understanding and verbally consented to this telephonic visit.    Interactive audio and video telecommunications were attempted between this provider and patient, however failed, due to patient having technical difficulties OR patient did not have access to video capability.  We continued and completed visit with audio only.  Some vital signs may be absent or patient reported.   Hearing/Vision screen  Hearing Screening   125Hz  250Hz  500Hz  1000Hz  2000Hz   3000Hz  4000Hz  6000Hz  8000Hz   Right ear:           Left ear:           Comments: Patient is able to hear conversational tones without difficulty.  No issues reported.    Vision Screening Comments: Followed [facility]  Wears corrective lenses  Cataract extraction, bilateral  Visual acuity not assessed, virtual visit.    Dietary issues and exercise activities discussed: Current Exercise Habits: Home exercise routine, Intensity: Mild  Healthy diet Good water intake  Goals      Patient Stated   .  Follow up with Primary Care Provider (pt-stated)      Maintain Healthy Lifestyle      Depression Screen PHQ 2/9 Scores 12/02/2020 06/18/2020 11/27/2019 10/17/2019 07/16/2019 06/07/2019 08/31/2018  PHQ - 2 Score 0 0 0 0 0 0 0  PHQ- 9 Score - - - - - - -    Fall Risk Fall Risk  12/02/2020 06/18/2020 11/27/2019 10/17/2019 07/31/2019  Falls in the past year? 0 0 0 0 0  Number falls in past yr: 0 0 - 0 -  Injury with Fall? 0 - - - -  Follow up Falls evaluation completed Falls evaluation completed Falls prevention discussed Falls evaluation completed Falls evaluation completed    [place]: Handrails in use when climbing stairs? Yes Home free of loose throw rugs in walkways, pet beds, electrical cords, etc? Yes  Adequate lighting in your home to reduce risk of falls? Yes   ASSISTIVE DEVICES UTILIZED TO PREVENT FALLS: Use of a cane, walker or w/c? No   TIMED UP AND GO: Was the test performed? No . Virtual visit.   Cognitive Function: Patient is alert and oriented x3  Denies difficulty focusing, making decisions, memory loss.  Enjoys reading.  MMSE/6CIT deferred per patient preference. Normal by direct communication/observation.   MMSE - Mini Mental State Exam 08/31/2018  Orientation to time 5  Orientation to Place 5  Registration 3  Attention/ Calculation 5  Recall 3  Language- name 2 objects 2  Language- repeat 1  Language- follow 3  step command 3  Language- read & follow direction 1  Write a sentence 1  Copy design 1  Total score 30     6CIT Screen 11/27/2019  What Year? 0 points  What month? 0 points  What time? 0 points  Count back from 20 0 points  Months in reverse 0 points  Repeat phrase 0 points  Total Score 0    Immunizations Immunization History  Administered Date(s) Administered  . Influenza, High Dose Seasonal PF 08/31/2018, 08/20/2019  . Influenza-Unspecified 08/06/2014, 08/25/2015, 09/02/2016, 08/27/2020  . PFIZER SARS-COV-2 Vaccination 12/27/2019, 01/17/2020, 08/21/2020  . Pneumococcal Conjugate-13 06/17/2015  . Pneumococcal Polysaccharide-23 05/29/2009  . Td 12/05/2006  . Zoster 12/21/2007  . Zoster Recombinat (Shingrix) 09/28/2018, 12/02/2018    TDAP status: Due, Education has been provided regarding the importance of this vaccine. Advised may receive this vaccine at local pharmacy or Health Dept. Aware to provide a copy of the vaccination record if obtained from local pharmacy or Health Dept. Verbalized acceptance and understanding. Deferred.   Health Maintenance Health Maintenance  Topic Date Due  . TETANUS/TDAP  12/02/2021 (Originally 12/05/2016)  . COVID-19 Vaccine (4 - Booster for Pfizer series) 02/18/2021  . INFLUENZA VACCINE  Completed  . DEXA SCAN  Completed  . PNA vac Low Risk Adult  Completed   Colorectal cancer screening: No longer required.   Mammogram status: Completed 03/04/20. Repeat every year. MM 3D SCREEN BREAST BILATERAL   Bone Density status: Completed 07/16/20. Results reflect: Bone density results: OSTEOPENIA. Repeat every 2 years. Calcium Carbonate-Vitamin D (CALTRATE 600+D PO)  Lung Cancer Screening: (Low Dose CT Chest recommended if Age 39-80 years, 30 pack-year currently smoking OR have quit w/in 15years.) does not qualify.   Hepatitis C Screening: does not qualify.  Vision Screening: Recommended annual ophthalmology exams for early detection of glaucoma  and other disorders of the eye. Is the patient up to date with their annual eye exam?  Yes   Dental Screening: Recommended annual dental exams for proper oral hygiene  Community Resource Referral / Chronic Care Management: CRR required this visit?  No   CCM required this visit?  No      Plan:   Keep all routine maintenance appointments.   Cpe 06/29/21 @ 8:00; fasting  I have personally reviewed and noted the following in the patient's chart:   . Medical and social history . Use of alcohol, tobacco or illicit drugs  . Current medications and supplements . Functional ability and status . Nutritional status . Physical activity . Advanced directives . List of other physicians . Hospitalizations, surgeries, and ER visits in previous 12 months . Vitals . Screenings to include cognitive, depression, and falls . Referrals and appointments  In addition, I have reviewed and discussed with patient certain preventive protocols, quality metrics, and best practice recommendations. A written personalized care plan for preventive services as well as general preventive health recommendations were provided to patient via mychart.     Ashok Pall, LPN   [id number]/05/[id number]

## 2020-12-02 NOTE — Patient Instructions (Addendum)
Lisa Petersen , Thank you for taking time to come for your Medicare Wellness Visit. I appreciate your ongoing commitment to your health goals. Please review the following plan we discussed and let me know if I can assist you in the future.   These are the goals we discussed: Goals      Patient Stated     Follow up with Primary Care Provider (pt-stated)      Maintain Healthy Lifestyle       This is a list of the screening recommended for you and due dates:  Health Maintenance  Topic Date Due   Tetanus Vaccine  12/02/2021*   COVID-19 Vaccine (4 - Booster for Pfizer series) 02/18/2021   Flu Shot  Completed   DEXA scan (bone density measurement)  Completed   Pneumonia vaccines  Completed  *Topic was postponed. The date shown is not the original due date.    Immunizations Immunization History  Administered Date(s) Administered   Influenza, High Dose Seasonal PF 08/31/2018, 08/20/2019   Influenza-Unspecified 08/06/2014, 08/25/2015, 09/02/2016, 08/27/2020   PFIZER SARS-COV-2 Vaccination 12/27/2019, 01/17/2020   Pneumococcal Conjugate-13 06/17/2015   Pneumococcal Polysaccharide-23 05/29/2009   Td 12/05/2006   Zoster 12/21/2007   Zoster Recombinat (Shingrix) 09/28/2018, 12/02/2018   Keep all routine maintenance appointments.   Cpe 06/29/21 @ 8:00; fasting  Advanced directives: on file  Conditions/risks identified: none new  Follow up in one year for your annual wellness visit    Preventive Care 65 Years and Older, Female Preventive care refers to lifestyle choices and visits with your health care provider that can promote health and wellness. What does preventive care include?  A yearly physical exam. This is also called an annual well check.  Dental exams once or twice a year.  Routine eye exams. Ask your health care provider how often you should have your eyes checked.  Personal lifestyle choices, including:  Daily care of your teeth and gums.  Regular  physical activity.  Eating a healthy diet.  Avoiding tobacco and drug use.  Limiting alcohol use.  Practicing safe sex.  Taking low-dose aspirin every day.  Taking vitamin and mineral supplements as recommended by your health care provider. What happens during an annual well check? The services and screenings done by your health care provider during your annual well check will depend on your age, overall health, lifestyle risk factors, and family history of disease. Counseling  Your health care provider may ask you questions about your:  Alcohol use.  Tobacco use.  Drug use.  Emotional well-being.  Home and relationship well-being.  Sexual activity.  Eating habits.  History of falls.  Memory and ability to understand (cognition).  Work and work Astronomer.  Reproductive health. Screening  You may have the following tests or measurements:  Height, weight, and BMI.  Blood pressure.  Lipid and cholesterol levels. These may be checked every 5 years, or more frequently if you are over 73 years old.  Skin check.  Lung cancer screening. You may have this screening every year starting at age 26 if you have a 30-pack-year history of smoking and currently smoke or have quit within the past 15 years.  Fecal occult blood test (FOBT) of the stool. You may have this test every year starting at age 40.  Flexible sigmoidoscopy or colonoscopy. You may have a sigmoidoscopy every 5 years or a colonoscopy every 10 years starting at age 2.  Hepatitis C blood test.  Hepatitis B blood test.  Sexually transmitted  disease (STD) testing.  Diabetes screening. This is done by checking your blood sugar (glucose) after you have not eaten for a while (fasting). You may have this done every 1-3 years.  Bone density scan. This is done to screen for osteoporosis. You may have this done starting at age 55.  Mammogram. This may be done every 1-2 years. Talk to your health care  provider about how often you should have regular mammograms. Talk with your health care provider about your test results, treatment options, and if necessary, the need for more tests. Vaccines  Your health care provider may recommend certain vaccines, such as:  Influenza vaccine. This is recommended every year.  Tetanus, diphtheria, and acellular pertussis (Tdap, Td) vaccine. You may need a Td booster every 10 years.  Zoster vaccine. You may need this after age 21.  Pneumococcal 13-valent conjugate (PCV13) vaccine. One dose is recommended after age 2.  Pneumococcal polysaccharide (PPSV23) vaccine. One dose is recommended after age 44. Talk to your health care provider about which screenings and vaccines you need and how often you need them. This information is not intended to replace advice given to you by your health care provider. Make sure you discuss any questions you have with your health care provider. Document Released: 12/19/2015 Document Revised: 08/11/2016 Document Reviewed: 09/23/2015 Elsevier Interactive Patient Education  2017 [employer] Prevention in the Home Falls can cause injuries. They can happen to people of all ages. There are many things you can do to make your home safe and to help prevent falls. What can I do on the outside of my home?  Regularly fix the edges of walkways and driveways and fix any cracks.  Remove anything that might make you trip as you walk through a door, such as a raised step or threshold.  Trim any bushes or trees on the path to your home.  Use bright outdoor lighting.  Clear any walking paths of anything that might make someone trip, such as rocks or tools.  Regularly check to see if handrails are loose or broken. Make sure that both sides of any steps have handrails.  Any raised decks and porches should have guardrails on the edges.  Have any leaves, snow, or ice cleared regularly.  Use sand or salt on walking paths  during winter.  Clean up any spills in your garage right away. This includes oil or grease spills. What can I do in the bathroom?  Use night lights.  Install grab bars by the toilet and in the tub and shower. Do not use towel bars as grab bars.  Use non-skid mats or decals in the tub or shower.  If you need to sit down in the shower, use a plastic, non-slip stool.  Keep the floor dry. Clean up any water that spills on the floor as soon as it happens.  Remove soap buildup in the tub or shower regularly.  Attach bath mats securely with double-sided non-slip rug tape.  Do not have throw rugs and other things on the floor that can make you trip. What can I do in the bedroom?  Use night lights.  Make sure that you have a light by your bed that is easy to reach.  Do not use any sheets or blankets that are too big for your bed. They should not hang down onto the floor.  Have a firm chair that has side arms. You can use this for support while you get dressed.  Do not have throw rugs and other things on the floor that can make you trip. What can I do in the kitchen?  Clean up any spills right away.  Avoid walking on wet floors.  Keep items that you use a lot in easy-to-reach places.  If you need to reach something above you, use a strong step stool that has a grab bar.  Keep electrical cords out of the way.  Do not use floor polish or wax that makes floors slippery. If you must use wax, use non-skid floor wax.  Do not have throw rugs and other things on the floor that can make you trip. What can I do with my stairs?  Do not leave any items on the stairs.  Make sure that there are handrails on both sides of the stairs and use them. Fix handrails that are broken or loose. Make sure that handrails are as long as the stairways.  Check any carpeting to make sure that it is firmly attached to the stairs. Fix any carpet that is loose or worn.  Avoid having throw rugs at the top  or bottom of the stairs. If you do have throw rugs, attach them to the floor with carpet tape.  Make sure that you have a light switch at the top of the stairs and the bottom of the stairs. If you do not have them, ask someone to add them for you. What else can I do to help prevent falls?  Wear shoes that:  Do not have high heels.  Have rubber bottoms.  Are comfortable and fit you well.  Are closed at the toe. Do not wear sandals.  If you use a stepladder:  Make sure that it is fully opened. Do not climb a closed stepladder.  Make sure that both sides of the stepladder are locked into place.  Ask someone to hold it for you, if possible.  Clearly mark and make sure that you can see:  Any grab bars or handrails.  First and last steps.  Where the edge of each step is.  Use tools that help you move around (mobility aids) if they are needed. These include:  Canes.  Walkers.  Scooters.  Crutches.  Turn on the lights when you go into a dark area. Replace any light bulbs as soon as they burn out.  Set up your furniture so you have a clear path. Avoid moving your furniture around.  If any of your floors are uneven, fix them.  If there are any pets around you, be aware of where they are.  Review your medicines with your doctor. Some medicines can make you feel dizzy. This can increase your chance of falling. Ask your doctor what other things that you can do to help prevent falls. This information is not intended to replace advice given to you by your health care provider. Make sure you discuss any questions you have with your health care provider. Document Released: 09/18/2009 Document Revised: 04/29/2016 Document Reviewed: 12/27/2014 Elsevier Interactive Patient Education  2017 [employer].

## 2020-12-25 DIAGNOSIS — Z96652 Presence of left artificial knee joint: Secondary | ICD-10-CM | POA: Diagnosis not present

## 2021-01-12 ENCOUNTER — Telehealth (INDEPENDENT_AMBULATORY_CARE_PROVIDER_SITE_OTHER): Payer: Medicare HMO | Admitting: Family Medicine

## 2021-01-12 ENCOUNTER — Other Ambulatory Visit: Payer: Medicare HMO

## 2021-01-12 ENCOUNTER — Encounter: Payer: Self-pay | Admitting: Family Medicine

## 2021-01-12 DIAGNOSIS — J989 Respiratory disorder, unspecified: Secondary | ICD-10-CM

## 2021-01-12 HISTORY — DX: Respiratory disorder, unspecified: J98.9

## 2021-01-12 MED ORDER — TRAZODONE HCL 50 MG PO TABS: ORAL_TABLET | ORAL | 1 refills | Status: DC

## 2021-01-12 MED ORDER — AZITHROMYCIN 250 MG PO TABS: ORAL_TABLET | ORAL | 0 refills | Status: AC

## 2021-01-12 NOTE — Progress Notes (Signed)
Virtual Visit via telephone Note  This visit type was conducted due to national recommendations for restrictions regarding the COVID-19 pandemic (e.g. social distancing).  This format is felt to be most appropriate for this patient at this time.  All issues noted in this document were discussed and addressed.  No physical exam was performed (except for noted visual exam findings with Video Visits).   I connected with Lisa Petersen today at  2:15 PM EST by telephone and verified that I am speaking with the correct person using two identifiers. Location patient: home Location provider: work  Persons participating in the virtual visit: patient, provider  I discussed the limitations, risks, security and privacy concerns of performing an evaluation and management service by telephone and the availability of in person appointments. I also discussed with the patient that there may be a patient responsible charge related to this service. The patient expressed understanding and agreed to proceed.  Interactive audio and video telecommunications were attempted between this provider and patient, however failed, due to patient having technical difficulties OR patient did not have access to video capability.  We continued and completed visit with audio only.   Reason for visit: same day visit  HPI: Respiratory illness: Patient notes a month or so ago she started with runny nose and sinus congestion.  She was seen at a walk-in clinic and given an antibiotic and notes she improved at that time and her symptoms resolved for a day or 2.  She notes her symptoms returned 7 days ago with cough and postnasal drip.  She does not feel like she is congested in her sinuses or chest.  She had one episode of shortness of breath when she was rushing in and out of the store last week.  She had a T-max of 100 F last week.  She does note some headaches though they are not severe.  No taste or smell disturbances.  She does not  have much of an appetite.  No COVID-19 exposures.  She has been vaccinated and received her booster vaccine against COVID-19.  Mucinex was not beneficial.  The patient does not remember the name of the antibiotic that she took in January.   ROS: See pertinent positives and negatives per HPI.  Past Medical History:  Diagnosis Date  . Allergy   . Anxiety   . Colon polyps   . Depression   . History of Clostridioides difficile colitis 2014  . History of endometriosis   . History of Graves' disease   . History of jaundice as a child    age 85  . Hyperlipidemia   . Hypothyroidism   . Insomnia   . OA (osteoarthritis)   . Osteopenia   . Plantar fasciitis   . Rheumatoid arthritis ([place])   . Sigmoid diverticulosis 2008  . Squamous cell carcinoma in situ 2017   Central chest  . UTI (lower urinary tract infection)   . Vitamin B 12 deficiency     Past Surgical History:  Procedure Laterality Date  . ABDOMINAL HYSTERECTOMY  1975   Bilateral oophorectomy  . CATARACT EXTRACTION Bilateral 2009  . COLECTOMY Right 2006  . COLONOSCOPY  2015  . KNEE ARTHROSCOPY  2011   after knee replacement  . REPLACEMENT TOTAL KNEE Right 2011  . SKIN CANCER EXCISION  2017  . TONSILLECTOMY AND ADENOIDECTOMY  1950  . TOTAL KNEE ARTHROPLASTY Left 12/10/2019   Procedure: TOTAL KNEE ARTHROPLASTY;  Surgeon: Vickey Huger, MD;  Location: [facility];  Service: Orthopedics;  Laterality: Left;  75 mins needed for length of case    Family History  Problem Relation Age of Onset  . Hypertension Mother   . Hypertension Sister   . Mental illness Sister   . Dementia Sister   . Diabetes Brother   . Cancer Brother        Kidney/Bladder  . Dementia Father   . Parkinson's disease Brother   . Breast cancer Neg Hx     SOCIAL HX: Non-smoker   Current Outpatient Medications:  .  azithromycin (ZITHROMAX) 250 MG tablet, Take 2 tablets (500 mg total) by mouth daily for 1 day, THEN 1 tablet (250 mg total) daily for 4 days.,  Disp: 6 tablet, Rfl: 0 .  Calcium Carbonate-Vitamin D (CALTRATE 600+D PO), Take 2 tablets by mouth daily. , Disp: , Rfl:  .  cetirizine (ZYRTEC) 10 MG tablet, Take 0.5 tablets (5 mg total) by mouth daily., Disp: 30 tablet, Rfl: 0 .  Cyanocobalamin (VITAMIN B-12 PO), Take 2,500 mcg by mouth daily. , Disp: , Rfl:  .  fluticasone (FLONASE) 50 MCG/ACT nasal spray, Place 2 sprays into both nostrils daily., Disp: 16 g, Rfl: 6 .  levothyroxine (SYNTHROID) 112 MCG tablet, TAKE 1 TABLET DAILY BEFORE BREAKFAST, Disp: 90 tablet, Rfl: 2 .  Misc Natural Products (OSTEO BI-FLEX JOINT SHIELD PO), Take 1 tablet by mouth 2 (two) times daily. , Disp: , Rfl:  .  Multiple Vitamins-Minerals (CENTRUM SILVER PO), Take 1 tablet by mouth daily. , Disp: , Rfl:  .  Multiple Vitamins-Minerals (PRESERVISION AREDS 2+MULTI VIT PO), Take 1 capsule by mouth 2 (two) times daily., Disp: , Rfl:  .  sodium chloride (OCEAN) 0.65 % SOLN nasal spray, Place 1 spray into both nostrils as needed for congestion., Disp: 30 mL, Rfl: 0 .  traZODone (DESYREL) 50 MG tablet, TAKE 1 TABLET AT BEDTIME ASNEEDED FOR SLEEP, Disp: 90 tablet, Rfl: 1  EXAM: This was a telephone visit and thus no physical exam was completed.  ASSESSMENT AND PLAN:  Discussed the following assessment and plan:  Problem List Items Addressed This Visit    Respiratory illness    I discussed that these could be continuing symptoms from the illness she had in January or they could represent a new illness.  Discussed proceeding with treatment with azithromycin.  Advised that we would get her tested for COVID-19 and the CMA would contact her to get this scheduled.  Discussed quarantining at home at least until we get the test result back for her COVID-19 test.  Advised to seek medical attention if she developed shortness of breath or elevated fevers.          I discussed the assessment and treatment plan with the patient. The patient was provided an opportunity to ask  questions and all were answered. The patient agreed with the plan and demonstrated an understanding of the instructions.   The patient was advised to call back or seek an in-person evaluation if the symptoms worsen or if the condition fails to improve as anticipated.  I provided 11 minutes of non-face-to-face time during this encounter.   Tommi Rumps, MD

## 2021-01-12 NOTE — Addendum Note (Signed)
Addended by: Fulton Mole D on: 01/12/2021 02:50 PM   Modules accepted: Orders

## 2021-01-12 NOTE — Assessment & Plan Note (Signed)
I discussed that these could be continuing symptoms from the illness she had in January or they could represent a new illness.  Discussed proceeding with treatment with azithromycin.  Advised that we would get her tested for COVID-19 and the CMA would contact her to get this scheduled.  Discussed quarantining at home at least until we get the test result back for her COVID-19 test.  Advised to seek medical attention if she developed shortness of breath or elevated fevers.

## 2021-01-14 LAB — SARS-COV-2, NAA 2 DAY TAT

## 2021-01-14 LAB — NOVEL CORONAVIRUS, NAA: SARS-CoV-2, NAA: DETECTED — AB

## 2021-01-15 ENCOUNTER — Telehealth: Payer: Self-pay | Admitting: *Deleted

## 2021-01-15 NOTE — Telephone Encounter (Signed)
Called to discuss with patient about COVID-19 symptoms and the use of one of the available treatments for those with mild to moderate Covid symptoms and at a high risk of hospitalization.  Pt appears to qualify for outpatient treatment due to co-morbid conditions and/or a member of an at-risk group in accordance with the FDA Emergency Use Authorization.    Per chart note from 01/12/21 visit, the patient's symptoms began 7 days prior to 2/7. No call made, she no longer qualifies.   Symptom onset : 9 days ago Vaccinated: yes Booster? yes Immunocompromised?  Qualifiers:    Lisa Petersen

## 2021-02-03 ENCOUNTER — Other Ambulatory Visit: Payer: Self-pay | Admitting: Family Medicine

## 2021-02-03 DIAGNOSIS — Z1231 Encounter for screening mammogram for malignant neoplasm of breast: Secondary | ICD-10-CM

## 2021-02-25 DIAGNOSIS — R32 Unspecified urinary incontinence: Secondary | ICD-10-CM | POA: Diagnosis not present

## 2021-02-25 DIAGNOSIS — R030 Elevated blood-pressure reading, without diagnosis of hypertension: Secondary | ICD-10-CM | POA: Diagnosis not present

## 2021-02-25 DIAGNOSIS — G4700 Insomnia, unspecified: Secondary | ICD-10-CM | POA: Diagnosis not present

## 2021-02-25 DIAGNOSIS — M8580 Other specified disorders of bone density and structure, unspecified site: Secondary | ICD-10-CM | POA: Diagnosis not present

## 2021-02-25 DIAGNOSIS — I951 Orthostatic hypotension: Secondary | ICD-10-CM | POA: Diagnosis not present

## 2021-02-25 DIAGNOSIS — Z809 Family history of malignant neoplasm, unspecified: Secondary | ICD-10-CM | POA: Diagnosis not present

## 2021-02-25 DIAGNOSIS — M1990 Unspecified osteoarthritis, unspecified site: Secondary | ICD-10-CM | POA: Diagnosis not present

## 2021-02-25 DIAGNOSIS — E039 Hypothyroidism, unspecified: Secondary | ICD-10-CM | POA: Diagnosis not present

## 2021-02-25 DIAGNOSIS — Z882 Allergy status to sulfonamides status: Secondary | ICD-10-CM | POA: Diagnosis not present

## 2021-03-05 ENCOUNTER — Other Ambulatory Visit: Payer: Self-pay

## 2021-03-05 ENCOUNTER — Ambulatory Visit
Admission: RE | Admit: 2021-03-05 | Discharge: 2021-03-05 | Disposition: A | Discharge status: Home / Self Care | Payer: Medicare HMO | Source: Ambulatory Visit | Attending: Family Medicine | Admitting: Family Medicine

## 2021-03-05 DIAGNOSIS — Z1231 Encounter for screening mammogram for malignant neoplasm of breast: Secondary | ICD-10-CM | POA: Insufficient documentation

## 2021-03-18 IMAGING — MG DIGITAL DIAGNOSTIC UNILATERAL LEFT MAMMOGRAM WITH TOMO AND CAD
8 series · 8 of 24 positions shown · non-contrast
Comparison: 01/26/2019

CLINICAL DATA: Patient had a palpable mass in the LEFT axilla
approximately 1 week ago. The mass has resolved but the patient
continues to have tenderness and fullness in the LEFT axilla which
radiates into the LATERAL portion of the LEFT breast.

EXAM:
DIGITAL DIAGNOSTIC LEFT MAMMOGRAM WITH CAD AND TOMO
ULTRASOUND LEFT BREAST

[L CC synth-2D]
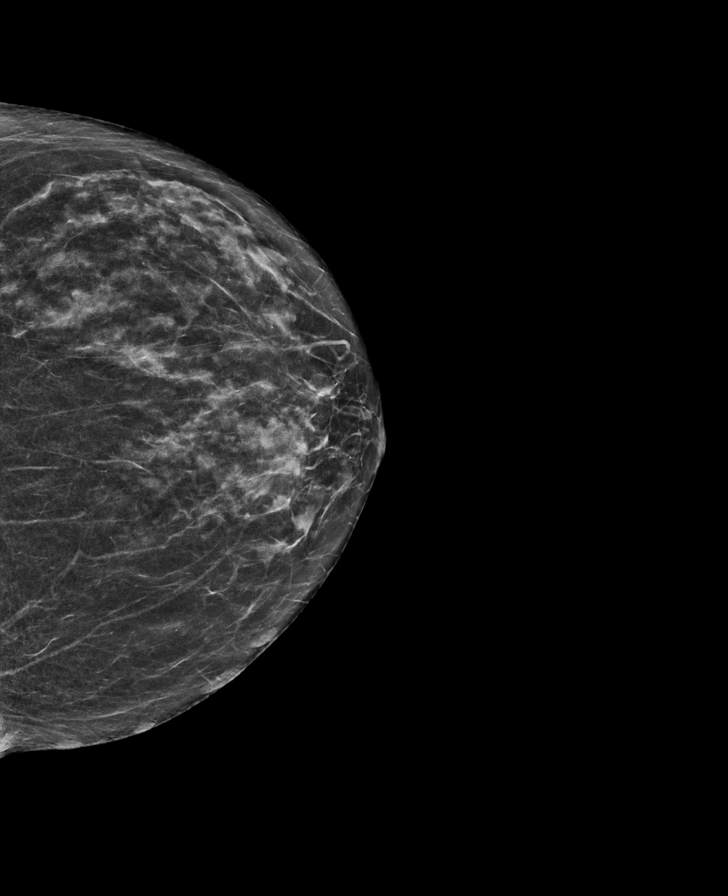

[L MLO synth-2D (1 of 2)]
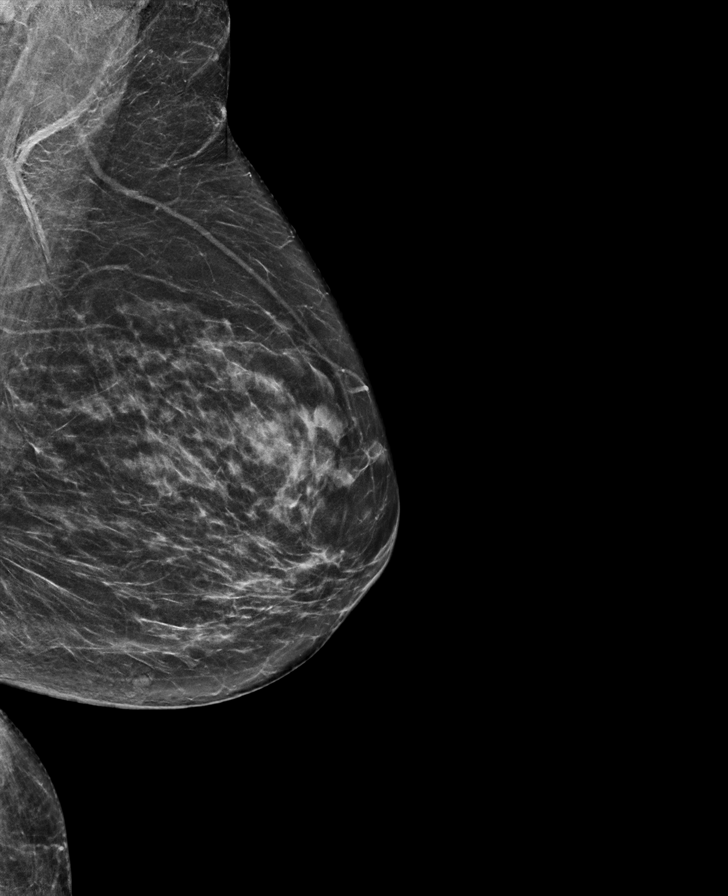

[L XCCL synth-2D]
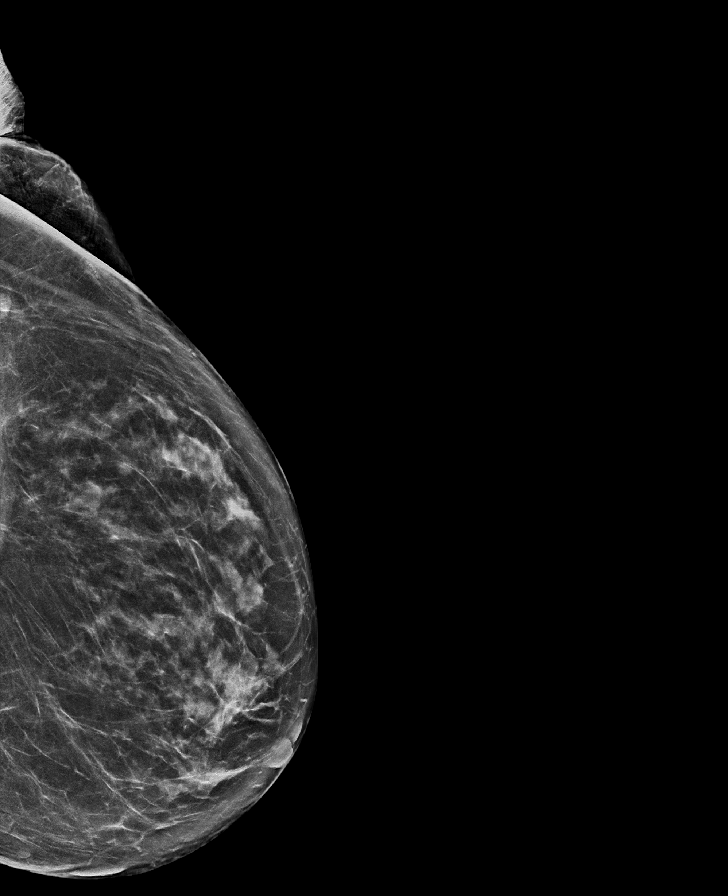

[L MLO synth-2D (2 of 2)]
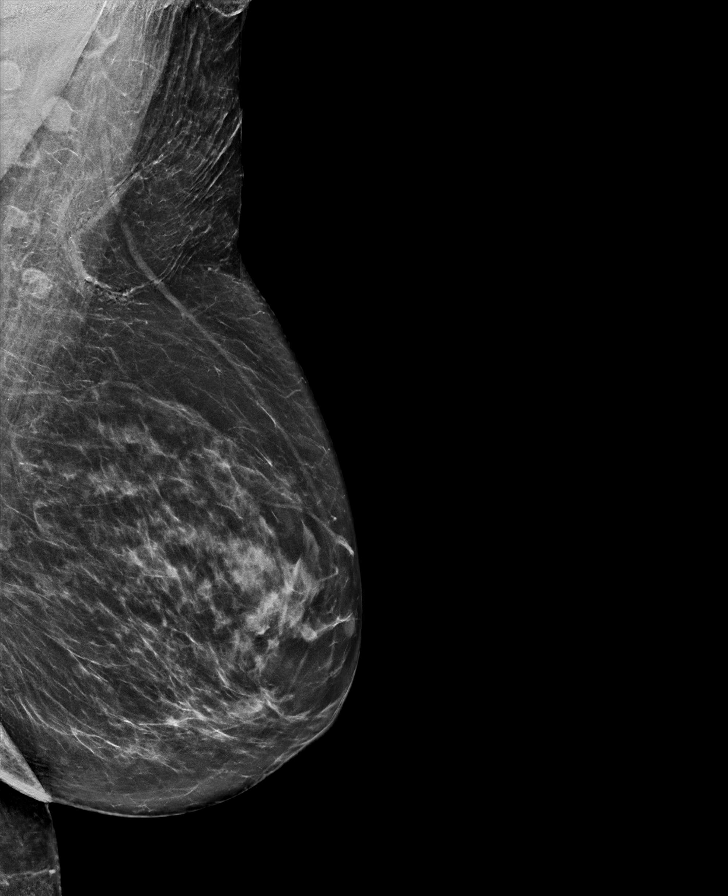

[L MLO tomo (1 of 2) · tomo slice 33/65.0]
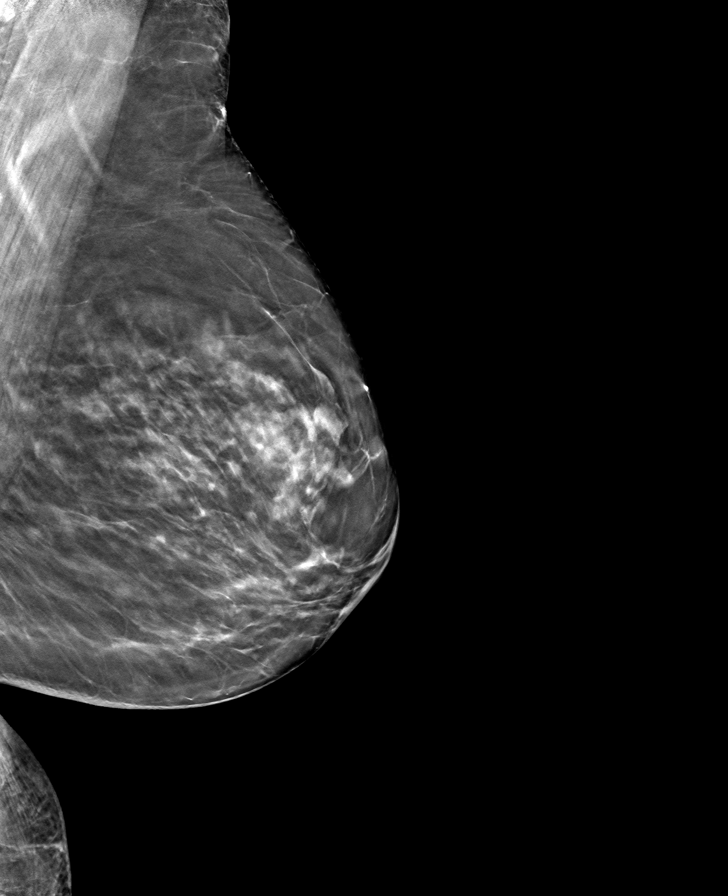

[L MLO tomo (2 of 2) · tomo slice 35/70.0]
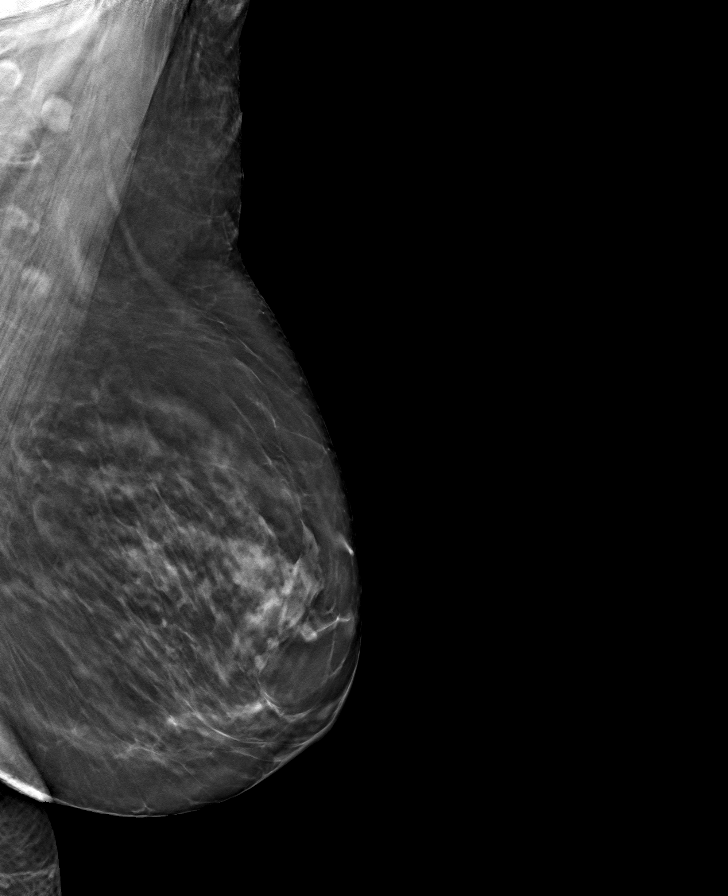

[L CC tomo · tomo slice 30/59.0]
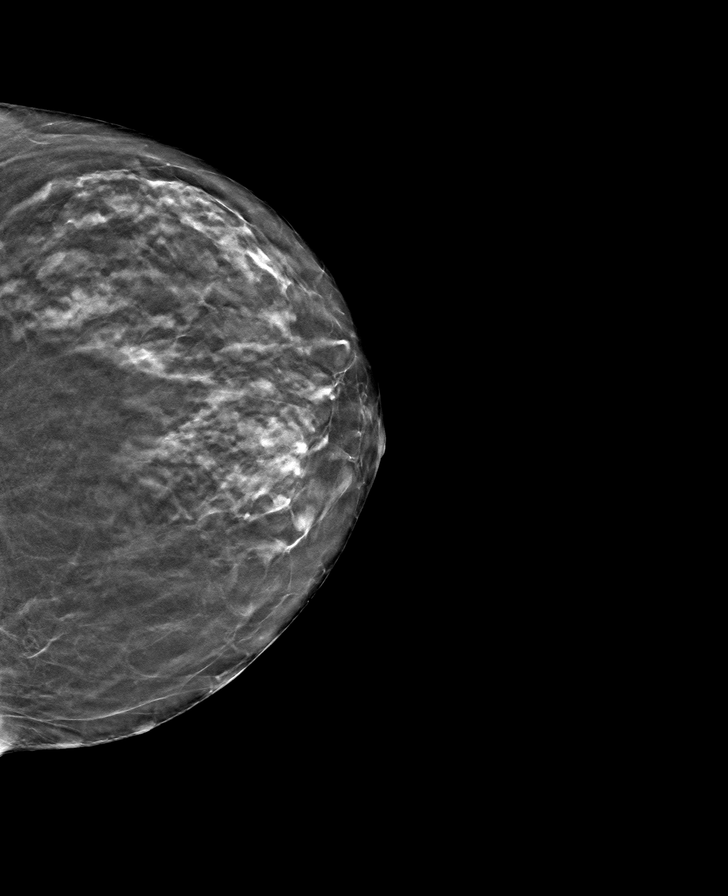

[L XCCL tomo · tomo slice 37/73.0]
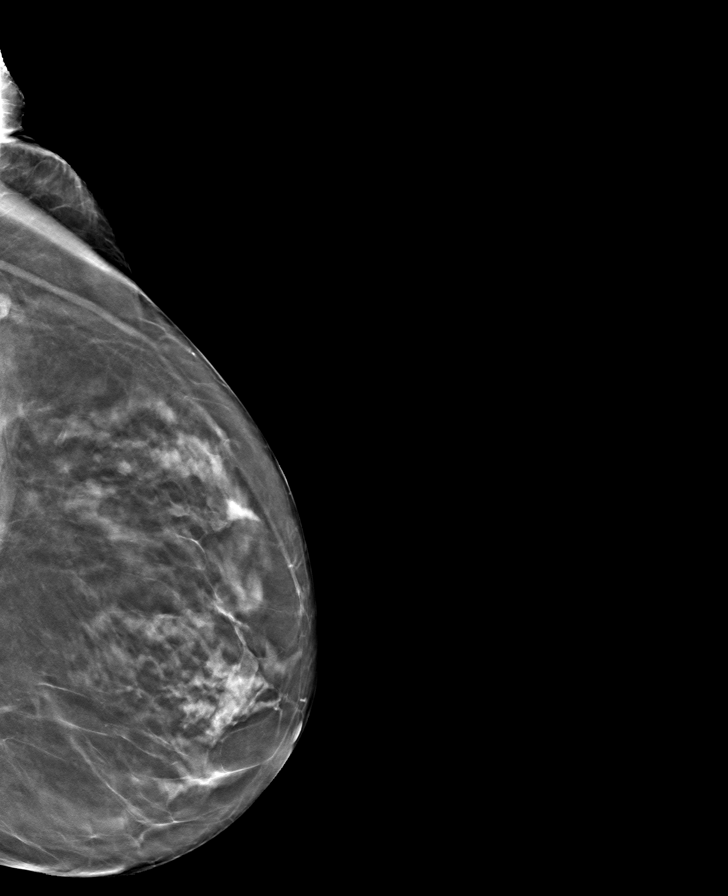

[8 of 24 positions shown; findings below may reference images not displayed]

ACR Breast Density Category c: The breast tissue is heterogeneously
dense, which may obscure small masses.
FINDINGS: No suspicious mass, distortion, or microcalcifications are
identified to suggest presence of malignancy. Visualized LEFT
axillary lymph nodes have normal morphology.

Mammographic images were processed with CAD.

On physical exam, I palpate no abnormality in the LOWER LEFT axilla
or UPPER-OUTER QUADRANT of the LEFT breast.

Targeted ultrasound is performed, showing normal appearing
fibroglandular tissue in the UPPER-OUTER QUADRANT of the LEFT
breast. LEFT axillary lymph nodes with normal morphology are imaged.
No adenopathy or other axillary mass.
IMPRESSION: No mammographic or ultrasound evidence for malignancy.

RECOMMENDATION:
Screening mammogram is recommended in January 2020.

I have discussed the findings and recommendations with the patient.
Results were also provided in writing at the conclusion of the
visit. If applicable, a reminder letter will be sent to the patient
regarding the next appointment.

BI-RADS CATEGORY  1: Negative.

## 2021-03-28 ENCOUNTER — Other Ambulatory Visit: Payer: Self-pay | Admitting: Family Medicine

## 2021-05-11 ENCOUNTER — Telehealth: Payer: Self-pay | Admitting: Family Medicine

## 2021-05-11 NOTE — Telephone Encounter (Signed)
PT called to advise that [facility] sent them back to [place] to be seen in the next 24 hours. Placed them on the schedule with Laverna Peace for tomorrow at 10:00. PT did inquire to see if they can have a test done instead for a urine sample instead of coming in.

## 2021-05-11 NOTE — Telephone Encounter (Signed)
Okay, thank you!

## 2021-05-11 NOTE — Telephone Encounter (Signed)
Please advise 

## 2021-05-11 NOTE — Telephone Encounter (Signed)
Patient wanted to come into office to drop off a urine sample, she thinks she has a UTI. No available appointments. Patient was transferred to [facility] at [facility].

## 2021-05-12 ENCOUNTER — Encounter: Payer: Self-pay | Admitting: Adult Health

## 2021-05-12 ENCOUNTER — Ambulatory Visit (INDEPENDENT_AMBULATORY_CARE_PROVIDER_SITE_OTHER): Payer: Medicare HMO | Admitting: Adult Health

## 2021-05-12 ENCOUNTER — Other Ambulatory Visit: Payer: Self-pay

## 2021-05-12 VITALS — BP 122/84 | HR 60 | Temp 98.5°F | Ht 67.01 in | Wt 143.8 lb

## 2021-05-12 DIAGNOSIS — N390 Urinary tract infection, site not specified: Secondary | ICD-10-CM

## 2021-05-12 DIAGNOSIS — R399 Unspecified symptoms and signs involving the genitourinary system: Secondary | ICD-10-CM | POA: Diagnosis not present

## 2021-05-12 LAB — URINALYSIS, ROUTINE W REFLEX MICROSCOPIC
Bilirubin Urine: NEGATIVE
Hgb urine dipstick: NEGATIVE
Ketones, ur: NEGATIVE
Leukocytes,Ua: NEGATIVE
Nitrite: NEGATIVE
Specific Gravity, Urine: 1.015 (ref 1.000–1.030)
Total Protein, Urine: NEGATIVE
Urine Glucose: NEGATIVE
Urobilinogen, UA: 0.2 (ref 0.0–1.0)
pH: 6 (ref 5.0–8.0)

## 2021-05-12 MED ORDER — CIPROFLOXACIN HCL 500 MG PO TABS: 500.0000 mg | ORAL_TABLET | Freq: Two times a day (BID) | ORAL | 0 refills | Status: AC

## 2021-05-12 NOTE — Patient Instructions (Addendum)
Return if symptoms worsen or fail to improve, for at any time for any worsening symptoms, Go to Emergency room/ urgent care if worse.   Ciprofloxacin tablets What is this medicine? CIPROFLOXACIN (sip roe FLOX a sin) is a quinolone antibiotic. It is used to treat certain kinds of bacterial infections. It will not work for colds, flu, or other viral infections. This medicine may be used for other purposes; ask your health care provider or pharmacist if you have questions. COMMON BRAND NAME(S): Cipro What should I tell my health care provider before I take this medicine? They need to know if you have any of these conditions:  bone problems  diabetes  heart disease  high blood pressure  history of irregular heartbeat  history of low levels of potassium in the blood  joint problems  kidney disease  liver disease  mental illness  myasthenia gravis  seizures  tendon problems  tingling of the fingers or toes, or other nerve disorder  an unusual or allergic reaction to ciprofloxacin, other antibiotics or medicines, foods, dyes, or preservatives  pregnant or trying to get pregnant  breast-feeding How should I use this medicine? Take this medicine by mouth with a full glass of water. Follow the directions on the prescription label. You can take it with or without food. If it upsets your stomach, take it with food. Take your medicine at regular intervals. Do not take your medicine more often than directed. Take all of your medicine as directed even if you think you are better. Do not skip doses or stop your medicine early. Avoid antacids, aluminum, calcium, iron, magnesium, and zinc products for 6 hours before and 2 hours after taking a dose of this medicine. A special MedGuide will be given to you by the pharmacist with each prescription and refill. Be sure to read this information carefully each time. Talk to your pediatrician regarding the use of this medicine in children.  Special care may be needed. Overdosage: If you think you have taken too much of this medicine contact a poison control center or emergency room at once. NOTE: This medicine is only for you. Do not share this medicine with others. What if I miss a dose? If you miss a dose, take it as soon as you can. If it is almost time for your next dose, take only that dose. Do not take double or extra doses. What may interact with this medicine? Do not take this medicine with any of the following medications:  cisapride  dronedarone  flibanserin  lomitapide  pimozide  thioridazine  tizanidine This medicine may also interact with the following medications:  antacids  birth control pills  caffeine  certain medicines for diabetes, like glipizide, glyburide, or insulin  certain medicines that treat or prevent blood clots like warfarin  clozapine  cyclosporine  didanosine buffered tablets or powder  dofetilide  duloxetine  lanthanum carbonate  lidocaine  methotrexate  multivitamins  NSAIDS, medicines for pain and inflammation, like ibuprofen or naproxen  olanzapine  omeprazole  other medicines that prolong the QT interval (cause an abnormal heart rhythm)  phenytoin  probenecid  ropinirole  sevelamer  sildenafil  sucralfate  theophylline  ziprasidone  zolpidem This list may not describe all possible interactions. Give your health care provider a list of all the medicines, herbs, non-prescription drugs, or dietary supplements you use. Also tell them if you smoke, drink alcohol, or use illegal drugs. Some items may interact with your medicine. What should I watch  for while using this medicine? Tell your doctor or health care provider if your symptoms do not start to get better or if they get worse. This medicine may cause serious skin reactions. They can happen weeks to months after starting the medicine. Contact your health care provider right away if you  notice fevers or flu-like symptoms with a rash. The rash may be red or purple and then turn into blisters or peeling of the skin. Or, you might notice a red rash with swelling of the face, lips or lymph nodes in your neck or under your arms. Do not treat diarrhea with over the counter products. Contact your doctor if you have diarrhea that lasts more than 2 days or if it is severe and watery. Check with your doctor or health care provider if you get an attack of severe diarrhea, nausea and vomiting, or if you sweat a lot. The loss of too much body fluid can make it dangerous for you to take this medicine. This medicine may increase blood sugar. Ask your health care provider if changes in diet or medicines are needed if you have diabetes. You may get drowsy or dizzy. Do not drive, use machinery, or do anything that needs mental alertness until you know how this medicine affects you. Do not sit or stand up quickly, especially if you are an older patient. This reduces the risk of dizzy or fainting spells. This medicine can make you more sensitive to the sun. Keep out of the sun. If you cannot avoid being in the sun, wear protective clothing and use sunscreen. Do not use sun lamps or tanning beds/booths. What side effects may I notice from receiving this medicine? Side effects that you should report to your doctor or health care professional as soon as possible:  allergic reactions like skin rash or hives, swelling of the face, lips, or tongue  anxious  bloody or watery diarrhea  confusion  depressed mood  fast, irregular heartbeat  fever  hallucination, loss of contact with reality  joint, muscle, or tendon pain or swelling  loss of memory  pain, tingling, numbness in the hands or feet  redness, blistering, peeling or loosening of the skin, including inside the mouth  seizures  signs and symptoms of aortic dissection such as sudden chest, stomach, or back pain  signs and symptoms of  high blood sugar such as being more thirsty or hungry or having to urinate more than normal. You may also feel very tired or have blurry vision.  signs and symptoms of liver injury like dark yellow or brown urine; general ill feeling or flu-like symptoms; light-colored stools; loss of appetite; nausea; right upper belly pain; unusually weak or tired; yellowing of the eyes or skin  signs and symptoms of low blood sugar such as feeling anxious; confusion; dizziness; increased hunger; unusually weak or tired; sweating; shakiness; cold; irritable; headache; blurred vision; fast heartbeat; loss of consciousness; pale skin  suicidal thoughts or other mood changes  sunburn  unusually weak or tired Side effects that usually do not require medical attention (report to your doctor or health care professional if they continue or are bothersome):  dry mouth  headache  nausea  trouble sleeping This list may not describe all possible side effects. Call your doctor for medical advice about side effects. You may report side effects to FDA at 1-800-FDA-1088. Where should I keep my medicine? Keep out of the reach of children. Store at room temperature below 30 degrees C (  86 degrees F). Keep container tightly closed. Throw away any unused medicine after the expiration date. NOTE: This sheet is a summary. It may not cover all possible information. If you have questions about this medicine, talk to your doctor, pharmacist, or health care provider.  2021 Elsevier/Gold Standard (2019-02-22 11:26:08) Urinary Tract Infection, Adult A urinary tract infection (UTI) is an infection of any part of the urinary tract. The urinary tract includes:  The kidneys.  The ureters.  The bladder.  The urethra. These organs make, store, and get rid of pee (urine) in the body. What are the causes? This infection is caused by germs (bacteria) in your genital area. These germs grow and cause swelling (inflammation) of  your urinary tract. What increases the risk? The following factors may make you more likely to develop this condition:  Using a small, thin tube (catheter) to drain pee.  Not being able to control when you pee or poop (incontinence).  Being female. If you are female, these things can increase the risk: ? Using these methods to prevent pregnancy:  A medicine that kills sperm (spermicide).  A device that blocks sperm (diaphragm). ? Having low levels of a female hormone (estrogen). ? Being pregnant. You are more likely to develop this condition if:  You have genes that add to your risk.  You are sexually active.  You take antibiotic medicines.  You have trouble peeing because of: ? A prostate that is bigger than normal, if you are female. ? A blockage in the part of your body that drains pee from the bladder. ? A kidney stone. ? A nerve condition that affects your bladder. ? Not getting enough to drink. ? Not peeing often enough.  You have other conditions, such as: ? Diabetes. ? A weak disease-fighting system (immune system). ? Sickle cell disease. ? Gout. ? Injury of the spine. What are the signs or symptoms? Symptoms of this condition include:  Needing to pee right away.  Peeing small amounts often.  Pain or burning when peeing.  Blood in the pee.  Pee that smells bad or not like normal.  Trouble peeing.  Pee that is cloudy.  Fluid coming from the vagina, if you are female.  Pain in the belly or lower back. Other symptoms include:  Vomiting.  Not feeling hungry.  Feeling mixed up (confused). This may be the first symptom in older adults.  Being tired and grouchy (irritable).  A fever.  Watery poop (diarrhea). How is this treated?  Taking antibiotic medicine.  Taking other medicines.  Drinking enough water. In some cases, you may need to see a specialist. Follow these instructions at home: Medicines  Take over-the-counter and prescription  medicines only as told by your doctor.  If you were prescribed an antibiotic medicine, take it as told by your doctor. Do not stop taking it even if you start to feel better. General instructions  Make sure you: ? Pee until your bladder is empty. ? Do not hold pee for a long time. ? Empty your bladder after sex. ? Wipe from front to back after peeing or pooping if you are a female. Use each tissue one time when you wipe.  Drink enough fluid to keep your pee pale yellow.  Keep all follow-up visits.   Contact a doctor if:  You do not get better after 1-2 days.  Your symptoms go away and then come back. Get help right away if:  You have very bad back pain.  You have very bad pain in your lower belly.  You have a fever.  You have chills.  You feeling like you will vomit or you vomit. Summary  A urinary tract infection (UTI) is an infection of any part of the urinary tract.  This condition is caused by germs in your genital area.  There are many risk factors for a UTI.  Treatment includes antibiotic medicines.  Drink enough fluid to keep your pee pale yellow. This information is not intended to replace advice given to you by your health care provider. Make sure you discuss any questions you have with your health care provider. Document Revised: 07/04/2020 Document Reviewed: 07/04/2020 Elsevier Patient Education  [place].

## 2021-05-12 NOTE — Telephone Encounter (Signed)
Access Nurse Documentation   

## 2021-05-12 NOTE — Telephone Encounter (Signed)
Patient has been seen.

## 2021-05-12 NOTE — Progress Notes (Signed)
Acute Office Visit  Subjective:    Patient ID: Lisa Petersen, female    DOB: October 26, 1936, 85 y.o.   MRN: [id number]  Chief Complaint  Patient presents with  . Urinary Tract Infection    Pt states shes having urinary frequency, smell to urine and not emptying bladder    Urinary Tract Infection  This is a new problem. The current episode started 1 to 4 weeks ago (one week ago. ). The problem occurs every urination. The problem has been unchanged. The quality of the pain is described as burning. The pain is at a severity of 0/10. There has been no fever. There is no history of pyelonephritis. Associated symptoms include frequency, hesitancy and urgency. Pertinent negatives include no chills, discharge, flank pain, hematuria, nausea, possible pregnancy, sweats or vomiting. She has tried nothing for the symptoms. The treatment provided no relief. There is no history of kidney stones or recurrent UTIs.   Patient  denies any fever, body aches,chills, rash, chest pain, shortness of breath, nausea, vomiting, or diarrhea.  Denies dizziness, lightheadedness, pre syncopal or syncopal episodes.     Past Medical History:  Diagnosis Date  . Allergy   . Anxiety   . Colon polyps   . Depression   . History of Clostridioides difficile colitis 2014  . History of endometriosis   . History of Graves' disease   . History of jaundice as a child    age 64  . Hyperlipidemia   . Hypothyroidism   . Insomnia   . OA (osteoarthritis)   . Osteopenia   . Plantar fasciitis   . Rheumatoid arthritis ([place])   . Sigmoid diverticulosis 2008  . Squamous cell carcinoma in situ 2017   Central chest  . UTI (lower urinary tract infection)   . Vitamin B 12 deficiency     Past Surgical History:  Procedure Laterality Date  . ABDOMINAL HYSTERECTOMY  1975   Bilateral oophorectomy  . CATARACT EXTRACTION Bilateral 2009  . COLECTOMY Right 2006  . COLONOSCOPY  2015  . KNEE ARTHROSCOPY  2011   after knee  replacement  . REPLACEMENT TOTAL KNEE Right 2011  . SKIN CANCER EXCISION  2017  . TONSILLECTOMY AND ADENOIDECTOMY  1950  . TOTAL KNEE ARTHROPLASTY Left 12/10/2019   Procedure: TOTAL KNEE ARTHROPLASTY;  Surgeon: Vickey Huger, MD;  Location: [facility];  Service: Orthopedics;  Laterality: Left;  75 mins needed for length of case    Family History  Problem Relation Age of Onset  . Hypertension Mother   . Hypertension Sister   . Mental illness Sister   . Dementia Sister   . Diabetes Brother   . Cancer Brother        Kidney/Bladder  . Dementia Father   . Parkinson's disease Brother   . Breast cancer Neg Hx     Social History   Socioeconomic History  . Marital status: Widowed    Spouse name: Not on file  . Number of children: Not on file  . Years of education: Not on file  . Highest education level: Not on file  Occupational History  . Not on file  Tobacco Use  . Smoking status: Never Smoker  . Smokeless tobacco: Never Used  Vaping Use  . Vaping Use: Never used  Substance and Sexual Activity  . Alcohol use: No  . Drug use: No  . Sexual activity: Yes    Birth control/protection: None  Other Topics Concern  . Not on file  Social History Narrative   Has a living will scanned into chart.   Social Determinants of Health   Financial Resource Strain: Low Risk   . Difficulty of Paying Living Expenses: Not hard at all  Food Insecurity: No Food Insecurity  . Worried About Charity fundraiser in the Last Year: Never true  . Ran Out of Food in the Last Year: Never true  Transportation Needs: No Transportation Needs  . Lack of Transportation (Medical): No  . Lack of Transportation (Non-Medical): No  Physical Activity: Not on file  Stress: No Stress Concern Present  . Feeling of Stress : Not at all  Social Connections: Unknown  . Frequency of Communication with Friends and Family: More than three times a week  . Frequency of Social Gatherings with Friends and Family: More than  three times a week  . Attends Religious Services: Not on file  . Active Member of Clubs or Organizations: Not on file  . Attends Archivist Meetings: Not on file  . Marital Status: Not on file  Intimate Partner Violence: Not At Risk  . Fear of Current or Ex-Partner: No  . Emotionally Abused: No  . Physically Abused: No  . Sexually Abused: No    Outpatient Medications Prior to Visit  Medication Sig Dispense Refill  . Calcium Carbonate-Vitamin D (CALTRATE 600+D PO) Take 2 tablets by mouth daily.     . Cyanocobalamin (VITAMIN B-12 PO) Take 2,500 mcg by mouth daily.     Marland Kitchen levothyroxine (SYNTHROID) 112 MCG tablet TAKE 1 TABLET DAILY BEFORE BREAKFAST 90 tablet 2  . Misc Natural Products (OSTEO BI-FLEX JOINT SHIELD PO) Take 1 tablet by mouth 2 (two) times daily.     . Multiple Vitamins-Minerals (CENTRUM SILVER PO) Take 1 tablet by mouth daily.     . Multiple Vitamins-Minerals (PRESERVISION AREDS 2+MULTI VIT PO) Take 1 capsule by mouth 2 (two) times daily.    . traZODone (DESYREL) 50 MG tablet TAKE 1 TABLET AT BEDTIME ASNEEDED FOR SLEEP 90 tablet 1  . cetirizine (ZYRTEC) 10 MG tablet Take 0.5 tablets (5 mg total) by mouth daily. 30 tablet 0  . fluticasone (FLONASE) 50 MCG/ACT nasal spray Place 2 sprays into both nostrils daily. (Patient not taking: Reported on 05/12/2021) 16 g 6  . sodium chloride (OCEAN) 0.65 % SOLN nasal spray Place 1 spray into both nostrils as needed for congestion. 30 mL 0   No facility-administered medications prior to visit.    Allergies  Allergen Reactions  . Amoxicillin-Pot Clavulanate Diarrhea  . Etodolac Other (See Comments)    May have raised transaminases    . Morphine And Related Nausea And Vomiting  . Oxycontin [Oxycodone Hcl] Nausea Only  . Cephalexin Rash  . Metronidazole Nausea Only and Other (See Comments)    dyspepsia   . Sulfa Antibiotics Rash    Review of Systems  Constitutional: Negative for chills.  Gastrointestinal: Negative for  nausea and vomiting.  Genitourinary: Positive for frequency, hesitancy and urgency. Negative for flank pain and hematuria.       Objective:    Physical Exam Vitals reviewed.  Constitutional:      General: She is not in acute distress.    Appearance: Normal appearance. She is not ill-appearing, toxic-appearing or diaphoretic.  HENT:     Head: Normocephalic and atraumatic.     Right Ear: External ear normal.     Left Ear: External ear normal.     Nose: Nose normal.  Mouth/Throat:     Mouth: Mucous membranes are moist.  Eyes:     Conjunctiva/sclera: Conjunctivae normal.  Cardiovascular:     Rate and Rhythm: Normal rate and regular rhythm.     Pulses: Normal pulses.     Heart sounds: Normal heart sounds. No murmur heard. No friction rub. No gallop.   Pulmonary:     Effort: Pulmonary effort is normal.     Breath sounds: Normal breath sounds.  Abdominal:     General: There is no distension.     Palpations: Abdomen is soft.     Tenderness: There is no abdominal tenderness.  Musculoskeletal:        General: Normal range of motion.     Cervical back: Normal range of motion and neck supple.  Skin:    General: Skin is warm.     Findings: No erythema or rash.  Neurological:     Mental Status: She is oriented to person, place, and time.  Psychiatric:        Mood and Affect: Mood normal.        Behavior: Behavior normal.        Thought Content: Thought content normal.        Judgment: Judgment normal.     BP 122/84   Pulse 60   Temp 98.5 F (36.9 C)   Ht 5' 7.01" (1.702 m)   Wt 143 lb 12.8 oz (65.2 kg)   SpO2 95%   BMI 22.52 kg/m  Wt Readings from Last 3 Encounters:  05/12/21 143 lb 12.8 oz (65.2 kg)  01/12/21 142 lb (64.4 kg)  12/02/20 141 lb (64 kg)    Health Maintenance Due  Topic Date Due  . Pneumococcal Vaccine 29-71 Years old (1 of 4 - PCV13) Never done  . COVID-19 Vaccine (4 - Booster for Pfizer series) 11/20/2020    There are no preventive care  reminders to display for this patient.   Lab Results  Component Value Date   TSH 1.00 06/18/2020   Lab Results  Component Value Date   WBC 4.1 06/18/2020   HGB 13.0 06/18/2020   HCT 38.6 06/18/2020   MCV 91.6 06/18/2020   PLT 215.0 06/18/2020   Lab Results  Component Value Date   NA 139 06/18/2020   K 4.1 06/18/2020   CO2 31 06/18/2020   GLUCOSE 97 06/18/2020   BUN 16 06/18/2020   CREATININE 0.88 06/18/2020   BILITOT 0.4 06/18/2020   ALKPHOS 44 06/18/2020   AST 14 06/18/2020   ALT 12 06/18/2020   PROT 6.2 06/18/2020   ALBUMIN 4.2 06/18/2020   CALCIUM 9.2 06/18/2020   ANIONGAP 8 12/06/2019   GFR 61.53 06/18/2020   Lab Results  Component Value Date   CHOL 169 06/18/2020   Lab Results  Component Value Date   HDL 52.00 06/18/2020   Lab Results  Component Value Date   LDLCALC 97 06/18/2020   Lab Results  Component Value Date   TRIG 98.0 06/18/2020   Lab Results  Component Value Date   CHOLHDL 3 06/18/2020   No results found for: HGBA1C     Assessment & Plan:   Problem List Items Addressed This Visit      Genitourinary   Urinary tract infection without hematuria - Primary     Other   Urinary symptom or sign   Relevant Orders   Urinalysis, Routine w reflex microscopic   CULTURE, URINE COMPREHENSIVE     Urinary tract infection without hematuria,  site unspecified  Urinary symptom or sign - Plan: Urinalysis, Routine w reflex microscopic, CULTURE, URINE COMPREHENSIVE will treat prophylactic with Cipro today and call if culture and sensitivity requires antibiotic changes.   Meds ordered this encounter  Medications  . ciprofloxacin (CIPRO) 500 MG tablet    Sig: Take 1 tablet (500 mg total) by mouth 2 (two) times daily for 5 days.    Dispense:  10 tablet    Refill:  0   Red Flags discussed. The patient was given clear instructions to go to ER or return to medical center if any red flags develop, symptoms do not improve, worsen or new problems  develop. They verbalized understanding.  Return if symptoms worsen or fail to improve, for at any time for any worsening symptoms, Go to Emergency room/ urgent care if worse.  Marcille Buffy, FNP

## 2021-05-13 NOTE — Progress Notes (Signed)
Urinalysis is within normal limits- will wait on urine culture how are her symptoms ?

## 2021-05-14 LAB — CULTURE, URINE COMPREHENSIVE
MICRO NUMBER:: 11978360
RESULT:: NO GROWTH
SPECIMEN QUALITY:: ADEQUATE

## 2021-05-14 NOTE — Progress Notes (Signed)
No growth on urine culture, if she is continuing to have symptoms she should return to the office for further work-up and evaluation.

## 2021-06-17 DIAGNOSIS — L570 Actinic keratosis: Secondary | ICD-10-CM | POA: Diagnosis not present

## 2021-06-17 DIAGNOSIS — L538 Other specified erythematous conditions: Secondary | ICD-10-CM | POA: Diagnosis not present

## 2021-06-17 DIAGNOSIS — D2262 Melanocytic nevi of left upper limb, including shoulder: Secondary | ICD-10-CM | POA: Diagnosis not present

## 2021-06-17 DIAGNOSIS — L820 Inflamed seborrheic keratosis: Secondary | ICD-10-CM | POA: Diagnosis not present

## 2021-06-17 DIAGNOSIS — Z85828 Personal history of other malignant neoplasm of skin: Secondary | ICD-10-CM | POA: Diagnosis not present

## 2021-06-17 DIAGNOSIS — D2261 Melanocytic nevi of right upper limb, including shoulder: Secondary | ICD-10-CM | POA: Diagnosis not present

## 2021-06-17 DIAGNOSIS — D1722 Benign lipomatous neoplasm of skin and subcutaneous tissue of left arm: Secondary | ICD-10-CM | POA: Diagnosis not present

## 2021-06-17 DIAGNOSIS — D2271 Melanocytic nevi of right lower limb, including hip: Secondary | ICD-10-CM | POA: Diagnosis not present

## 2021-06-17 DIAGNOSIS — D485 Neoplasm of uncertain behavior of skin: Secondary | ICD-10-CM | POA: Diagnosis not present

## 2021-06-29 ENCOUNTER — Other Ambulatory Visit: Payer: Self-pay

## 2021-06-29 ENCOUNTER — Encounter: Payer: Self-pay | Admitting: Family Medicine

## 2021-06-29 ENCOUNTER — Ambulatory Visit (INDEPENDENT_AMBULATORY_CARE_PROVIDER_SITE_OTHER): Payer: Medicare HMO | Admitting: Family Medicine

## 2021-06-29 VITALS — BP 130/80 | HR 64 | Temp 98.7°F | Ht 67.0 in | Wt 143.4 lb

## 2021-06-29 DIAGNOSIS — Z0001 Encounter for general adult medical examination with abnormal findings: Secondary | ICD-10-CM

## 2021-06-29 DIAGNOSIS — Z130 Encounter for screening for diseases of the blood and blood-forming organs and certain disorders involving the immune mechanism: Secondary | ICD-10-CM

## 2021-06-29 DIAGNOSIS — H9313 Tinnitus, bilateral: Secondary | ICD-10-CM

## 2021-06-29 DIAGNOSIS — Z13220 Encounter for screening for lipoid disorders: Secondary | ICD-10-CM

## 2021-06-29 DIAGNOSIS — E039 Hypothyroidism, unspecified: Secondary | ICD-10-CM

## 2021-06-29 LAB — COMPREHENSIVE METABOLIC PANEL
ALT: 14 U/L (ref 0–35)
AST: 17 U/L (ref 0–37)
Albumin: 4 g/dL (ref 3.5–5.2)
Alkaline Phosphatase: 49 U/L (ref 39–117)
BUN: 14 mg/dL (ref 6–23)
CO2: 29 mEq/L (ref 19–32)
Calcium: 9 mg/dL (ref 8.4–10.5)
Chloride: 104 mEq/L (ref 96–112)
Creatinine, Ser: 0.89 mg/dL (ref 0.40–1.20)
GFR: 60.16 mL/min (ref >60.00)
Glucose, Bld: 92 mg/dL (ref 70–99)
Potassium: 4.7 mEq/L (ref 3.5–5.1)
Sodium: 141 mEq/L (ref 135–145)
Total Bilirubin: 0.5 mg/dL (ref 0.2–1.2)
Total Protein: 6.1 g/dL (ref 6.0–8.3)

## 2021-06-29 LAB — T4, FREE: Free T4: 1.16 ng/dL (ref 0.60–1.60)

## 2021-06-29 LAB — CBC
HCT: 38.6 % (ref 36.0–46.0)
Hemoglobin: 13.1 g/dL (ref 12.0–15.0)
MCHC: 34 g/dL (ref 30.0–36.0)
MCV: 90 fl (ref 78.0–100.0)
Platelets: 202 10*3/uL (ref 150.0–400.0)
RBC: 4.29 Mil/uL (ref 3.87–5.11)
RDW: 13.4 % (ref 11.5–15.5)
WBC: 5 10*3/uL (ref 4.0–10.5)

## 2021-06-29 LAB — LIPID PANEL
Cholesterol: 177 mg/dL (ref 0–200)
HDL: 49.1 mg/dL (ref >39.00)
LDL Cholesterol: 97 mg/dL (ref 0–99)
NonHDL: 127.93
Total CHOL/HDL Ratio: 4
Triglycerides: 153 mg/dL — ABNORMAL HIGH (ref 0.0–149.0)
VLDL: 30.6 mg/dL (ref 0.0–40.0)

## 2021-06-29 LAB — TSH: TSH: 0.32 u[IU]/mL — ABNORMAL LOW (ref 0.35–5.50)

## 2021-06-29 MED ORDER — TETANUS-DIPHTHERIA TOXOIDS TD 5-2 LFU IM INJ: 0.5000 mL | INJECTION | Freq: Once | INTRAMUSCULAR | 0 refills | Status: AC

## 2021-06-29 NOTE — Assessment & Plan Note (Signed)
She is going to contact ENT for evaluation of the cerumen impaction in her right ear.  She declined ear irrigation today given prior adverse issues with ear irrigation.  She will discuss her tinnitus with ENT when she sees them.

## 2021-06-29 NOTE — Patient Instructions (Signed)
Nice to see you. We will get lab work today. Please try to stay active eat a relatively healthy diet. Please contact ENT for evaluation of your earwax as well as your ringing in ears. Please go to the pharmacy to get your tetanus vaccine.

## 2021-06-29 NOTE — Assessment & Plan Note (Signed)
Physical exam completed.  I encouraged healthy diet activity levels.  Mammogram is up-to-date.  Discussed if she develops postmenopausal bleeding she needs to let us know.  Her DEXA scan is up-to-date.  I encouraged her to get her tetanus vaccine at the pharmacy.  Lab work as outlined.

## 2021-06-29 NOTE — Progress Notes (Signed)
Tommi Rumps, MD Phone: [phone]  Lisa Petersen is a 85 y.o. female who presents today for CPE  Diet: Generally eats what ever she wants, she does try to limit grease and is not eating as much meat as she used to, she reports is not overdoing anything food wise.  Rare sweet tea Exercise: Tries to walk or ride a bike 2 times a week Pap smear: Aged out Colonoscopy: Aged out Mammogram: 03/06/2021 negative Family history-  Colon cancer: No  Breast cancer: No  Ovarian cancer: No Menses: Hysterectomy Vaccines-   Flu: Out of season  Tetanus: Due  Shingles: Up-to-date  COVID19: Up-to-date  Pneumonia: Up-to-date Tobacco use: no Alcohol use: no Illicit Drug use: no Dentist: yes Ophthalmology: yes  Tinnitus: Patient notes this has been going on since she had COVID-19.  It sounds like static in both of her ears.  It is intermittent.  She hears it more at night.  No hearing loss.  No ear fullness.  She does have a history of cerumen impaction particularly in her right ear.  Reports occasionally hearing her heartbeat.   Active Ambulatory Problems    Diagnosis Date Noted   Hypothyroidism 11/14/2014   Insomnia 06/17/2015   History of squamous cell carcinoma of skin 04/07/2016   Anxiety and depression 05/21/2016   Encounter for general adult medical examination with abnormal findings 10/07/2016   Bilateral finger numbness 04/06/2017   Osteoarthritis 04/06/2017   Axillary fullness 07/19/2019   Urinary symptom or sign 05/12/2021   Tinnitus of both ears 06/29/2021   Resolved Ambulatory Problems    Diagnosis Date Noted   Protracted upper respiratory infection 11/14/2014   Right lower quadrant abdominal mass 11/14/2014   Well woman exam 06/12/2015   Medicare annual wellness visit, subsequent 06/17/2015   Grief 06/17/2015   Dizziness and giddiness 11/12/2015   Cerumen impaction 11/12/2015   UTI (urinary tract infection) 05/21/2016   Sinusitis 03/09/2018   Borderline blood  pressure 03/09/2018   Urinary tract infection without hematuria 10/17/2019   Respiratory illness 01/12/2021   Past Medical History:  Diagnosis Date   Allergy    Anxiety    Colon polyps    Depression    History of Clostridioides difficile colitis 2014   History of endometriosis    History of Graves' disease    History of jaundice as a child    Hyperlipidemia    OA (osteoarthritis)    Osteopenia    Plantar fasciitis    Rheumatoid arthritis ([place])    Sigmoid diverticulosis 2008   Squamous cell carcinoma in situ 2017   UTI (lower urinary tract infection)    Vitamin B 12 deficiency     Family History  Problem Relation Age of Onset   Hypertension Mother    Hypertension Sister    Mental illness Sister    Dementia Sister    Diabetes Brother    Cancer Brother        Kidney/Bladder   Dementia Father    Parkinson's disease Brother    Breast cancer Neg Hx     Social History   Socioeconomic History   Marital status: Widowed    Spouse name: Not on file   Number of children: Not on file   Years of education: Not on file   Highest education level: Not on file  Occupational History   Not on file  Tobacco Use   Smoking status: Never   Smokeless tobacco: Never  Vaping Use   Vaping Use: Never  used  Substance and Sexual Activity   Alcohol use: No   Drug use: No   Sexual activity: Yes    Birth control/protection: None  Other Topics Concern   Not on file  Social History Narrative   Has a living will scanned into chart.   Social Determinants of Health   Financial Resource Strain: Low Risk    Difficulty of Paying Living Expenses: Not hard at all  Food Insecurity: No Food Insecurity   Worried About Charity fundraiser in the Last Year: Never true   [place] in the Last Year: Never true  Transportation Needs: No Transportation Needs   Lack of Transportation (Medical): No   Lack of Transportation (Non-Medical): No  Physical Activity: Not on file  Stress: No Stress  Concern Present   Feeling of Stress : Not at all  Social Connections: Unknown   Frequency of Communication with Friends and Family: More than three times a week   Frequency of Social Gatherings with Friends and Family: More than three times a week   Attends Religious Services: Not on Electrical engineer or Organizations: Not on file   Attends Archivist Meetings: Not on file   Marital Status: Not on file  Intimate Partner Violence: Not At Risk   Fear of Current or Ex-Partner: No   Emotionally Abused: No   Physically Abused: No   Sexually Abused: No    ROS  General:  Negative for nexplained weight loss, fever Skin: Negative for new or changing mole, sore that won't heal HEENT: Positive for tinnitus, negative for trouble hearing, trouble seeing, mouth sores, hoarseness, change in voice, dysphagia. CV:  Negative for chest pain, dyspnea, edema, palpitations Resp: Negative for cough, dyspnea, hemoptysis GI: Negative for nausea, vomiting, diarrhea, constipation, abdominal pain, melena, hematochezia. GU: Negative for dysuria, incontinence, urinary hesitance, hematuria, vaginal or penile discharge, polyuria, sexual difficulty, lumps in testicle or breasts MSK: Negative for muscle cramps or aches, joint pain or swelling Neuro: Negative for headaches, weakness, numbness, dizziness, passing out/fainting Psych: Negative for depression, anxiety, memory problems  Objective  Physical Exam Vitals:   06/29/21 0811 06/29/21 0833  BP: 130/80   Pulse: 64   Temp: 98.7 F (37.1 C)   SpO2: 92% 96%    BP Readings from Last 3 Encounters:  06/29/21 130/80  05/12/21 122/84  11/14/20 135/78   Wt Readings from Last 3 Encounters:  06/29/21 143 lb 6.4 oz (65 kg)  05/12/21 143 lb 12.8 oz (65.2 kg)  01/12/21 142 lb (64.4 kg)    Physical Exam Constitutional:      General: She is not in acute distress.    Appearance: She is not diaphoretic.  HENT:     Left Ear: Tympanic  membrane normal.     Ears:     Comments: Right TM is obscured by cerumen Eyes:     Conjunctiva/sclera: Conjunctivae normal.     Pupils: Pupils are equal, round, and reactive to light.  Cardiovascular:     Rate and Rhythm: Normal rate and regular rhythm.     Heart sounds: Normal heart sounds.     Comments: No carotid bruits bilaterally Pulmonary:     Effort: Pulmonary effort is normal.     Breath sounds: Normal breath sounds.  Abdominal:     General: Bowel sounds are normal. There is no distension.     Palpations: Abdomen is soft.     Tenderness: There is no abdominal  tenderness. There is no guarding or rebound.  Musculoskeletal:     Right lower leg: No edema.     Left lower leg: No edema.  Lymphadenopathy:     Cervical: No cervical adenopathy.  Skin:    General: Skin is warm and dry.  Neurological:     Mental Status: She is alert.  Psychiatric:        Mood and Affect: Mood normal.     Assessment/Plan:   Problem List Items Addressed This Visit     Encounter for general adult medical examination with abnormal findings - Primary    Physical exam completed.  I encouraged healthy diet activity levels.  Mammogram is up-to-date.  Discussed if she develops postmenopausal bleeding she needs to let us know.  Her DEXA scan is up-to-date.  I encouraged her to get her tetanus vaccine at the pharmacy.  Lab work as outlined.       Hypothyroidism   Relevant Orders   TSH   T4, free   Tinnitus of both ears    She is going to contact ENT for evaluation of the cerumen impaction in her right ear.  She declined ear irrigation today given prior adverse issues with ear irrigation.  She will discuss her tinnitus with ENT when she sees them.       Other Visit Diagnoses     Lipid screening       Relevant Orders   Comp Met (CMET)   Lipid panel   Screening for deficiency anemia       Relevant Orders   CBC       Return in about 1 year (around 06/29/2022) for CPE.  This visit  occurred during the SARS-CoV-2 public health emergency.  Safety protocols were in place, including screening questions prior to the visit, additional usage of staff PPE, and extensive cleaning of exam room while observing appropriate contact time as indicated for disinfecting solutions.    Tommi Rumps, MD [place]

## 2021-07-03 ENCOUNTER — Telehealth: Payer: Self-pay | Admitting: Family Medicine

## 2021-07-03 DIAGNOSIS — E039 Hypothyroidism, unspecified: Secondary | ICD-10-CM

## 2021-07-03 NOTE — Telephone Encounter (Signed)
Patient called and wants Dr Caryl Bis to call her on Monday about her lab results. Patient was offered to find someone to speak to and she stated, "NO I want Dr Caryl Bis to call me on Monday". Please call her her cell phone, [phone].

## 2021-07-06 MED ORDER — LEVOTHYROXINE SODIUM 100 MCG PO TABS: 100.0000 ug | ORAL_TABLET | Freq: Every day | ORAL | 1 refills | Status: DC

## 2021-07-06 NOTE — Telephone Encounter (Signed)
I called and spoke with the patient regarding her labs.  She reports some cold intolerance as well as some difficulty breathing when going upstairs.  She notes those were the same symptoms she had when she had hyperthyroid issues previously.  We will decrease her dose of Synthroid to 100 mcg once daily.  She will have follow-up labs in 6 weeks.  If her symptoms are not improving with this dose change she will let us know.  I advised that her minimally elevated triglycerides were not worrisome to me.  Discussed monitoring her diet.  Lab appointment was scheduled for 08/17/2021 at 9 AM.

## 2021-07-06 NOTE — Addendum Note (Signed)
Addended by: Caryl Bis, Fusako Tanabe G on: 07/06/2021 01:14 PM   Modules accepted: Orders

## 2021-07-20 DIAGNOSIS — H6063 Unspecified chronic otitis externa, bilateral: Secondary | ICD-10-CM | POA: Diagnosis not present

## 2021-07-20 DIAGNOSIS — H6123 Impacted cerumen, bilateral: Secondary | ICD-10-CM | POA: Diagnosis not present

## 2021-08-12 ENCOUNTER — Other Ambulatory Visit: Payer: Self-pay | Admitting: Family Medicine

## 2021-08-17 ENCOUNTER — Other Ambulatory Visit (INDEPENDENT_AMBULATORY_CARE_PROVIDER_SITE_OTHER): Payer: Medicare HMO

## 2021-08-17 ENCOUNTER — Other Ambulatory Visit: Payer: Self-pay

## 2021-08-17 DIAGNOSIS — E039 Hypothyroidism, unspecified: Secondary | ICD-10-CM

## 2021-08-17 LAB — TSH: TSH: 0.59 u[IU]/mL (ref 0.35–5.50)

## 2021-08-19 ENCOUNTER — Other Ambulatory Visit (INDEPENDENT_AMBULATORY_CARE_PROVIDER_SITE_OTHER): Payer: Medicare HMO

## 2021-08-19 ENCOUNTER — Other Ambulatory Visit: Payer: Self-pay

## 2021-08-19 ENCOUNTER — Telehealth: Payer: Self-pay | Admitting: Family Medicine

## 2021-08-19 DIAGNOSIS — R300 Dysuria: Secondary | ICD-10-CM

## 2021-08-19 LAB — POCT URINALYSIS DIPSTICK
Glucose, UA: NEGATIVE
Nitrite, UA: NEGATIVE
Protein, UA: POSITIVE — AB
Spec Grav, UA: <=1.005 — AB (ref 1.010–1.025)
Urobilinogen, UA: 1 E.U./dL
pH, UA: >=9 — AB (ref 5.0–8.0)

## 2021-08-19 MED ORDER — CIPROFLOXACIN HCL 250 MG PO TABS: 250.0000 mg | ORAL_TABLET | Freq: Two times a day (BID) | ORAL | 0 refills | Status: AC

## 2021-08-19 NOTE — Telephone Encounter (Signed)
Cipro was sent to the patient's local [facility].  We will await her urine culture results.  If she has any worsening symptoms despite being on Cipro she should be evaluated in person.  If she develops the inability to urinate with the blood in her urine she needs to go to the emergency department.

## 2021-08-19 NOTE — Telephone Encounter (Signed)
Patient calling in and states she woke up this morning with what she thinks is a bladder infection. She was wanting orders placed and medication sent in. Patient states her urine was full of blood this morning.   Informed the Patient that she would need to be evaluated for these symptoms before any medication or lab orders could be placed. Patient asked why as she knows what is wrong with her? Informed the Patient that many things could present the same way and the provider needs to evaluate her.   Patient informed that there were no appointments available in our office. Offered Patient an appointment in [place]. Patient declined. Informed the Patient of the [facility] on [address] in [place]. Patient states she will go be seen there.   For your information

## 2021-08-19 NOTE — Telephone Encounter (Signed)
I called the patient and informed her that her medication was sent to the pharmacy I also informed her if she has any symptoms while on the medicine she needed to be seen in person.  Patient was also informed if she cannot urinate she needed to go to the ER.  Lisa Petersen,cma

## 2021-08-19 NOTE — Telephone Encounter (Signed)
Reason for Triage Call /in person: In person  Symptoms: Moderate Pain after urination X 2 days with dark rd colored urine today only .   Pain Scale:none, present - not adequately treated, location, [place], level of pain (1-10, 10 severe), 10,   Vitals: BP __128/88______ , Pulse ___73_______,  02 Sats  _97________, Respirations _________, Temp ___________,   Onset: X 2 days   Duration:  Medications:medication not used, Patient  took some left over Cipro from One year ago.  Last seen for this problem: 08/2020  Outcome: UA and culture sent to lab showed preliminary POCT Ua to provider given verbal to use OTC AZO to help with pain, ask patient patient about ABX she stated she uses only Ciprofloxacin, advised PCP calling in medication Cipro , but to please notto use the old to take to pharmacy drop off box to discard.

## 2021-08-19 NOTE — Addendum Note (Signed)
Addended by: Leeanne Rio on: 08/19/2021 03:40 PM   Modules accepted: Orders

## 2021-08-19 NOTE — Telephone Encounter (Signed)
Late documentation patient has been placed on lab schedule and patient was advised we are going to collect UA and provider will determine appropriate treatment, no appt available with provider but we would check urine today , but if any worsening symptoms she would need to be seen such as fever chills or pain.

## 2021-08-20 LAB — URINE CULTURE
MICRO NUMBER:: 12373120
SPECIMEN QUALITY:: ADEQUATE

## 2021-08-20 LAB — URINALYSIS, ROUTINE W REFLEX MICROSCOPIC
Bilirubin Urine: NEGATIVE
Glucose, UA: NEGATIVE
Ketones, ur: NEGATIVE
Nitrite: POSITIVE — AB
Specific Gravity, Urine: 1.011 (ref 1.001–1.035)
Squamous Epithelial / HPF: NONE SEEN /HPF (ref <=5)
WBC, UA: >=60 /HPF — AB (ref 0–5)
pH: <=5 (ref 5.0–8.0)

## 2021-08-20 LAB — MICROSCOPIC MESSAGE

## 2021-08-24 ENCOUNTER — Other Ambulatory Visit: Payer: Self-pay | Admitting: Family Medicine

## 2021-08-24 DIAGNOSIS — R310 Gross hematuria: Secondary | ICD-10-CM

## 2021-09-14 ENCOUNTER — Other Ambulatory Visit (INDEPENDENT_AMBULATORY_CARE_PROVIDER_SITE_OTHER): Payer: Medicare HMO

## 2021-09-14 ENCOUNTER — Other Ambulatory Visit: Payer: Self-pay

## 2021-09-14 DIAGNOSIS — R310 Gross hematuria: Secondary | ICD-10-CM

## 2021-09-14 LAB — URINALYSIS, MICROSCOPIC ONLY

## 2021-09-14 LAB — POCT URINALYSIS DIPSTICK
Bilirubin, UA: NEGATIVE
Glucose, UA: NEGATIVE
Ketones, UA: NEGATIVE
Nitrite, UA: NEGATIVE
Protein, UA: NEGATIVE
Spec Grav, UA: 1.02 (ref 1.010–1.025)
Urobilinogen, UA: 0.2 E.U./dL
pH, UA: 5.5 (ref 5.0–8.0)

## 2021-09-15 DIAGNOSIS — H353132 Nonexudative age-related macular degeneration, bilateral, intermediate dry stage: Secondary | ICD-10-CM | POA: Diagnosis not present

## 2021-09-15 DIAGNOSIS — H26493 Other secondary cataract, bilateral: Secondary | ICD-10-CM | POA: Diagnosis not present

## 2021-09-15 DIAGNOSIS — H52223 Regular astigmatism, bilateral: Secondary | ICD-10-CM | POA: Diagnosis not present

## 2021-09-15 DIAGNOSIS — Z9842 Cataract extraction status, left eye: Secondary | ICD-10-CM | POA: Diagnosis not present

## 2021-09-15 DIAGNOSIS — H5203 Hypermetropia, bilateral: Secondary | ICD-10-CM | POA: Diagnosis not present

## 2021-09-15 DIAGNOSIS — Z9841 Cataract extraction status, right eye: Secondary | ICD-10-CM | POA: Diagnosis not present

## 2021-09-15 LAB — HM DIABETES EYE EXAM

## 2021-09-29 ENCOUNTER — Encounter: Payer: Self-pay | Admitting: General Surgery

## 2021-10-23 ENCOUNTER — Ambulatory Visit: Payer: Medicare HMO | Admitting: Family Medicine

## 2021-10-23 ENCOUNTER — Other Ambulatory Visit: Payer: Self-pay

## 2021-10-23 ENCOUNTER — Ambulatory Visit (INDEPENDENT_AMBULATORY_CARE_PROVIDER_SITE_OTHER): Payer: Medicare HMO | Admitting: Family

## 2021-10-23 ENCOUNTER — Telehealth: Payer: Self-pay | Admitting: Family Medicine

## 2021-10-23 ENCOUNTER — Encounter: Payer: Self-pay | Admitting: Family

## 2021-10-23 VITALS — BP 132/100 | HR 86 | Temp 97.6°F | Ht 67.0 in | Wt 143.0 lb

## 2021-10-23 DIAGNOSIS — R350 Frequency of micturition: Secondary | ICD-10-CM | POA: Diagnosis not present

## 2021-10-23 DIAGNOSIS — R319 Hematuria, unspecified: Secondary | ICD-10-CM | POA: Diagnosis not present

## 2021-10-23 DIAGNOSIS — N3001 Acute cystitis with hematuria: Secondary | ICD-10-CM

## 2021-10-23 DIAGNOSIS — R399 Unspecified symptoms and signs involving the genitourinary system: Secondary | ICD-10-CM | POA: Diagnosis not present

## 2021-10-23 LAB — POCT URINALYSIS DIP (MANUAL ENTRY)
Bilirubin, UA: NEGATIVE
Glucose, UA: NEGATIVE mg/dL
Ketones, POC UA: NEGATIVE mg/dL
Nitrite, UA: NEGATIVE
Protein Ur, POC: 100 mg/dL — AB
Spec Grav, UA: 1.02 (ref 1.010–1.025)
Urobilinogen, UA: 0.2 E.U./dL
pH, UA: 6 (ref 5.0–8.0)

## 2021-10-23 MED ORDER — CIPROFLOXACIN HCL 500 MG PO TABS: 250.0000 mg | ORAL_TABLET | Freq: Two times a day (BID) | ORAL | 0 refills | Status: DC

## 2021-10-23 MED ORDER — CIPROFLOXACIN HCL 250 MG PO TABS: 250.0000 mg | ORAL_TABLET | Freq: Two times a day (BID) | ORAL | 0 refills | Status: DC

## 2021-10-23 NOTE — Patient Instructions (Addendum)
It was a pleasure seeing you today.   Ordered CT scan, stat, pending results. We are trying to rule out kidney stone. If you start to have worsening back pain, increased blood in urine, and or fever/chills/ please go to the ER.  You were found to have a urinary tract infection, you have been prescribed an antibiotic to your preferred pharmacy. Please start antibiotic today as directed.   We are sending your urine for a culture to make sure you do not have a resistant bacteria. We will call you if we need to change your medications.   Please make sure you are drinking plenty of fluids over the next few days.  Regards,   Eugenia Pancoast

## 2021-10-23 NOTE — Telephone Encounter (Signed)
Called and spoke with pharmacist, informed her I am sending an updated rx for cipro 250 mg bid x 5 days #10 no refills.  Pharmacist verified.

## 2021-10-23 NOTE — Progress Notes (Signed)
Established Patient Office Visit  Subjective:  Patient ID: Lisa Petersen, female    DOB: 1936-06-29  Age: 85 y.o. MRN: [id number]  CC:  Chief Complaint  Patient presents with   Urinary Tract Infection    Frequency. Could barely go. Had blood and clots come out, wet on herself 3 times yesterday and now she has horrible smell to her urine. Has an appointment Wednesday Urologist.     HPI Lisa Petersen is here today with c/o urinary frequency but small amount of urine coming out, foul odor to urine, cloudy appearing urine, dysuria, and low back pain. Back does not hurt today. She is dribbling urine as well throughout the day needing to use a pad. She noticed yesterday there was some blood in the urine. About six months ago also had similar symptoms with + UTI.  Started drinking a lot of water.   Denies fever/chills.denies pelvic pain and or abdominal pain.  Has not taken any medications for symptoms.   Did make an appt with urology and is seeing him Wednesday.    Past Medical History:  Diagnosis Date   Allergy    Anxiety    Colon polyps    Depression    History of Clostridioides difficile colitis 2014   History of endometriosis    History of Graves' disease    History of jaundice as a child    age 59   Hyperlipidemia    Hypothyroidism    Insomnia    OA (osteoarthritis)    Osteopenia    Plantar fasciitis    Rheumatoid arthritis ([place])    Sigmoid diverticulosis 2008   Squamous cell carcinoma in situ 2017   Central chest   UTI (lower urinary tract infection)    Vitamin B 12 deficiency     Past Surgical History:  Procedure Laterality Date   ABDOMINAL HYSTERECTOMY  1975   Bilateral oophorectomy   CATARACT EXTRACTION Bilateral 2009   COLECTOMY Right 2006   COLONOSCOPY  2015   KNEE ARTHROSCOPY  2011   after knee replacement   REPLACEMENT TOTAL KNEE Right 2011   SKIN CANCER EXCISION  2017   TONSILLECTOMY AND ADENOIDECTOMY  1950   TOTAL KNEE ARTHROPLASTY Left  12/10/2019   Procedure: TOTAL KNEE ARTHROPLASTY;  Surgeon: Vickey Huger, MD;  Location: [facility];  Service: Orthopedics;  Laterality: Left;  75 mins needed for length of case    Family History  Problem Relation Age of Onset   Hypertension Mother    Hypertension Sister    Mental illness Sister    Dementia Sister    Diabetes Brother    Cancer Brother        Kidney/Bladder   Dementia Father    Parkinson's disease Brother    Breast cancer Neg Hx     Social History   Socioeconomic History   Marital status: Widowed    Spouse name: Not on file   Number of children: Not on file   Years of education: Not on file   Highest education level: Not on file  Occupational History   Not on file  Tobacco Use   Smoking status: Never   Smokeless tobacco: Never  Vaping Use   Vaping Use: Never used  Substance and Sexual Activity   Alcohol use: No   Drug use: No   Sexual activity: Yes    Birth control/protection: None  Other Topics Concern   Not on file  Social History Narrative   Has a living  will scanned into chart.   Social Determinants of Health   Financial Resource Strain: Low Risk    Difficulty of Paying Living Expenses: Not hard at all  Food Insecurity: No Food Insecurity   Worried About Charity fundraiser in the Last Year: Never true   [place] in the Last Year: Never true  Transportation Needs: No Transportation Needs   Lack of Transportation (Medical): No   Lack of Transportation (Non-Medical): No  Physical Activity: Not on file  Stress: No Stress Concern Present   Feeling of Stress : Not at all  Social Connections: Unknown   Frequency of Communication with Friends and Family: More than three times a week   Frequency of Social Gatherings with Friends and Family: More than three times a week   Attends Religious Services: Not on Electrical engineer or Organizations: Not on file   Attends Archivist Meetings: Not on file   Marital Status: Not on  file  Intimate Partner Violence: Not At Risk   Fear of Current or Ex-Partner: No   Emotionally Abused: No   Physically Abused: No   Sexually Abused: No    Outpatient Medications Prior to Visit  Medication Sig Dispense Refill   amoxicillin (AMOXIL) 500 MG capsule SMARTSIG:4 Capsule(s) By Mouth     Calcium Carbonate-Vitamin D (CALTRATE 600+D PO) Take 2 tablets by mouth daily.      Cyanocobalamin (VITAMIN B-12 PO) Take 2,500 mcg by mouth daily.      levothyroxine (SYNTHROID) 100 MCG tablet Take 1 tablet (100 mcg total) by mouth daily before breakfast. 90 tablet 1   Misc Natural Products (OSTEO BI-FLEX JOINT SHIELD PO) Take 1 tablet by mouth 2 (two) times daily.      Multiple Vitamins-Minerals (CENTRUM SILVER PO) Take 1 tablet by mouth daily.      Multiple Vitamins-Minerals (PRESERVISION AREDS 2+MULTI VIT PO) Take 1 capsule by mouth 2 (two) times daily.     traZODone (DESYREL) 50 MG tablet TAKE 1 TABLET AT BEDTIME ASNEEDED FOR SLEEP 90 tablet 1   No facility-administered medications prior to visit.    Allergies  Allergen Reactions   Amoxicillin-Pot Clavulanate Diarrhea   Etodolac Other (See Comments)    May have raised transaminases     Morphine And Related Nausea And Vomiting   Oxycontin [Oxycodone Hcl] Nausea Only   Cephalexin Rash   Metronidazole Nausea Only and Other (See Comments)    dyspepsia    Sulfa Antibiotics Rash    ROS Review of Systems  Constitutional:  Negative for chills and fever.  Gastrointestinal:  Negative for abdominal pain.  Genitourinary:  Positive for difficulty urinating (urinary odor), dysuria, frequency, hematuria and urgency. Negative for flank pain, pelvic pain and vaginal discharge.     Objective:    Physical Exam Constitutional:      General: She is not in acute distress.    Appearance: Normal appearance. She is well-developed and well-groomed. She is not ill-appearing.  Abdominal:     Tenderness: There is abdominal tenderness in the right  lower quadrant and suprapubic area.     Comments: Mild   Musculoskeletal:     Lumbar back: Normal. No tenderness.  Neurological:     Mental Status: She is alert.    BP (!) 132/100   Pulse 86   Temp 97.6 F (36.4 C) (Temporal)   Ht 5\' 7"  (1.702 m)   Wt 143 lb (64.9 kg)   SpO2 97%  BMI 22.40 kg/m  Wt Readings from Last 3 Encounters:  10/23/21 143 lb (64.9 kg)  06/29/21 143 lb 6.4 oz (65 kg)  05/12/21 143 lb 12.8 oz (65.2 kg)     Health Maintenance Due  Topic Date Due   COVID-19 Vaccine (5 - Booster for Pfizer series) 06/06/2021    There are no preventive care reminders to display for this patient.  Lab Results  Component Value Date   TSH 0.59 08/17/2021   Lab Results  Component Value Date   WBC 5.0 06/29/2021   HGB 13.1 06/29/2021   HCT 38.6 06/29/2021   MCV 90.0 06/29/2021   PLT 202.0 06/29/2021   Lab Results  Component Value Date   NA 141 06/29/2021   K 4.7 06/29/2021   CO2 29 06/29/2021   GLUCOSE 92 06/29/2021   BUN 14 06/29/2021   CREATININE 0.89 06/29/2021   BILITOT 0.5 06/29/2021   ALKPHOS 49 06/29/2021   AST 17 06/29/2021   ALT 14 06/29/2021   PROT 6.1 06/29/2021   ALBUMIN 4.0 06/29/2021   CALCIUM 9.0 06/29/2021   ANIONGAP 8 12/06/2019   GFR 60.16 06/29/2021   Lab Results  Component Value Date   CHOL 177 06/29/2021   Lab Results  Component Value Date   HDL 49.10 06/29/2021   Lab Results  Component Value Date   LDLCALC 97 06/29/2021   Lab Results  Component Value Date   TRIG 153.0 (H) 06/29/2021   Lab Results  Component Value Date   CHOLHDL 4 06/29/2021   No results found for: HGBA1C    Assessment & Plan:   Problem List Items Addressed This Visit       Genitourinary   Acute cystitis with hematuria - Primary    Ciprofloxacin prescribed to take as directed.  Advised patient to increase oral fluids.  Pending CT to r/o kidney stone. Pt with follow-up scheduled with urologist on Wednesday patient informed to keep as scheduled.   If any worsening symptoms fever and/or chills or new onset back pain go to the emergency room      Relevant Medications   ciprofloxacin (CIPRO) 250 MG tablet   Other Relevant Orders   Urine Culture     Other   Hematuria    CT ordered for renal protocol, was ordered stat however pt refused said she can not go today. Referral placed, pending precert. Informed pt she will receive a call for further scheduling. However; advised pt that if she has any worsening symptoms fever, chills and or new onset back pain patient needs to go to the emergency room.  Patient has follow-up appointment scheduled with urology next Wednesday, follow-up as scheduled.      Relevant Orders   CT RENAL STONE STUDY   Urine Culture   Other Visit Diagnoses     UTI symptoms       Relevant Orders   POCT urinalysis dipstick (Completed)   Urine Culture   Urinary frequency       Relevant Orders   Urine Culture       Meds ordered this encounter  Medications   DISCONTD: ciprofloxacin (CIPRO) 500 MG tablet    Sig: Take 0.5 tablets (250 mg total) by mouth 2 (two) times daily for 5 days.    Dispense:  10 tablet    Refill:  0    Order Specific Question:   Supervising Provider    Answer:   BEDSOLE, AMY E [[id number]]   ciprofloxacin (CIPRO) 250 MG tablet  Sig: Take 1 tablet (250 mg total) by mouth 2 (two) times daily for 5 days.    Dispense:  10 tablet    Refill:  0    Order Specific Question:   Supervising Provider    Answer:   Diona Browner, AMY E [[id number]]     Follow-up: Return in about 2 years (around 10/24/2023), or if symptoms worsen or fail to improve.    Eugenia Pancoast, FNP

## 2021-10-23 NOTE — Assessment & Plan Note (Signed)
Ciprofloxacin prescribed to take as directed.  Advised patient to increase oral fluids.  Pending CT to r/o kidney stone. Pt with follow-up scheduled with urologist on Wednesday patient informed to keep as scheduled.  If any worsening symptoms fever and/or chills or new onset back pain go to the emergency room

## 2021-10-23 NOTE — Telephone Encounter (Signed)
Pt was seen today 11/18 at 8:40. Pt was prescribed ciprofloxacin and pharmacy is requesting verbal approval. [facility] uses [id number] [address], [place]

## 2021-10-23 NOTE — Assessment & Plan Note (Signed)
CT ordered for renal protocol, was ordered stat however pt refused said she can not go today. Referral placed, pending precert. Informed pt she will receive a call for further scheduling. However; advised pt that if she has any worsening symptoms fever, chills and or new onset back pain patient needs to go to the emergency room.  Patient has follow-up appointment scheduled with urology next Wednesday, follow-up as scheduled.

## 2021-10-25 LAB — URINE CULTURE
MICRO NUMBER:: 12657390
Result:: NO GROWTH
SPECIMEN QUALITY:: ADEQUATE

## 2021-10-26 ENCOUNTER — Ambulatory Visit
Admission: RE | Admit: 2021-10-26 | Discharge: 2021-10-26 | Disposition: A | Discharge status: Home / Self Care | Payer: Medicare HMO | Source: Ambulatory Visit | Attending: Family | Admitting: Family

## 2021-10-26 ENCOUNTER — Telehealth: Payer: Self-pay | Admitting: Family

## 2021-10-26 DIAGNOSIS — R319 Hematuria, unspecified: Secondary | ICD-10-CM | POA: Insufficient documentation

## 2021-10-26 DIAGNOSIS — R300 Dysuria: Secondary | ICD-10-CM | POA: Diagnosis not present

## 2021-10-26 DIAGNOSIS — N200 Calculus of kidney: Secondary | ICD-10-CM | POA: Diagnosis not present

## 2021-10-26 DIAGNOSIS — R350 Frequency of micturition: Secondary | ICD-10-CM | POA: Diagnosis not present

## 2021-10-26 NOTE — Telephone Encounter (Signed)
Called and spoke with patient and discussed her CAT scan of the abdomen and pelvis without contrast.  She has an appointment with urology on Wednesday that she is going to be continuing to go to.  She is still taking her antibiotic ciprofloxacin as prescribed and she does state that her symptoms of dysuria hematuria and urinary frequency have since resolved and she is feeling much better.  The CAT scan did show some aortic arthrosclerosis which I explained to the patient and discussed low-cholesterol diet as well as follow-up with cardiologist as indicated.  She is aware.  I also told her that it showed some pericystic edema that is mostly consistent with a cystitis which she is being treated for.  On the CAT scan multiple small renal lesions are seen on the right kidney that are suggestive of simple cysts and there is a left kidney cyst that is suggestive of a minimally complex cyst.  She has been advised to follow-up with urology as scheduled as well for further evaulation.  Discussed with patient if any fever chills or onset of sudden symptoms please go to the emergency room

## 2021-10-28 ENCOUNTER — Ambulatory Visit: Payer: Medicare HMO | Admitting: Urology

## 2021-10-28 ENCOUNTER — Other Ambulatory Visit: Payer: Self-pay

## 2021-10-28 ENCOUNTER — Encounter: Payer: Self-pay | Admitting: Urology

## 2021-10-28 VITALS — BP 148/77 | HR 71 | Ht 67.0 in | Wt 140.0 lb

## 2021-10-28 DIAGNOSIS — N289 Disorder of kidney and ureter, unspecified: Secondary | ICD-10-CM

## 2021-10-28 DIAGNOSIS — R399 Unspecified symptoms and signs involving the genitourinary system: Secondary | ICD-10-CM

## 2021-10-28 DIAGNOSIS — R310 Gross hematuria: Secondary | ICD-10-CM

## 2021-10-28 DIAGNOSIS — R300 Dysuria: Secondary | ICD-10-CM | POA: Diagnosis not present

## 2021-10-28 LAB — MICROSCOPIC EXAMINATION: Bacteria, UA: NONE SEEN

## 2021-10-28 LAB — URINALYSIS, COMPLETE
Bilirubin, UA: NEGATIVE
Glucose, UA: NEGATIVE
Ketones, UA: NEGATIVE
Leukocytes,UA: NEGATIVE
Nitrite, UA: NEGATIVE
Protein,UA: NEGATIVE
RBC, UA: NEGATIVE
Specific Gravity, UA: 1.01 (ref 1.005–1.030)
Urobilinogen, Ur: 0.2 mg/dL (ref 0.2–1.0)
pH, UA: 7 (ref 5.0–7.5)

## 2021-10-28 NOTE — Progress Notes (Signed)
10/28/2021 11:30 AM   Lorie Apley 1936-07-31 [id number]  Referring provider: Leone Haven, MD [place],  [place] [address]  Chief Complaint  Patient presents with   Dysuria    HPI: Lisa Petersen is an 85 y.o. female who presents for evaluation of gross hematuria.  Episode frequency, urgency and dysuria 08/19/2021.  This was associated with "urine looking like pure blood" A urinalysis was ordered which was grossly bloody She was started on Cipro empirically x3 days and a urine culture had no significant growth Last week she had cute onset of urinary frequency, voiding small amounts and urgency with recurrent gross hematuria which she described as thick blood in her urine.  She did note increased odor to her urine. She was started back on Cipro.  A noncontrast CT was ordered to evaluate for kidney stone which showed a 3 mm nonobstructing left renal calculus and multiple bilateral renal lesions felt to represent cysts some probable complex cysts Urine culture was negative Her hematuria has resolved   PMH: Past Medical History:  Diagnosis Date   Allergy    Anxiety    Colon polyps    Depression    History of Clostridioides difficile colitis 2014   History of endometriosis    History of Graves' disease    History of jaundice as a child    age 45   Hyperlipidemia    Hypothyroidism    Insomnia    OA (osteoarthritis)    Osteopenia    Plantar fasciitis    Rheumatoid arthritis ([place])    Sigmoid diverticulosis 2008   Squamous cell carcinoma in situ 2017   Central chest   UTI (lower urinary tract infection)    Vitamin B 12 deficiency     Surgical History: Past Surgical History:  Procedure Laterality Date   ABDOMINAL HYSTERECTOMY  1975   Bilateral oophorectomy   CATARACT EXTRACTION Bilateral 2009   COLECTOMY Right 2006   COLONOSCOPY  2015   KNEE ARTHROSCOPY  2011   after knee replacement   REPLACEMENT TOTAL KNEE Right 2011   SKIN  CANCER EXCISION  2017   TONSILLECTOMY AND ADENOIDECTOMY  1950   TOTAL KNEE ARTHROPLASTY Left 12/10/2019   Procedure: TOTAL KNEE ARTHROPLASTY;  Surgeon: Vickey Huger, MD;  Location: [facility];  Service: Orthopedics;  Laterality: Left;  75 mins needed for length of case    Home Medications:  Allergies as of 10/28/2021       Reactions   Amoxicillin-pot Clavulanate Diarrhea   Etodolac Other (See Comments)   May have raised transaminases   Morphine And Related Nausea And Vomiting   Oxycontin [oxycodone Hcl] Nausea Only   Cephalexin Rash   Metronidazole Nausea Only, Other (See Comments)   dyspepsia   Sulfa Antibiotics Rash        Medication List        Accurate as of October 28, 2021 11:30 AM. If you have any questions, ask your nurse or doctor.          STOP taking these medications    ciprofloxacin 250 MG tablet Commonly known as: Cipro Stopped by: Abbie Sons, MD       TAKE these medications    amoxicillin 500 MG capsule Commonly known as: AMOXIL SMARTSIG:4 Capsule(s) By Mouth   CALTRATE 600+D PO Take 2 tablets by mouth daily.   PRESERVISION AREDS 2+MULTI VIT PO Take 1 capsule by mouth 2 (two) times daily.   CENTRUM SILVER PO Take  1 tablet by mouth daily.   levothyroxine 100 MCG tablet Commonly known as: SYNTHROID Take 1 tablet (100 mcg total) by mouth daily before breakfast.   OSTEO BI-FLEX JOINT SHIELD PO Take 1 tablet by mouth 2 (two) times daily.   traZODone 50 MG tablet Commonly known as: DESYREL TAKE 1 TABLET AT BEDTIME ASNEEDED FOR SLEEP   VITAMIN B-12 PO Take 2,500 mcg by mouth daily.        Allergies:  Allergies  Allergen Reactions   Amoxicillin-Pot Clavulanate Diarrhea   Etodolac Other (See Comments)    May have raised transaminases     Morphine And Related Nausea And Vomiting   Oxycontin [Oxycodone Hcl] Nausea Only   Cephalexin Rash   Metronidazole Nausea Only and Other (See Comments)    dyspepsia    Sulfa Antibiotics  Rash    Family History: Family History  Problem Relation Age of Onset   Hypertension Mother    Hypertension Sister    Mental illness Sister    Dementia Sister    Diabetes Brother    Cancer Brother        Kidney/Bladder   Dementia Father    Parkinson's disease Brother    Breast cancer Neg Hx     Social History:  reports that she has never smoked. She has never used smokeless tobacco. She reports that she does not drink alcohol and does not use drugs.   Physical Exam: BP (!) 148/77   Pulse 71   Ht 5\' 7"  (1.702 m)   Wt 140 lb (63.5 kg)   BMI 21.93 kg/m   Constitutional:  Alert and oriented, No acute distress. HEENT: [place] AT, moist mucus membranes.  Trachea midline, no masses. Cardiovascular: No clubbing, cyanosis, or edema. Respiratory: Normal respiratory effort, no increased work of breathing. Neurologic: Grossly intact, no focal deficits, moving all 4 extremities. Psychiatric: Normal mood and affect.  Laboratory Data:  Urinalysis Dipstick negative/microscopy 3-10 RBC   Pertinent Imaging: CT images were personally reviewed and interpreted  CT RENAL STONE STUDY  Narrative CLINICAL DATA:  urinary frequency. Dysuria. Hematuria. Kidney stone suspected.  EXAM: CT ABDOMEN AND PELVIS WITHOUT CONTRAST  TECHNIQUE: Multidetector CT imaging of the abdomen and pelvis was performed following the standard protocol without IV contrast.  COMPARISON:  10/18/2014  FINDINGS: Lower chest: Left base scarring. Normal heart size without pericardial or pleural effusion.  Hepatobiliary: Normal liver. Normal gallbladder, without biliary ductal dilatation.  Pancreas: Normal, without mass or ductal dilatation.  Spleen: Normal in size, without focal abnormality.  Adrenals/Urinary Tract: Normal adrenal glands.  Hypoattenuating right renal lesions are too small to characterize, especially on this noncontrast exam. An anterior inter/lower pole right renal 11 mm lesion on 34/2 is  slightly hyperattenuating, but is at the site of a larger simple cyst on the prior exam of 1.3 cm.  An anterior upper/interpolar left renal lesion of 1.9 cm measures slightly greater than fluid density but was similar in morphology on 10/18/2014, favoring a minimally complex cyst.  Lateral interpolar left renal 2.5 cm lesion measures slightly greater than fluid density today but at the site of a simple cyst on the prior exam.  Other low-density lesions are likely cysts.  3 mm interpolar left renal collecting system calculus. No hydronephrosis. No hydroureter or ureteric calculi.  No bladder calculi.  Pericystic edema is moderate on 78/2.  Stomach/Bowel: Normal stomach, without wall thickening. Surgical changes in the ascending colon. Normal small bowel.  Vascular/Lymphatic: Aortic atherosclerosis. Circumaortic left renal vein. No  abdominopelvic adenopathy.  Reproductive: Hysterectomy.  No adnexal mass.  Other: Mild to moderate pelvic floor laxity. No significant free fluid. No free intraperitoneal air.  Musculoskeletal: No acute osseous abnormality.  IMPRESSION: 1. Left nephrolithiasis, without obstructive uropathy. 2. Pericystic edema, most consistent with cystitis. 3. Multiple bilateral renal lesions, favored to represent cysts and complex cysts, as detailed above. These are incompletely characterized on this noncontrast exam. 4.  Aortic Atherosclerosis (ICD10-I70.0).   Electronically Signed By: Abigail Miyamoto M.D. On: 10/26/2021 15:47   Assessment & Plan:    1.  Gross hematuria Recurrent episodes gross hematuria associated with storage related voiding symptoms and negative urine cultures She only had a noncontrast CT and we discussed the recommended standard evaluation for gross hematuria is a CT urogram which is a three-phase study with and without contrast We will go ahead and schedule cystoscopy for lower tract evaluation Based on her potentially complex renal  cyst she will eventually need CT urogram for further evaluation  2.  Renal lesions Triphasic imaging as described above   Abbie Sons, MD  [facility] [address], [place], [place] [address] [phone]

## 2021-11-16 DIAGNOSIS — M545 Low back pain, unspecified: Secondary | ICD-10-CM | POA: Diagnosis not present

## 2021-12-03 ENCOUNTER — Ambulatory Visit: Payer: Medicare HMO

## 2021-12-04 ENCOUNTER — Other Ambulatory Visit: Payer: Self-pay

## 2021-12-04 ENCOUNTER — Ambulatory Visit: Payer: Medicare HMO | Admitting: Urology

## 2021-12-04 ENCOUNTER — Encounter: Payer: Self-pay | Admitting: Urology

## 2021-12-04 VITALS — BP 116/78 | HR 85 | Ht 67.0 in | Wt 140.0 lb

## 2021-12-04 DIAGNOSIS — R310 Gross hematuria: Secondary | ICD-10-CM | POA: Diagnosis not present

## 2021-12-04 LAB — URINALYSIS, COMPLETE
Bilirubin, UA: NEGATIVE
Glucose, UA: NEGATIVE
Ketones, UA: NEGATIVE
Leukocytes,UA: NEGATIVE
Nitrite, UA: NEGATIVE
Protein,UA: NEGATIVE
RBC, UA: NEGATIVE
Specific Gravity, UA: 1.015 (ref 1.005–1.030)
Urobilinogen, Ur: 0.2 mg/dL (ref 0.2–1.0)
pH, UA: 7 (ref 5.0–7.5)

## 2021-12-04 LAB — MICROSCOPIC EXAMINATION: Bacteria, UA: NONE SEEN

## 2021-12-04 NOTE — Progress Notes (Signed)
° °  12/04/21  CC:  Chief Complaint  Patient presents with   Cysto    HPI: Refer to my prior note 10/28/2021.  Denies recurrent dysuria or gross hematuria  Blood pressure 116/78, pulse 85, height 5\' 7"  (1.702 m), weight 140 lb (63.5 kg). NED. A&Ox3.   No respiratory distress   Abd soft, NT, ND Atrophic external genitalia with patent urethral meatus  Cystoscopy Procedure Note  Patient identification was confirmed, informed consent was obtained, and patient was prepped using Betadine solution.  Lidocaine jelly was administered per urethral meatus.    Procedure: - Flexible cystoscope introduced, without any difficulty.   - Thorough search of the bladder revealed:    normal urethral meatus    normal urothelium    no stones    no ulcers     no tumors    no urethral polyps    no trabeculation  - Ureteral orifices were normal in position and appearance.  Post-Procedure: - Patient tolerated the procedure well  Assessment/ Plan: No bladder mucosal abnormalities identified on cystoscopy UA today without RBCs She did not have a CT urogram and since she had fairly significant gross hematuria recommend scheduling to further evaluate for an upper tract source    Abbie Sons, MD

## 2021-12-14 ENCOUNTER — Other Ambulatory Visit: Payer: Self-pay

## 2021-12-14 ENCOUNTER — Telehealth: Payer: Self-pay | Admitting: Family Medicine

## 2021-12-14 DIAGNOSIS — E039 Hypothyroidism, unspecified: Secondary | ICD-10-CM

## 2021-12-14 MED ORDER — LEVOTHYROXINE SODIUM 100 MCG PO TABS: 100.0000 ug | ORAL_TABLET | Freq: Every day | ORAL | 1 refills | Status: DC

## 2021-12-14 NOTE — Telephone Encounter (Signed)
Pt need levothyroxine refill automatic 90 day supply sent through mail order. Pt only has another week left.

## 2021-12-25 ENCOUNTER — Other Ambulatory Visit: Payer: Self-pay

## 2021-12-25 ENCOUNTER — Ambulatory Visit
Admission: RE | Admit: 2021-12-25 | Discharge: 2021-12-25 | Disposition: A | Discharge status: Home / Self Care | Payer: Medicare HMO | Source: Ambulatory Visit | Attending: Urology | Admitting: Urology

## 2021-12-25 DIAGNOSIS — K5730 Diverticulosis of large intestine without perforation or abscess without bleeding: Secondary | ICD-10-CM | POA: Diagnosis not present

## 2021-12-25 DIAGNOSIS — R310 Gross hematuria: Secondary | ICD-10-CM | POA: Diagnosis not present

## 2021-12-25 DIAGNOSIS — N200 Calculus of kidney: Secondary | ICD-10-CM | POA: Diagnosis not present

## 2021-12-25 DIAGNOSIS — I700 Atherosclerosis of aorta: Secondary | ICD-10-CM | POA: Diagnosis not present

## 2021-12-25 DIAGNOSIS — N281 Cyst of kidney, acquired: Secondary | ICD-10-CM | POA: Diagnosis not present

## 2021-12-25 LAB — POCT I-STAT CREATININE: Creatinine, Ser: 1 mg/dL (ref 0.44–1.00)

## 2021-12-25 MED ORDER — IOHEXOL 350 MG/ML SOLN
100.0000 mL | Freq: Once | INTRAVENOUS | Status: AC | PRN
  Administered 2021-12-25: 100 mL via INTRAVENOUS

## 2021-12-28 ENCOUNTER — Telehealth: Payer: Self-pay | Admitting: *Deleted

## 2021-12-28 DIAGNOSIS — N200 Calculus of kidney: Secondary | ICD-10-CM

## 2021-12-28 NOTE — Telephone Encounter (Signed)
-----   Message from Nori Riis, PA-C sent at 12/28/2021 10:55 AM EST ----- Please let Lisa Petersen know that her CT scan shows a left kidney stone and cysts in both kidneys.  I recommend to follow up with Dr. Bernardo Heater in 6 months for recheck of urine and KUB for recheck on her stone.

## 2021-12-28 NOTE — Telephone Encounter (Signed)
Notified patient as instructed, patient pleased. Discussed follow-up appointments, patient agrees  

## 2022-01-08 DIAGNOSIS — G4700 Insomnia, unspecified: Secondary | ICD-10-CM | POA: Diagnosis not present

## 2022-01-08 DIAGNOSIS — Z809 Family history of malignant neoplasm, unspecified: Secondary | ICD-10-CM | POA: Diagnosis not present

## 2022-01-08 DIAGNOSIS — R030 Elevated blood-pressure reading, without diagnosis of hypertension: Secondary | ICD-10-CM | POA: Diagnosis not present

## 2022-01-08 DIAGNOSIS — Z7722 Contact with and (suspected) exposure to environmental tobacco smoke (acute) (chronic): Secondary | ICD-10-CM | POA: Diagnosis not present

## 2022-01-08 DIAGNOSIS — H3530 Unspecified macular degeneration: Secondary | ICD-10-CM | POA: Diagnosis not present

## 2022-01-08 DIAGNOSIS — Z820 Family history of epilepsy and other diseases of the nervous system: Secondary | ICD-10-CM | POA: Diagnosis not present

## 2022-01-08 DIAGNOSIS — Z85828 Personal history of other malignant neoplasm of skin: Secondary | ICD-10-CM | POA: Diagnosis not present

## 2022-01-08 DIAGNOSIS — M8580 Other specified disorders of bone density and structure, unspecified site: Secondary | ICD-10-CM | POA: Diagnosis not present

## 2022-01-08 DIAGNOSIS — E039 Hypothyroidism, unspecified: Secondary | ICD-10-CM | POA: Diagnosis not present

## 2022-01-08 DIAGNOSIS — Z833 Family history of diabetes mellitus: Secondary | ICD-10-CM | POA: Diagnosis not present

## 2022-01-08 DIAGNOSIS — Z8249 Family history of ischemic heart disease and other diseases of the circulatory system: Secondary | ICD-10-CM | POA: Diagnosis not present

## 2022-01-08 DIAGNOSIS — M1990 Unspecified osteoarthritis, unspecified site: Secondary | ICD-10-CM | POA: Diagnosis not present

## 2022-01-18 ENCOUNTER — Telehealth: Payer: Self-pay | Admitting: Family Medicine

## 2022-01-18 NOTE — Telephone Encounter (Signed)
Patient tested positive for COVID on Saturday. She has had symptoms since Monday, 01/11/2022. She tested negative on 01/12/2022 for COVID.Fever running 99.3, heart racing at times, coughing. Patient stated she did not want to go to Urgent Care. No appointments available. Patient transferred to Access Nurse.

## 2022-01-19 ENCOUNTER — Telehealth: Payer: Self-pay

## 2022-01-19 NOTE — Telephone Encounter (Signed)
I called the patient to see if she needed to be scheduled for a virtual visit with a provider for her Covid symptoms and she declined and stated she is feeling much better and I spoke with her about the protocols of covid and to watch for her SOB. She understood.  Kamryn Messineo,cma

## 2022-01-25 ENCOUNTER — Other Ambulatory Visit: Payer: Self-pay | Admitting: Family Medicine

## 2022-01-25 DIAGNOSIS — Z1231 Encounter for screening mammogram for malignant neoplasm of breast: Secondary | ICD-10-CM

## 2022-03-04 ENCOUNTER — Other Ambulatory Visit: Payer: Self-pay

## 2022-03-04 DIAGNOSIS — E039 Hypothyroidism, unspecified: Secondary | ICD-10-CM

## 2022-03-04 MED ORDER — LEVOTHYROXINE SODIUM 100 MCG PO TABS: 100.0000 ug | ORAL_TABLET | Freq: Every day | ORAL | 1 refills | Status: DC

## 2022-03-18 ENCOUNTER — Ambulatory Visit (INDEPENDENT_AMBULATORY_CARE_PROVIDER_SITE_OTHER): Payer: Medicare HMO | Admitting: Internal Medicine

## 2022-03-18 ENCOUNTER — Encounter: Payer: Self-pay | Admitting: Internal Medicine

## 2022-03-18 DIAGNOSIS — J329 Chronic sinusitis, unspecified: Secondary | ICD-10-CM | POA: Diagnosis not present

## 2022-03-18 DIAGNOSIS — R0982 Postnasal drip: Secondary | ICD-10-CM | POA: Diagnosis not present

## 2022-03-18 MED ORDER — AZITHROMYCIN 250 MG PO TABS: ORAL_TABLET | ORAL | 0 refills | Status: AC

## 2022-03-18 NOTE — Progress Notes (Signed)
Telephone Note ? ?I connected with Lisa Petersen  ? on 03/18/22 at  9:20 AM EDT by a telephone and verified that I am speaking with the correct person using two identifiers. ? Location patient: Lisa Petersen ?Location provider:work or home office ?Persons participating in the virtual visit: patient, provider ? ?I discussed the limitations and requested verbal permission for telemedicine visit. The patient expressed understanding and agreed to proceed. ? ? ?HPI: ? ?Acute telemedicine visit for : ?Temp 99.1, 99.2 today and yesterday, sinus pressures and pain since Sunday, blood tinged sputum and yellow tried Mucinex DM. No nasal congestion. +covid home test 01/11/22 no medications taken did not go walk in clinic and did not have video visit  ? ?-Pertinent past medical history: see below ?-Pertinent medication allergies: ?Allergies  ?Allergen Reactions  ? Amoxicillin-Pot Clavulanate Diarrhea  ? Etodolac Other (See Comments)  ?  May have raised transaminases ? ?  ? Morphine And Related Nausea And Vomiting  ? Oxycontin [Oxycodone Hcl] Nausea Only  ? Cephalexin Rash  ? Metronidazole Nausea Only and Other (See Comments)  ?  dyspepsia ?  ? Sulfa Antibiotics Rash  ? ?-COVID-19 vaccine status:  ?Immunization History  ?Administered Date(s) Administered  ? Influenza Split 09/23/2021  ? Influenza, High Dose Seasonal PF 08/31/2018, 08/20/2019  ? Influenza-Unspecified 08/06/2014, 08/25/2015, 09/02/2016, 08/27/2020  ? PFIZER(Purple Top)SARS-COV-2 Vaccination 12/27/2019, 01/17/2020, 08/21/2020, 04/11/2021  ? Pneumococcal Conjugate-13 06/17/2015  ? Pneumococcal Polysaccharide-23 05/29/2009  ? Td 12/05/2006  ? Zoster Recombinat (Shingrix) 09/28/2018, 12/02/2018  ? Zoster, Live 12/21/2007  ? ? ? ?ROS: See pertinent positives and negatives per HPI. ? ?Past Medical History:  ?Diagnosis Date  ? Allergy   ? Anxiety   ? Colon polyps   ? Depression   ? History of Clostridioides difficile colitis 2014  ? History of endometriosis   ? History of Graves'  disease   ? History of jaundice as a child   ? age 86  ? Hyperlipidemia   ? Hypothyroidism   ? Insomnia   ? OA (osteoarthritis)   ? Osteopenia   ? Plantar fasciitis   ? Rheumatoid arthritis ([place])   ? Sigmoid diverticulosis 2008  ? Squamous cell carcinoma in situ 2017  ? Central chest  ? UTI (lower urinary tract infection)   ? Vitamin B 12 deficiency   ? ? ?Past Surgical History:  ?Procedure Laterality Date  ? ABDOMINAL HYSTERECTOMY  1975  ? Bilateral oophorectomy  ? CATARACT EXTRACTION Bilateral 2009  ? COLECTOMY Right 2006  ? COLONOSCOPY  2015  ? KNEE ARTHROSCOPY  2011  ? after knee replacement  ? REPLACEMENT TOTAL KNEE Right 2011  ? SKIN CANCER EXCISION  2017  ? TONSILLECTOMY AND ADENOIDECTOMY  1950  ? TOTAL KNEE ARTHROPLASTY Left 12/10/2019  ? Procedure: TOTAL KNEE ARTHROPLASTY;  Surgeon: Vickey Huger, MD;  Location: [facility];  Service: Orthopedics;  Laterality: Left;  75 mins needed for length of case  ? ? ? ?Current Outpatient Medications:  ?  Calcium Carbonate-Vitamin D (CALTRATE 600+D PO), Take 2 tablets by mouth daily. , Disp: , Rfl:  ?  Cyanocobalamin (VITAMIN B-12 PO), Take 2,500 mcg by mouth daily. , Disp: , Rfl:  ?  levothyroxine (SYNTHROID) 100 MCG tablet, Take 1 tablet (100 mcg total) by mouth daily before breakfast., Disp: 90 tablet, Rfl: 1 ?  Misc Natural Products (OSTEO BI-FLEX JOINT SHIELD PO), Take 1 tablet by mouth 2 (two) times daily. , Disp: , Rfl:  ?  Multiple Vitamins-Minerals (CENTRUM SILVER PO), Take  1 tablet by mouth daily. , Disp: , Rfl:  ?  Multiple Vitamins-Minerals (PRESERVISION AREDS 2+MULTI VIT PO), Take 1 capsule by mouth 2 (two) times daily., Disp: , Rfl:  ?  traZODone (DESYREL) 50 MG tablet, TAKE 1 TABLET AT BEDTIME ASNEEDED FOR SLEEP, Disp: 90 tablet, Rfl: 1 ?  amoxicillin (AMOXIL) 500 MG capsule, , Disp: , Rfl:  ?  azithromycin (ZITHROMAX) 250 MG tablet, With food Take 2 tablets on day 1, then 1 tablet daily on days 2 through 5, Disp: 6 tablet, Rfl: 0 ? ?EXAM: ? ?VITALS per patient  if applicable: ? ?GENERAL: alert, oriented, appears well and in no acute distress ? ?HEENT: atraumatic, conjunttiva clear, no obvious abnormalities on inspection of external nose and ears ?nt and cooperative, no obvious depression or anxiety, speech and thought processing grossly intact ? ?ASSESSMENT AND PLAN: ? ?Discussed the following assessment and plan: ? ?Sinusitis, unspecified chronicity, unspecified location - Plan: azithromycin (ZITHROMAX) 250 MG tablet ? ?PND (post-nasal drip) ?Pt will try allegra as well does not like nose sprays  ?Using mucinex otc as well  ? ?If needing prescription strength medication we will need to make an appointment with a provider.  ?These are over the counter medication options:  ?Mucinex dm green label for cough or robitussin DM  ?Multivitamin or below vitamins  ?Vitamin C 1000 mg daily.  ?Vitamin D3 4000 Iu (units) daily.  ?Zinc 100 mg daily.  ?Quercetin 250-500 mg 2 times per day   ?Elderberry  ?Oil of oregano  ?cepacol or chloroseptic spray ?Warm salt water gargles +hydrogen peroxide ?Sugar free cough drops  ?Warm tea with honey and lemon  ?Hydration  ?Try to eat though you dont feel like it   ?Tylenol or Advil  ?Nasal saline and Flonase 2 sprays nasal congestion  ?If sneezing/runny nose over the counter allergy pill claritin,allegra, zyrtec, xyzal ?Quarantine x 10-14 days 14 days preferred  ? ?Monitor pulse oximeter, buy from [facility] if oxygen is less than 90 please go to the hospital.  ? ?-we discussed possible serious and likely etiologies, options for evaluation and workup, limitations of telemedicine visit vs in person visit, treatment, treatment risks and precautions. Pt is agreeable to treatment via telemedicine at this moment.  ? ? ?I discussed the assessment and treatment plan with the patient. The patient was provided an opportunity to ask questions and all were answered. The patient agreed with the plan and demonstrated an understanding of the instructions. ?   ? ?Time spent 20 minutes ?Nino Glow McLean-Scocuzza, MD   ?

## 2022-03-18 NOTE — Patient Instructions (Signed)

## 2022-03-30 ENCOUNTER — Ambulatory Visit
Admission: RE | Admit: 2022-03-30 | Discharge: 2022-03-30 | Disposition: A | Discharge status: Home / Self Care | Payer: Medicare HMO | Source: Ambulatory Visit | Attending: Family Medicine | Admitting: Family Medicine

## 2022-03-30 DIAGNOSIS — Z1231 Encounter for screening mammogram for malignant neoplasm of breast: Secondary | ICD-10-CM

## 2022-04-20 ENCOUNTER — Ambulatory Visit (INDEPENDENT_AMBULATORY_CARE_PROVIDER_SITE_OTHER): Payer: Medicare HMO

## 2022-04-20 VITALS — Ht 67.0 in | Wt 140.0 lb

## 2022-04-20 DIAGNOSIS — Z0000 Encounter for general adult medical examination without abnormal findings: Secondary | ICD-10-CM

## 2022-04-20 NOTE — Patient Instructions (Addendum)
?  Ms. Lombardozzi , ?Thank you for taking time to come for your Medicare Wellness Visit. I appreciate your ongoing commitment to your health goals. Please review the following plan we discussed and let me know if I can assist you in the future.  ? ?These are the goals we discussed: ? Goals   ? ?  ? Patient Stated  ?   Follow up with Primary Care Provider (pt-stated)   ?   Maintain Healthy Lifestyle. ?  ?  ? Other  ?   Increase physical activity   ? ?  ?  ?This is a list of the screening recommended for you and due dates:  ?Health Maintenance  ?Topic Date Due  ? COVID-19 Vaccine (5 - Booster for Pfizer series) 05/06/2022*  ? Tetanus Vaccine  04/21/2023*  ? Flu Shot  07/06/2022  ? Pneumonia Vaccine  Completed  ? DEXA scan (bone density measurement)  Completed  ? Zoster (Shingles) Vaccine  Completed  ? HPV Vaccine  Aged Out  ?*Topic was postponed. The date shown is not the original due date.  ?  ?

## 2022-04-20 NOTE — Progress Notes (Signed)
Subjective:   Lisa Petersen is a 86 y.o. female who presents for Medicare Annual (Subsequent) preventive examination.  Review of Systems    No ROS.  Medicare Wellness Virtual Visit.  Visual/audio telehealth visit, UTA vital signs.   See social history for additional risk factors.   Cardiac Risk Factors include: advanced age (>39men, >26 women)     Objective:    Today's Vitals   04/20/22 0832  Weight: 140 lb (63.5 kg)  Height: 5\' 7"  (1.702 m)   Body mass index is 21.93 kg/m.     04/20/2022    8:41 AM 12/02/2020   10:50 AM 12/04/2019    8:13 AM 11/27/2019    8:43 AM 08/31/2018   11:50 AM  Advanced Directives  Does Patient Have a Medical Advance Directive? Yes Yes Yes Yes Yes  Type of Estate agent of [place];Living will Healthcare Power of [place];Living will;Out of facility DNR (pink MOST or yellow form) Healthcare Power of [place];Living will Living will;Healthcare Power of [employer] Power of [place];Living will  Does patient want to make changes to medical advance directive? No - Patient declined No - Patient declined No - Patient declined No - Patient declined No - Patient declined  Copy of Healthcare Power of Attorney in Chart? Yes - validated most recent copy scanned in chart (See row information) Yes - validated most recent copy scanned in chart (See row information) Yes - validated most recent copy scanned in chart (See row information) Yes - validated most recent copy scanned in chart (See row information) Yes    Current Medications (verified) Outpatient Encounter Medications as of 04/20/2022  Medication Sig   amoxicillin (AMOXIL) 500 MG capsule  (Patient not taking: Reported on 03/18/2022)   Calcium Carbonate-Vitamin D (CALTRATE 600+D PO) Take 2 tablets by mouth daily.    Cyanocobalamin (VITAMIN B-12 PO) Take 2,500 mcg by mouth daily.    levothyroxine (SYNTHROID) 100 MCG tablet Take 1 tablet (100 mcg total) by mouth daily before  breakfast.   Misc Natural Products (OSTEO BI-FLEX JOINT SHIELD PO) Take 1 tablet by mouth 2 (two) times daily.    Multiple Vitamins-Minerals (CENTRUM SILVER PO) Take 1 tablet by mouth daily.    Multiple Vitamins-Minerals (PRESERVISION AREDS 2+MULTI VIT PO) Take 1 capsule by mouth 2 (two) times daily.   traZODone (DESYREL) 50 MG tablet TAKE 1 TABLET AT BEDTIME ASNEEDED FOR SLEEP   No facility-administered encounter medications on file as of 04/20/2022.    Allergies (verified) Amoxicillin-pot clavulanate, Etodolac, Morphine and related, Oxycontin [oxycodone hcl], Cephalexin, Metronidazole, and Sulfa antibiotics   History: Past Medical History:  Diagnosis Date   Allergy    Anxiety    Colon polyps    COVID-19    01/2022 home test no antivirals taken   Depression    History of Clostridioides difficile colitis 2014   History of endometriosis    History of Graves' disease    History of jaundice as a child    age 10   Hyperlipidemia    Hypothyroidism    Insomnia    OA (osteoarthritis)    Osteopenia    Plantar fasciitis    Rheumatoid arthritis (HCC)    Sigmoid diverticulosis 2008   Squamous cell carcinoma in situ 2017   Central chest   UTI (lower urinary tract infection)    Vitamin B 12 deficiency    Past Surgical History:  Procedure Laterality Date   ABDOMINAL HYSTERECTOMY  1975   Bilateral oophorectomy  CATARACT EXTRACTION Bilateral 2009   COLECTOMY Right 2006   COLONOSCOPY  2015   KNEE ARTHROSCOPY  2011   after knee replacement   REPLACEMENT TOTAL KNEE Right 2011   SKIN CANCER EXCISION  2017   TONSILLECTOMY AND ADENOIDECTOMY  1950   TOTAL KNEE ARTHROPLASTY Left 12/10/2019   Procedure: TOTAL KNEE ARTHROPLASTY;  Surgeon: Dannielle Huh, MD;  Location: [facility];  Service: Orthopedics;  Laterality: Left;  75 mins needed for length of case   Family History  Problem Relation Age of Onset   Hypertension Mother    Hypertension Sister    Mental illness Sister    Dementia Sister     Diabetes Brother    Cancer Brother        Kidney/Bladder   Dementia Father    Parkinson's disease Brother    Breast cancer Neg Hx    Social History   Socioeconomic History   Marital status: Widowed    Spouse name: Not on file   Number of children: Not on file   Years of education: Not on file   Highest education level: Not on file  Occupational History   Not on file  Tobacco Use   Smoking status: Never   Smokeless tobacco: Never  Vaping Use   Vaping Use: Never used  Substance and Sexual Activity   Alcohol use: No   Drug use: No   Sexual activity: Yes    Birth control/protection: None  Other Topics Concern   Not on file  Social History Narrative   Has a living will scanned into chart.   Social Determinants of Health   Financial Resource Strain: Low Risk    Difficulty of Paying Living Expenses: Not hard at all  Food Insecurity: No Food Insecurity   Worried About Programme researcher, broadcasting/film/video in the Last Year: Never true   Ran Out of Food in the Last Year: Never true  Transportation Needs: No Transportation Needs   Lack of Transportation (Medical): No   Lack of Transportation (Non-Medical): No  Physical Activity: Not on file  Stress: No Stress Concern Present   Feeling of Stress : Not at all  Social Connections: Unknown   Frequency of Communication with Friends and Family: More than three times a week   Frequency of Social Gatherings with Friends and Family: More than three times a week   Attends Religious Services: More than 4 times per year   Active Member of [employer] or Organizations: Not on file   Attends Banker Meetings: Not on file   Marital Status: Not on file    Tobacco Counseling Counseling given: Not Answered   Clinical Intake:  Pre-visit preparation completed: Yes        Diabetes: No  How often do you need to have someone help you when you read instructions, pamphlets, or other written materials from your doctor or pharmacy?: 1 -  Never   Interpreter Needed?: No      Activities of Daily Living    04/20/2022    8:38 AM  In your present state of health, do you have any difficulty performing the following activities:  Hearing? 0  Vision? 0  Difficulty concentrating or making decisions? 0  Walking or climbing stairs? 0  Dressing or bathing? 0  Doing errands, shopping? 0  Preparing Food and eating ? N  Using the Toilet? N  In the past six months, have you accidently leaked urine? N  Comment Followed by Urology.  Do you  have problems with loss of bowel control? N  Managing your Medications? N  Managing your Finances? N  Housekeeping or managing your Housekeeping? N    Patient Care Team: Glori Luis, MD as PCP - General (Family Medicine) Nadeen Landau, MD as Referring Physician (Surgery) Dasher, Cliffton Asters, MD as Attending Physician (Dermatology) Galen Manila, MD as Referring Physician (Ophthalmology)  Indicate any recent Medical Services you may have received from other than [facility] providers in the past year (date may be approximate).     Assessment:   This is a routine wellness examination for [place].  Virtual Visit via Telephone Note  I connected with  LURA SHUSTERMAN on 04/20/22 at  8:15 AM EDT by telephone and verified that I am speaking with the correct person using two identifiers.  Persons participating in the virtual visit: patient/Nurse Health Advisor   I discussed the limitations of performing an evaluation and management service by telehealth. We continued and completed visit with audio only. Some vital signs may be absent or patient reported.   Hearing/Vision screen Hearing Screening - Comments:: Patient is able to hear conversational tones without difficulty.  No issues reported.  Vision Screening - Comments:: Followed by [facility] Wears corrective lenses  Cataract extraction, bilateral   Dietary issues and exercise activities discussed: Current  Exercise Habits: Home exercise routine, Type of exercise: walking, Intensity: Mild Healthy diet Good water intake   Goals Addressed               This Visit's Progress     Patient Stated     Follow up with Primary Care Provider (pt-stated)        Maintain Healthy Lifestyle.      Other     Increase physical activity         Depression Screen    04/20/2022    8:36 AM 03/18/2022    9:34 AM 05/12/2021   10:09 AM 12/02/2020   10:49 AM 06/18/2020    8:46 AM 11/27/2019    8:44 AM 10/17/2019   10:10 AM  PHQ 2/9 Scores  PHQ - 2 Score 0 0 0 0 0 0 0    Fall Risk    04/20/2022    8:38 AM 03/18/2022    9:34 AM 05/12/2021   10:09 AM 12/02/2020   10:52 AM 06/18/2020    8:45 AM  Fall Risk   Falls in the past year? 0 0 0 0 0  Number falls in past yr: 0 0 0 0 0  Injury with Fall?  0 0 0   Risk for fall due to :  No Fall Risks     Follow up Falls evaluation completed Falls evaluation completed Falls evaluation completed Falls evaluation completed Falls evaluation completed    FALL RISK PREVENTION PERTAINING TO THE HOME: Home free of loose throw rugs in walkways, pet beds, electrical cords, etc? Yes  Adequate lighting in your home to reduce risk of falls? Yes   ASSISTIVE DEVICES UTILIZED TO PREVENT FALLS: Life alert? No  Use of a cane, walker or w/c? No  Grab bars in the bathroom? No  Shower chair or bench in shower? No   TIMED UP AND GO: Was the test performed? No .   Cognitive Function: Patient is alert and oriented x3.     08/31/2018   11:57 AM  MMSE - Mini Mental State Exam  Orientation to time 5  Orientation to Place 5  Registration 3  Attention/  Calculation 5  Recall 3  Language- name 2 objects 2  Language- repeat 1  Language- follow 3 step command 3  Language- read & follow direction 1  Write a sentence 1  Copy design 1  Total score 30        11/27/2019    9:05 AM  6CIT Screen  What Year? 0 points  What month? 0 points  What time? 0 points  Count  back from 20 0 points  Months in reverse 0 points  Repeat phrase 0 points  Total Score 0 points    Immunizations Immunization History  Administered Date(s) Administered   Influenza Split 09/23/2021   Influenza, High Dose Seasonal PF 08/31/2018, 08/20/2019   Influenza-Unspecified 08/06/2014, 08/25/2015, 09/02/2016, 08/27/2020   PFIZER(Purple Top)SARS-COV-2 Vaccination 12/27/2019, 01/17/2020, 08/21/2020, 04/11/2021   Pneumococcal Conjugate-13 06/17/2015   Pneumococcal Polysaccharide-23 05/29/2009   Td 12/05/2006   Zoster Recombinat (Shingrix) 09/28/2018, 12/02/2018   Zoster, Live 12/21/2007    TDAP status: Due, Education has been provided regarding the importance of this vaccine. Advised may receive this vaccine at local pharmacy or Health Dept. Aware to provide a copy of the vaccination record if obtained from local pharmacy or Health Dept. Verbalized acceptance and understanding.  Screening Tests Health Maintenance  Topic Date Due   COVID-19 Vaccine (5 - Booster for Pfizer series) 05/06/2022 (Originally 06/06/2021)   TETANUS/TDAP  04/21/2023 (Originally 12/05/2016)   INFLUENZA VACCINE  07/06/2022   Pneumonia Vaccine 72+ Years old  Completed   DEXA SCAN  Completed   Zoster Vaccines- Shingrix  Completed   HPV VACCINES  Aged Out   Health Maintenance There are no preventive care reminders to display for this patient.  Lung Cancer Screening: (Low Dose CT Chest recommended if Age 64-80 years, 30 pack-year currently smoking OR have quit w/in 15years.) does not qualify.   Hepatitis C Screening: does not qualify.  Vision Screening: Recommended annual ophthalmology exams for early detection of glaucoma and other disorders of the eye.  Dental Screening: Recommended annual dental exams for proper oral hygiene  Community Resource Referral / Chronic Care Management: CRR required this visit?  No   CCM required this visit?  No      Plan:   Keep all routine maintenance appointments.    I have personally reviewed and noted the following in the patient's chart:   Medical and social history Use of alcohol, tobacco or illicit drugs  Current medications and supplements including opioid prescriptions.  Functional ability and status Nutritional status Physical activity Advanced directives List of other physicians Hospitalizations, surgeries, and ER visits in previous 12 months Vitals Screenings to include cognitive, depression, and falls Referrals and appointments  In addition, I have reviewed and discussed with patient certain preventive protocols, quality metrics, and best practice recommendations. A written personalized care plan for preventive services as well as general preventive health recommendations were provided to patient.     Ashok Pall, LPN   1/61/0960

## 2022-05-27 DIAGNOSIS — D225 Melanocytic nevi of trunk: Secondary | ICD-10-CM | POA: Diagnosis not present

## 2022-05-27 DIAGNOSIS — Z85828 Personal history of other malignant neoplasm of skin: Secondary | ICD-10-CM | POA: Diagnosis not present

## 2022-05-27 DIAGNOSIS — D2262 Melanocytic nevi of left upper limb, including shoulder: Secondary | ICD-10-CM | POA: Diagnosis not present

## 2022-05-27 DIAGNOSIS — L538 Other specified erythematous conditions: Secondary | ICD-10-CM | POA: Diagnosis not present

## 2022-05-27 DIAGNOSIS — L820 Inflamed seborrheic keratosis: Secondary | ICD-10-CM | POA: Diagnosis not present

## 2022-05-27 DIAGNOSIS — D2261 Melanocytic nevi of right upper limb, including shoulder: Secondary | ICD-10-CM | POA: Diagnosis not present

## 2022-06-29 ENCOUNTER — Ambulatory Visit
Admission: RE | Admit: 2022-06-29 | Discharge: 2022-06-29 | Disposition: A | Discharge status: Home / Self Care | Payer: Medicare HMO | Source: Ambulatory Visit | Attending: Urology | Admitting: Urology

## 2022-06-29 ENCOUNTER — Ambulatory Visit
Admission: RE | Admit: 2022-06-29 | Discharge: 2022-06-29 | Disposition: A | Discharge status: Home / Self Care | Payer: Medicare HMO | Attending: Urology | Admitting: Urology

## 2022-06-29 DIAGNOSIS — N200 Calculus of kidney: Secondary | ICD-10-CM

## 2022-06-29 DIAGNOSIS — I878 Other specified disorders of veins: Secondary | ICD-10-CM | POA: Diagnosis not present

## 2022-06-30 ENCOUNTER — Encounter: Payer: Self-pay | Admitting: Urology

## 2022-06-30 ENCOUNTER — Ambulatory Visit: Payer: Medicare HMO | Admitting: Urology

## 2022-06-30 VITALS — BP 139/77 | HR 78 | Ht 67.0 in | Wt 142.0 lb

## 2022-06-30 DIAGNOSIS — N200 Calculus of kidney: Secondary | ICD-10-CM

## 2022-06-30 DIAGNOSIS — R310 Gross hematuria: Secondary | ICD-10-CM | POA: Diagnosis not present

## 2022-06-30 DIAGNOSIS — Z87898 Personal history of other specified conditions: Secondary | ICD-10-CM

## 2022-06-30 DIAGNOSIS — R600 Localized edema: Secondary | ICD-10-CM

## 2022-06-30 LAB — URINALYSIS, COMPLETE
Bilirubin, UA: NEGATIVE
Glucose, UA: NEGATIVE
Ketones, UA: NEGATIVE
Leukocytes,UA: NEGATIVE
Nitrite, UA: NEGATIVE
Protein,UA: NEGATIVE
Specific Gravity, UA: 1.02 (ref 1.005–1.030)
Urobilinogen, Ur: 0.2 mg/dL (ref 0.2–1.0)
pH, UA: 5 (ref 5.0–7.5)

## 2022-06-30 LAB — MICROSCOPIC EXAMINATION
Bacteria, UA: NONE SEEN
Epithelial Cells (non renal): NONE SEEN /hpf (ref 0–10)
WBC, UA: NONE SEEN /hpf (ref 0–5)

## 2022-06-30 NOTE — Progress Notes (Unsigned)
06/30/2022 1:19 PM   Lisa Petersen Feb 15, 1936 [id number]  Referring provider: Leone Haven, MD [address] [place],  [place] [address]  Chief Complaint  Patient presents with   Nephrolithiasis    Urologic history: 1.  Gross hematuria Episode total gross painless hematuria September 2022 Cystoscopy unremarkable; CTU 4 mm nonobstructing left lower pole renal calculus and bilateral renal cysts  2.  Left nephrolithiasis Asymptomatic  HPI: 86 y.o. female presents for follow-up visit.  No flank, abdominal or pelvic pain Since last visit did have some slight difficulty voiding and noted a small amount of blood.  She went to an urgent care and was told she would have to go to ED because they could not help her She did not proceed to the ED and symptoms resolved Presently without voiding symptoms or hematuria   PMH: Past Medical History:  Diagnosis Date   Allergy    Anxiety    Colon polyps    COVID-19    01/2022 home test no antivirals taken   Depression    History of Clostridioides difficile colitis 2014   History of endometriosis    History of Graves' disease    History of jaundice as a child    age 33   Hyperlipidemia    Hypothyroidism    Insomnia    OA (osteoarthritis)    Osteopenia    Plantar fasciitis    Rheumatoid arthritis ([place])    Sigmoid diverticulosis 2008   Squamous cell carcinoma in situ 2017   Central chest   UTI (lower urinary tract infection)    Vitamin B 12 deficiency     Surgical History: Past Surgical History:  Procedure Laterality Date   ABDOMINAL HYSTERECTOMY  1975   Bilateral oophorectomy   CATARACT EXTRACTION Bilateral 2009   COLECTOMY Right 2006   COLONOSCOPY  2015   KNEE ARTHROSCOPY  2011   after knee replacement   REPLACEMENT TOTAL KNEE Right 2011   SKIN CANCER EXCISION  2017   TONSILLECTOMY AND ADENOIDECTOMY  1950   TOTAL KNEE ARTHROPLASTY Left 12/10/2019   Procedure: TOTAL KNEE ARTHROPLASTY;  Surgeon:  Vickey Huger, MD;  Location: [facility];  Service: Orthopedics;  Laterality: Left;  75 mins needed for length of case    Home Medications:  Allergies as of 06/30/2022       Reactions   Amoxicillin-pot Clavulanate Diarrhea   Etodolac Other (See Comments)   May have raised transaminases   Morphine And Related Nausea And Vomiting   Oxycontin [oxycodone Hcl] Nausea Only   Cephalexin Rash   Metronidazole Nausea Only, Other (See Comments)   dyspepsia   Sulfa Antibiotics Rash        Medication List        Accurate as of June 30, 2022  1:19 PM. If you have any questions, ask your nurse or doctor.          amoxicillin 500 MG capsule Commonly known as: AMOXIL   CALTRATE 600+D PO Take 2 tablets by mouth daily.   PRESERVISION AREDS 2+MULTI VIT PO Take 1 capsule by mouth 2 (two) times daily.   CENTRUM SILVER PO Take 1 tablet by mouth daily.   levothyroxine 100 MCG tablet Commonly known as: SYNTHROID Take 1 tablet (100 mcg total) by mouth daily before breakfast.   OSTEO BI-FLEX JOINT SHIELD PO Take 1 tablet by mouth 2 (two) times daily.   traZODone 50 MG tablet Commonly known as: DESYREL TAKE 1 TABLET AT BEDTIME ASNEEDED  FOR SLEEP   VITAMIN B-12 PO Take 2,500 mcg by mouth daily.        Allergies:  Allergies  Allergen Reactions   Amoxicillin-Pot Clavulanate Diarrhea   Etodolac Other (See Comments)    May have raised transaminases     Morphine And Related Nausea And Vomiting   Oxycontin [Oxycodone Hcl] Nausea Only   Cephalexin Rash   Metronidazole Nausea Only and Other (See Comments)    dyspepsia    Sulfa Antibiotics Rash    Family History: Family History  Problem Relation Age of Onset   Hypertension Mother    Hypertension Sister    Mental illness Sister    Dementia Sister    Diabetes Brother    Cancer Brother        Kidney/Bladder   Dementia Father    Parkinson's disease Brother    Breast cancer Neg Hx     Social History:  reports that she has  never smoked. She has never used smokeless tobacco. She reports that she does not drink alcohol and does not use drugs.   Physical Exam: BP 139/77   Pulse 78   Ht '5\' 7"'$  (1.702 m)   Wt 142 lb (64.4 kg)   BMI 22.24 kg/m   Constitutional:  Alert and oriented, No acute distress. HEENT: [place] AT Respiratory: Normal respiratory effort, no increased work of breathing. Psychiatric: Normal mood and affect.  Laboratory Data:  Urinalysis Microscopy negative   Pertinent Imaging: Images of a KUB performed yesterday were personally reviewed and interpreted.  Large amount of stool and bowel gas overlying the renal outlines though does appear to be stable left lower pole renal calculus  Abdomen 1 view (KUB)  Narrative CLINICAL DATA:  History of nephrolithiasis.  EXAM: ABDOMEN - 1 VIEW  COMPARISON:  June 27, 2007.  December 25, 2021.  FINDINGS: The bowel gas pattern is normal. Left renal calculus is noted which is unchanged compared to prior CT scan. Phleboliths are noted in the pelvis.  IMPRESSION: Left renal calculus.  No abnormal bowel dilatation.   Electronically Signed By: Marijo Conception M.D. On: 06/30/2022 13:11    Assessment & Plan:    1.  Left nephrolithiasis Asymptomatic left renal calculus Follow-up 1 year with KUB  2.  History gross hematuria UA today negative Recommend follow-up here for recurrent gross hematuria  3.  Lower extremity edema She is scheduled for air/bus travel next week and states she develops lower extremity edema.  She has taken low-dose Lasix and requested an Rx.  Record review with normal serum potassium.  Limited Rx sent   Abbie Sons, [facility] [address] [place], [place] [address] [phone]

## 2022-07-01 ENCOUNTER — Encounter: Payer: Self-pay | Admitting: Urology

## 2022-07-01 MED ORDER — FUROSEMIDE 20 MG PO TABS: ORAL_TABLET | ORAL | 0 refills | Status: DC

## 2022-07-04 ENCOUNTER — Other Ambulatory Visit: Payer: Self-pay | Admitting: Family Medicine

## 2022-07-05 ENCOUNTER — Encounter: Payer: Self-pay | Admitting: Family Medicine

## 2022-07-05 ENCOUNTER — Ambulatory Visit (INDEPENDENT_AMBULATORY_CARE_PROVIDER_SITE_OTHER): Payer: Medicare HMO | Admitting: Family Medicine

## 2022-07-05 VITALS — BP 120/70 | HR 70 | Temp 98.4°F | Ht 67.0 in | Wt 144.6 lb

## 2022-07-05 DIAGNOSIS — E039 Hypothyroidism, unspecified: Secondary | ICD-10-CM

## 2022-07-05 DIAGNOSIS — Z780 Asymptomatic menopausal state: Secondary | ICD-10-CM | POA: Diagnosis not present

## 2022-07-05 DIAGNOSIS — Z0001 Encounter for general adult medical examination with abnormal findings: Secondary | ICD-10-CM | POA: Diagnosis not present

## 2022-07-05 DIAGNOSIS — Z130 Encounter for screening for diseases of the blood and blood-forming organs and certain disorders involving the immune mechanism: Secondary | ICD-10-CM | POA: Diagnosis not present

## 2022-07-05 DIAGNOSIS — H9313 Tinnitus, bilateral: Secondary | ICD-10-CM

## 2022-07-05 DIAGNOSIS — R310 Gross hematuria: Secondary | ICD-10-CM

## 2022-07-05 DIAGNOSIS — Z13220 Encounter for screening for lipoid disorders: Secondary | ICD-10-CM | POA: Diagnosis not present

## 2022-07-05 LAB — COMPREHENSIVE METABOLIC PANEL
ALT: 12 U/L (ref 0–35)
AST: 17 U/L (ref 0–37)
Albumin: 4.2 g/dL (ref 3.5–5.2)
Alkaline Phosphatase: 44 U/L (ref 39–117)
BUN: 15 mg/dL (ref 6–23)
CO2: 30 mEq/L (ref 19–32)
Calcium: 9 mg/dL (ref 8.4–10.5)
Chloride: 104 mEq/L (ref 96–112)
Creatinine, Ser: 0.92 mg/dL (ref 0.40–1.20)
GFR: 57.4 mL/min — ABNORMAL LOW (ref >60.00)
Glucose, Bld: 95 mg/dL (ref 70–99)
Potassium: 4.6 mEq/L (ref 3.5–5.1)
Sodium: 141 mEq/L (ref 135–145)
Total Bilirubin: 0.4 mg/dL (ref 0.2–1.2)
Total Protein: 6.3 g/dL (ref 6.0–8.3)

## 2022-07-05 LAB — CBC
HCT: 38.4 % (ref 36.0–46.0)
Hemoglobin: 13 g/dL (ref 12.0–15.0)
MCHC: 33.8 g/dL (ref 30.0–36.0)
MCV: 90.9 fl (ref 78.0–100.0)
Platelets: 198 10*3/uL (ref 150.0–400.0)
RBC: 4.22 Mil/uL (ref 3.87–5.11)
RDW: 13.4 % (ref 11.5–15.5)
WBC: 4.4 10*3/uL (ref 4.0–10.5)

## 2022-07-05 LAB — LIPID PANEL
Cholesterol: 184 mg/dL (ref 0–200)
HDL: 49.6 mg/dL (ref >39.00)
LDL Cholesterol: 111 mg/dL — ABNORMAL HIGH (ref 0–99)
NonHDL: 134.35
Total CHOL/HDL Ratio: 4
Triglycerides: 119 mg/dL (ref 0.0–149.0)
VLDL: 23.8 mg/dL (ref 0.0–40.0)

## 2022-07-05 LAB — T4, FREE: Free T4: 0.8 ng/dL (ref 0.60–1.60)

## 2022-07-05 LAB — TSH: TSH: 0.74 u[IU]/mL (ref 0.35–5.50)

## 2022-07-05 MED ORDER — TETANUS-DIPHTHERIA TOXOIDS TD 5-2 LFU IM INJ: 0.5000 mL | INJECTION | Freq: Once | INTRAMUSCULAR | 0 refills | Status: AC

## 2022-07-05 NOTE — Patient Instructions (Signed)
Nice to see you. We will get lab work today and contact you with results. Please go to your pharmacy and get your tetanus vaccine. Please call [phone] to schedule your bone density scan for sometime after August 11.

## 2022-07-05 NOTE — Assessment & Plan Note (Signed)
No recurrence.  Likely related to kidney stone.  She will continue to follow with urology.

## 2022-07-05 NOTE — Assessment & Plan Note (Signed)
Physical exam completed.  Encouraged continued healthy diet and exercise.  Tetanus vaccine sent to the pharmacy.  She was advised to get the updated COVID booster.  Cancer screening is up-to-date.  Bone density scan ordered and provided the number for her to call to schedule this.  Lab work as outlined.

## 2022-07-05 NOTE — Progress Notes (Signed)
Tommi Rumps, MD Phone: [phone]  Lisa Petersen is a 86 y.o. female who presents today for CPE.  Diet: Generally healthy, has toast for breakfast.  Has a sandwich for lunch.  Has a variety of things for dinner.  Not much meat.  That she drinks lots of water.  Does have diet soda daily. Exercise: Doing exercise classes 3-4 times a week.  Does cardio, stretching, and weights Pap smear: Aged out, status post hysterectomy Colonoscopy: Aged out Mammogram: 03/30/2022 negative Family history-  Colon cancer: No  Breast cancer: No  Ovarian cancer: no Menses: Status post hysterectomy Vaccines-   Flu: Out of season  Tetanus: Due  Shingles: Up-to-date  COVID19: X4, due for updated booster  Pneumonia: Up-to-date HIV screening: Aged out Hep C Screening: Not indicated given age Tobacco use: no Alcohol use: no Illicit Drug use: no Dentist: yes Ophthalmology: yes  Hematuria: The patient had some issues last year with hematuria.  She was treated for UTI on a couple of occasions though did not have a positive urine culture.  She subsequently had a CT renal stone study that revealed a left renal calculus.  She initiated follow-up with urology on her own and had a negative cystoscopy and CT hematuria work-up with left renal calculus.  She has been following with urology since then.  History of COVID-19: Patient had COVID-19 earlier this year.  She notes she is recovered well.  She was upset that she was advised to go to the walk-in clinic at the time of this illness.   Active Ambulatory Problems    Diagnosis Date Noted  . Hypothyroidism 11/14/2014  . Insomnia 06/17/2015  . History of squamous cell carcinoma of skin 04/07/2016  . Anxiety and depression 05/21/2016  . Encounter for general adult medical examination with abnormal findings 10/07/2016  . Bilateral finger numbness 04/06/2017  . Osteoarthritis 04/06/2017  . Axillary fullness 07/19/2019  . Urinary symptom or sign 05/12/2021   . Tinnitus of both ears 06/29/2021  . Acute cystitis with hematuria 10/23/2021  . Hematuria 10/23/2021   Resolved Ambulatory Problems    Diagnosis Date Noted  . Protracted upper respiratory infection 11/14/2014  . Right lower quadrant abdominal mass 11/14/2014  . Well woman exam 06/12/2015  . Medicare annual wellness visit, subsequent 06/17/2015  . Grief 06/17/2015  . Dizziness and giddiness 11/12/2015  . Cerumen impaction 11/12/2015  . UTI (urinary tract infection) 05/21/2016  . Sinusitis 03/09/2018  . Borderline blood pressure 03/09/2018  . Urinary tract infection without hematuria 10/17/2019  . Respiratory illness 01/12/2021   Past Medical History:  Diagnosis Date  . Allergy   . Anxiety   . Colon polyps   . COVID-19   . Depression   . History of Clostridioides difficile colitis 2014  . History of endometriosis   . History of Graves' disease   . History of jaundice as a child   . Hyperlipidemia   . OA (osteoarthritis)   . Osteopenia   . Plantar fasciitis   . Rheumatoid arthritis ([place])   . Sigmoid diverticulosis 2008  . Squamous cell carcinoma in situ 2017  . UTI (lower urinary tract infection)   . Vitamin B 12 deficiency     Family History  Problem Relation Age of Onset  . Hypertension Mother   . Hypertension Sister   . Mental illness Sister   . Dementia Sister   . Diabetes Brother   . Cancer Brother        Kidney/Bladder  . Dementia  Father   . Parkinson's disease Brother   . Breast cancer Neg Hx     Social History   Socioeconomic History  . Marital status: Widowed    Spouse name: Not on file  . Number of children: Not on file  . Years of education: Not on file  . Highest education level: Not on file  Occupational History  . Not on file  Tobacco Use  . Smoking status: Never  . Smokeless tobacco: Never  Vaping Use  . Vaping Use: Never used  Substance and Sexual Activity  . Alcohol use: No  . Drug use: No  . Sexual activity: Yes    Birth  control/protection: None  Other Topics Concern  . Not on file  Social History Narrative   Has a living will scanned into chart.   Social Determinants of Health   Financial Resource Strain: Low Risk  (04/20/2022)   Overall Financial Resource Strain (CARDIA)   . Difficulty of Paying Living Expenses: Not hard at all  Food Insecurity: No Food Insecurity (04/20/2022)   Hunger Vital Sign   . Worried About Charity fundraiser in the Last Year: Never true   . Ran Out of Food in the Last Year: Never true  Transportation Needs: No Transportation Needs (04/20/2022)   PRAPARE - Transportation   . Lack of Transportation (Medical): No   . Lack of Transportation (Non-Medical): No  Physical Activity: Unknown (08/31/2018)   Exercise Vital Sign   . Days of Exercise per Week: 0 days   . Minutes of Exercise per Session: Not on file  Stress: No Stress Concern Present (04/20/2022)   [place]   . Feeling of Stress : Not at all  Social Connections: Unknown (04/20/2022)   Social Connection and Isolation Panel [NHANES]   . Frequency of Communication with Friends and Family: More than three times a week   . Frequency of Social Gatherings with Friends and Family: More than three times a week   . Attends Religious Services: More than 4 times per year   . Active Member of Clubs or Organizations: Not on file   . Attends Archivist Meetings: Not on file   . Marital Status: Not on file  Intimate Partner Violence: Not At Risk (04/20/2022)   Humiliation, Afraid, Rape, and Kick questionnaire   . Fear of Current or Ex-Partner: No   . Emotionally Abused: No   . Physically Abused: No   . Sexually Abused: No    ROS  General:  Negative for nexplained weight loss, fever Skin: Negative for new or changing mole, sore that won't heal HEENT: Negative for trouble hearing, trouble seeing, ringing in ears, mouth sores, hoarseness, change in voice,  dysphagia. CV:  Negative for chest pain, dyspnea, edema, palpitations Resp: Negative for cough, dyspnea, hemoptysis GI: Negative for nausea, vomiting, diarrhea, constipation, abdominal pain, melena, hematochezia. GU: Negative for dysuria, incontinence, urinary hesitance, hematuria, vaginal or penile discharge, polyuria, sexual difficulty, lumps in testicle or breasts MSK: Negative for muscle cramps or aches, joint pain or swelling Neuro: Negative for headaches, weakness, numbness, dizziness, passing out/fainting Psych: Negative for depression, anxiety, memory problems  Objective  Physical Exam Vitals:   07/05/22 0907  BP: 120/70  Pulse: 70  Temp: 98.4 F (36.9 C)  SpO2: 98%    BP Readings from Last 3 Encounters:  07/05/22 120/70  06/30/22 139/77  12/04/21 116/78   Wt Readings from Last 3 Encounters:  07/05/22 144 lb 9.6 oz (65.6 kg)  06/30/22 142 lb (64.4 kg)  04/20/22 140 lb (63.5 kg)    Physical Exam Constitutional:      General: She is not in acute distress.    Appearance: She is not diaphoretic.  Cardiovascular:     Rate and Rhythm: Normal rate and regular rhythm.     Heart sounds: Normal heart sounds.  Pulmonary:     Effort: Pulmonary effort is normal.     Breath sounds: Normal breath sounds.  Abdominal:     General: Bowel sounds are normal. There is no distension.     Palpations: Abdomen is soft.     Tenderness: There is no abdominal tenderness. There is no guarding or rebound.  Musculoskeletal:     Right lower leg: No edema.     Left lower leg: No edema.  Lymphadenopathy:     Cervical: No cervical adenopathy.  Skin:    General: Skin is warm and dry.  Neurological:     Mental Status: She is alert.  Psychiatric:        Mood and Affect: Mood normal.     Assessment/Plan:   Problem List Items Addressed This Visit     Encounter for general adult medical examination with abnormal findings - Primary    Physical exam completed.  Encouraged continued  healthy diet and exercise.  Tetanus vaccine sent to the pharmacy.  She was advised to get the updated COVID booster.  Cancer screening is up-to-date.  Bone density scan ordered and provided the number for her to call to schedule this.  Lab work as outlined.      Hematuria    No recurrence.  Likely related to kidney stone.  She will continue to follow with urology.      Hypothyroidism   Relevant Orders   TSH   T4, free   Tinnitus of both ears    At the end of the visit the patient reports tinnitus occurring after having COVID.  She notes it was not noticeable prior to having COVID.  She notes it is bilateral.  She was advised to monitor and if it becomes unilateral or becomes worse she will let us know.      Other Visit Diagnoses     Lipid screening       Relevant Orders   Comp Met (CMET)   Lipid panel   Screening for deficiency anemia       Relevant Orders   CBC   Postmenopausal estrogen deficiency       Relevant Orders   DG Bone Density       Return in about 1 year (around 07/06/2023) for CPE.  On chart review it appears the patient had COVID back in February.  It looks like she was transferred to the triage service and advised to see the walk-in clinic potentially.  The day after she called my CMA contacted her to see if she needed a virtual visit to be seen though she deferred at that time as she was improved.  Its not clear if any of these messages were sent directly to me to review.  Tommi Rumps, MD [place]

## 2022-07-05 NOTE — Assessment & Plan Note (Signed)
At the end of the visit the patient reports tinnitus occurring after having COVID.  She notes it was not noticeable prior to having COVID.  She notes it is bilateral.  She was advised to monitor and if it becomes unilateral or becomes worse she will let us know.

## 2022-08-25 ENCOUNTER — Other Ambulatory Visit: Payer: Medicare HMO

## 2022-09-16 DIAGNOSIS — H26493 Other secondary cataract, bilateral: Secondary | ICD-10-CM | POA: Diagnosis not present

## 2022-09-16 DIAGNOSIS — Z9842 Cataract extraction status, left eye: Secondary | ICD-10-CM | POA: Diagnosis not present

## 2022-09-16 DIAGNOSIS — Z9841 Cataract extraction status, right eye: Secondary | ICD-10-CM | POA: Diagnosis not present

## 2022-09-16 DIAGNOSIS — H52223 Regular astigmatism, bilateral: Secondary | ICD-10-CM | POA: Diagnosis not present

## 2022-09-16 DIAGNOSIS — H5203 Hypermetropia, bilateral: Secondary | ICD-10-CM | POA: Diagnosis not present

## 2022-09-16 DIAGNOSIS — H353132 Nonexudative age-related macular degeneration, bilateral, intermediate dry stage: Secondary | ICD-10-CM | POA: Diagnosis not present

## 2022-09-16 LAB — HM DIABETES EYE EXAM

## 2022-09-21 ENCOUNTER — Ambulatory Visit
Admission: RE | Admit: 2022-09-21 | Discharge: 2022-09-21 | Disposition: A | Discharge status: Home / Self Care | Payer: Medicare HMO | Source: Ambulatory Visit | Attending: Family Medicine | Admitting: Family Medicine

## 2022-09-21 DIAGNOSIS — Z780 Asymptomatic menopausal state: Secondary | ICD-10-CM | POA: Insufficient documentation

## 2022-09-21 DIAGNOSIS — M8589 Other specified disorders of bone density and structure, multiple sites: Secondary | ICD-10-CM | POA: Diagnosis not present

## 2022-10-08 ENCOUNTER — Ambulatory Visit (INDEPENDENT_AMBULATORY_CARE_PROVIDER_SITE_OTHER): Payer: Medicare HMO | Admitting: Family Medicine

## 2022-10-08 ENCOUNTER — Encounter: Payer: Self-pay | Admitting: Family Medicine

## 2022-10-08 VITALS — BP 128/80 | HR 57 | Temp 98.5°F | Ht 67.0 in | Wt 144.0 lb

## 2022-10-08 DIAGNOSIS — E039 Hypothyroidism, unspecified: Secondary | ICD-10-CM

## 2022-10-08 DIAGNOSIS — M810 Age-related osteoporosis without current pathological fracture: Secondary | ICD-10-CM | POA: Diagnosis not present

## 2022-10-08 MED ORDER — LEVOTHYROXINE SODIUM 100 MCG PO TABS: 100.0000 ug | ORAL_TABLET | Freq: Every day | ORAL | 1 refills | Status: DC

## 2022-10-08 MED ORDER — TRAZODONE HCL 50 MG PO TABS: ORAL_TABLET | ORAL | 1 refills | Status: DC

## 2022-10-08 NOTE — Assessment & Plan Note (Signed)
Discussed bone density scan results and FRAX scoring.  Discussed she meets criteria for osteoporosis based on FRAX score.  Discussed elevated risk of fracture.  Discussed Fosamax as my first line for treatment in her.  Discussed potential issues if she had history of esophageal ulcer or swallowing issues.  Discussed potential for aching.  She was hesitant to start on medicine for this.  I gave her information on Fosamax and she will let me know if she changes her mind.  We will check a vitamin D in about a month.

## 2022-10-08 NOTE — Progress Notes (Signed)
  Tommi Rumps, MD Phone: [phone]  Lisa Petersen is a 86 y.o. female who presents today for f/u.  Osteoporosis: Patient had a recent bone density scan with osteopenic T score though her FRAX score indicated osteoporosis.  She is started on vitamin D 5000 international units once daily last week.  She has no history of fracture.  No history of esophagus or stomach ulcers.  Patient notes she is due for a renewal of her handicap placard soon.  There are times where she cannot walk more than 200 feet without stopping to rest.  Social History   Tobacco Use  Smoking Status Never  Smokeless Tobacco Never    Current Outpatient Medications on File Prior to Visit  Medication Sig Dispense Refill   amoxicillin (AMOXIL) 500 MG capsule      Calcium Carbonate-Vitamin D (CALTRATE 600+D PO) Take 2 tablets by mouth daily.      Cyanocobalamin (VITAMIN B-12 PO) Take 2,500 mcg by mouth daily.      furosemide (LASIX) 20 MG tablet 0.5-1 tab daily as needed for lower extremity edema 7 tablet 0   Misc Natural Products (OSTEO BI-FLEX JOINT SHIELD PO) Take 1 tablet by mouth 2 (two) times daily.      Multiple Vitamins-Minerals (CENTRUM SILVER PO) Take 1 tablet by mouth daily.      Multiple Vitamins-Minerals (PRESERVISION AREDS 2+MULTI VIT PO) Take 1 capsule by mouth 2 (two) times daily.     No current facility-administered medications on file prior to visit.     ROS see history of present illness  Objective  Physical Exam Vitals:   10/08/22 1629  BP: 128/80  Pulse: (!) 57  Temp: 98.5 F (36.9 C)  SpO2: 94%    BP Readings from Last 3 Encounters:  10/08/22 128/80  07/05/22 120/70  06/30/22 139/77   Wt Readings from Last 3 Encounters:  10/08/22 144 lb (65.3 kg)  07/05/22 144 lb 9.6 oz (65.6 kg)  06/30/22 142 lb (64.4 kg)    Physical Exam Constitutional:      General: She is not in acute distress. Neurological:     Mental Status: She is alert.      Assessment/Plan: Please  see individual problem list.  Problem List Items Addressed This Visit     Age-related osteoporosis without current pathological fracture - Primary    Discussed bone density scan results and FRAX scoring.  Discussed she meets criteria for osteoporosis based on FRAX score.  Discussed elevated risk of fracture.  Discussed Fosamax as my first line for treatment in her.  Discussed potential issues if she had history of esophageal ulcer or swallowing issues.  Discussed potential for aching.  She was hesitant to start on medicine for this.  I gave her information on Fosamax and she will let me know if she changes her mind.  We will check a vitamin D in about a month.      Relevant Medications   levothyroxine (SYNTHROID) 100 MCG tablet   Other Relevant Orders   Vitamin D (25 hydroxy)   Hypothyroidism   Relevant Medications   levothyroxine (SYNTHROID) 100 MCG tablet   Other Relevant Orders   TSH   She will contact me closer to the time that she is due for her handicap placard.  Return in about 1 month (around 11/07/2022) for labs.   Tommi Rumps, MD [place]

## 2022-11-08 ENCOUNTER — Other Ambulatory Visit (INDEPENDENT_AMBULATORY_CARE_PROVIDER_SITE_OTHER): Payer: Medicare HMO

## 2022-11-08 DIAGNOSIS — E039 Hypothyroidism, unspecified: Secondary | ICD-10-CM | POA: Diagnosis not present

## 2022-11-08 DIAGNOSIS — M810 Age-related osteoporosis without current pathological fracture: Secondary | ICD-10-CM

## 2022-11-09 LAB — TSH: TSH: 0.5 u[IU]/mL (ref 0.35–5.50)

## 2022-11-09 LAB — VITAMIN D 25 HYDROXY (VIT D DEFICIENCY, FRACTURES): VITD: 84.73 ng/mL (ref 30.00–100.00)

## 2022-11-24 DIAGNOSIS — M25512 Pain in left shoulder: Secondary | ICD-10-CM | POA: Diagnosis not present

## 2023-01-07 ENCOUNTER — Ambulatory Visit: Payer: Medicare HMO | Admitting: Family Medicine

## 2023-01-10 ENCOUNTER — Ambulatory Visit (INDEPENDENT_AMBULATORY_CARE_PROVIDER_SITE_OTHER): Payer: Medicare HMO | Admitting: Family Medicine

## 2023-01-10 ENCOUNTER — Encounter: Payer: Self-pay | Admitting: Family Medicine

## 2023-01-10 DIAGNOSIS — G4700 Insomnia, unspecified: Secondary | ICD-10-CM

## 2023-01-10 DIAGNOSIS — E039 Hypothyroidism, unspecified: Secondary | ICD-10-CM | POA: Diagnosis not present

## 2023-01-10 DIAGNOSIS — R002 Palpitations: Secondary | ICD-10-CM | POA: Insufficient documentation

## 2023-01-10 LAB — COMPREHENSIVE METABOLIC PANEL
ALT: 12 U/L (ref 0–35)
AST: 15 U/L (ref 0–37)
Albumin: 4.3 g/dL (ref 3.5–5.2)
Alkaline Phosphatase: 48 U/L (ref 39–117)
BUN: 17 mg/dL (ref 6–23)
CO2: 28 mEq/L (ref 19–32)
Calcium: 9.6 mg/dL (ref 8.4–10.5)
Chloride: 103 mEq/L (ref 96–112)
Creatinine, Ser: 0.91 mg/dL (ref 0.40–1.20)
GFR: 57.95 mL/min — ABNORMAL LOW (ref >60.00)
Glucose, Bld: 84 mg/dL (ref 70–99)
Potassium: 4.2 mEq/L (ref 3.5–5.1)
Sodium: 139 mEq/L (ref 135–145)
Total Bilirubin: 0.4 mg/dL (ref 0.2–1.2)
Total Protein: 6.5 g/dL (ref 6.0–8.3)

## 2023-01-10 LAB — T4, FREE: Free T4: 0.95 ng/dL (ref 0.60–1.60)

## 2023-01-10 LAB — TSH: TSH: 0.75 u[IU]/mL (ref 0.35–5.50)

## 2023-01-10 NOTE — Assessment & Plan Note (Signed)
Continue synthroid 100 mcg daily. Check TSH and free T4.

## 2023-01-10 NOTE — Progress Notes (Signed)
Tommi Rumps, MD Phone: [phone]  Lisa Petersen is a 87 y.o. female who presents today for f/u.  HYPOTHYROIDISM Disease Monitoring Weight changes: no  Skin Changes: no Palpitations: yes Heat/Cold intolerance: notes cold intolerance  Medication Monitoring Compliance:  synthroid   Last TSH:   Lab Results  Component Value Date   TSH 0.50 11/08/2022   Palpitations: Patient notes onset of symptoms a month or so ago.  She has had palpitations and some dyspnea at times when she is exerting herself.  She notes when she rests it calms down quickly.  She has also noted fatigue.  No chest pain.  She notes this is how she felt previously when her thyroid was off.  Insomnia: Patient has been taking trazodone nightly.  Over the last few weeks she has been waking up around 3 AM and sometimes going back to sleep and other times not.  She notes no depression or anxiety.  She typically goes to bed at 11 PM and wakes up between 6:30 AM and 7 AM.  She is not drowsy with the trazodone.  She wonders about trying to come off of the trazodone.  Social History   Tobacco Use  Smoking Status Never  Smokeless Tobacco Never    Current Outpatient Medications on File Prior to Visit  Medication Sig Dispense Refill   amoxicillin (AMOXIL) 500 MG capsule      Calcium Carbonate-Vitamin D (CALTRATE 600+D PO) Take 2 tablets by mouth daily.      Cyanocobalamin (VITAMIN B-12 PO) Take 2,500 mcg by mouth daily.      furosemide (LASIX) 20 MG tablet 0.5-1 tab daily as needed for lower extremity edema 7 tablet 0   levothyroxine (SYNTHROID) 100 MCG tablet Take 1 tablet (100 mcg total) by mouth daily before breakfast. 90 tablet 1   Misc Natural Products (OSTEO BI-FLEX JOINT SHIELD PO) Take 1 tablet by mouth 2 (two) times daily.      Multiple Vitamins-Minerals (CENTRUM SILVER PO) Take 1 tablet by mouth daily.      Multiple Vitamins-Minerals (PRESERVISION AREDS 2+MULTI VIT PO) Take 1 capsule by mouth 2 (two) times  daily.     traZODone (DESYREL) 50 MG tablet TAKE 1 TABLET AT BEDTIME ASNEEDED FOR SLEEP 90 tablet 1   No current facility-administered medications on file prior to visit.     ROS see history of present illness  Objective  Physical Exam Vitals:   01/10/23 1052  BP: 128/78  Pulse: (!) 59  Temp: 98.6 F (37 C)  SpO2: 99%    BP Readings from Last 3 Encounters:  01/10/23 128/78  10/08/22 128/80  07/05/22 120/70   Wt Readings from Last 3 Encounters:  01/10/23 145 lb (65.8 kg)  10/08/22 144 lb (65.3 kg)  07/05/22 144 lb 9.6 oz (65.6 kg)    Physical Exam Constitutional:      General: She is not in acute distress.    Appearance: She is not diaphoretic.  Cardiovascular:     Rate and Rhythm: Normal rate and regular rhythm.     Heart sounds: Normal heart sounds.  Pulmonary:     Effort: Pulmonary effort is normal.     Breath sounds: Normal breath sounds.  Skin:    General: Skin is warm and dry.  Neurological:     Mental Status: She is alert.      Assessment/Plan: Please see individual problem list.  Palpitations Assessment & Plan: Palpitations could be from a primary cardiac cause or from her thyroid  medication.  We will check an EKG today and check lab work to determine the neck step in management.  Orders: -     EKG 12-Lead -     TSH -     T4, free -     Comprehensive metabolic panel -     CBC  Hypothyroidism, unspecified type Assessment & Plan: Continue synthroid 100 mcg daily. Check TSH and free T4.  Orders: -     TSH -     T4, free  Insomnia, unspecified type Assessment & Plan: Patient can try to take the trazodone 50 mg every other day to see if she is able to come off of it.      Return in about 6 months (around 07/11/2023) for physical.   Tommi Rumps, MD [place]

## 2023-01-10 NOTE — Assessment & Plan Note (Signed)
Patient can try to take the trazodone 50 mg every other day to see if she is able to come off of it.

## 2023-01-10 NOTE — Patient Instructions (Signed)
Nice to see you. We will get some lab work today and contact you with the results.

## 2023-01-10 NOTE — Assessment & Plan Note (Signed)
Palpitations could be from a primary cardiac cause or from her thyroid medication.  We will check an EKG today and check lab work to determine the neck step in management.

## 2023-01-11 NOTE — Addendum Note (Signed)
Addended by: Neta Ehlers on: 01/11/2023 02:31 PM   Modules accepted: Orders

## 2023-01-12 ENCOUNTER — Other Ambulatory Visit: Payer: Self-pay | Admitting: Family Medicine

## 2023-01-12 DIAGNOSIS — R0609 Other forms of dyspnea: Secondary | ICD-10-CM

## 2023-01-12 DIAGNOSIS — M25512 Pain in left shoulder: Secondary | ICD-10-CM | POA: Diagnosis not present

## 2023-01-12 DIAGNOSIS — R002 Palpitations: Secondary | ICD-10-CM

## 2023-01-12 LAB — CBC
HCT: 38.9 % (ref 36.0–46.0)
Hemoglobin: 13 g/dL (ref 12.0–15.0)
MCHC: 33.5 g/dL (ref 30.0–36.0)
MCV: 91.3 fl (ref 78.0–100.0)
Platelets: 246 10*3/uL (ref 150.0–400.0)
RBC: 4.26 Mil/uL (ref 3.87–5.11)
RDW: 13.1 % (ref 11.5–15.5)
WBC: 4.7 10*3/uL (ref 4.0–10.5)

## 2023-01-25 ENCOUNTER — Ambulatory Visit: Payer: Medicare HMO | Admitting: Family Medicine

## 2023-01-25 ENCOUNTER — Telehealth: Payer: Self-pay | Admitting: Family Medicine

## 2023-01-25 NOTE — Telephone Encounter (Signed)
Patient dropped off document to be filled out by provider Handicap Placard. Patient requested to send it via Call Patient to pick up within 5-days. Document is located in providers color folder at front office.

## 2023-01-25 NOTE — Telephone Encounter (Signed)
Handicap paperwork placed in Dr. Ellen Henri to be signed folder

## 2023-01-26 NOTE — Telephone Encounter (Signed)
Signed. She previously noted that she could not walk more than 200 feet without resting at times.

## 2023-03-25 ENCOUNTER — Other Ambulatory Visit: Payer: Self-pay | Admitting: Family Medicine

## 2023-03-25 DIAGNOSIS — Z1231 Encounter for screening mammogram for malignant neoplasm of breast: Secondary | ICD-10-CM

## 2023-04-05 ENCOUNTER — Telehealth: Payer: Self-pay | Admitting: Family Medicine

## 2023-04-05 NOTE — Telephone Encounter (Signed)
Contacted Lisa Petersen to schedule their annual wellness visit. Call back at later date: 04/11/2023  Patient's out of town.  Thank you,  [facility] Support [facility] Direct dial  [phone]

## 2023-04-11 NOTE — Telephone Encounter (Signed)
Contacted Lisa Petersen to schedule their annual wellness visit. Appointment made for 04/15/2023.   Thank you,  [facility] Support [facility] Direct dial  [phone]

## 2023-04-13 DIAGNOSIS — H3530 Unspecified macular degeneration: Secondary | ICD-10-CM | POA: Diagnosis not present

## 2023-04-13 DIAGNOSIS — E039 Hypothyroidism, unspecified: Secondary | ICD-10-CM | POA: Diagnosis not present

## 2023-04-13 DIAGNOSIS — I10 Essential (primary) hypertension: Secondary | ICD-10-CM | POA: Diagnosis not present

## 2023-04-13 DIAGNOSIS — F325 Major depressive disorder, single episode, in full remission: Secondary | ICD-10-CM | POA: Diagnosis not present

## 2023-04-13 DIAGNOSIS — Z85828 Personal history of other malignant neoplasm of skin: Secondary | ICD-10-CM | POA: Diagnosis not present

## 2023-04-13 DIAGNOSIS — I700 Atherosclerosis of aorta: Secondary | ICD-10-CM | POA: Diagnosis not present

## 2023-04-13 DIAGNOSIS — Z809 Family history of malignant neoplasm, unspecified: Secondary | ICD-10-CM | POA: Diagnosis not present

## 2023-04-13 DIAGNOSIS — R32 Unspecified urinary incontinence: Secondary | ICD-10-CM | POA: Diagnosis not present

## 2023-04-13 DIAGNOSIS — Z8249 Family history of ischemic heart disease and other diseases of the circulatory system: Secondary | ICD-10-CM | POA: Diagnosis not present

## 2023-04-13 DIAGNOSIS — M810 Age-related osteoporosis without current pathological fracture: Secondary | ICD-10-CM | POA: Diagnosis not present

## 2023-04-13 DIAGNOSIS — E785 Hyperlipidemia, unspecified: Secondary | ICD-10-CM | POA: Diagnosis not present

## 2023-04-13 DIAGNOSIS — Z008 Encounter for other general examination: Secondary | ICD-10-CM | POA: Diagnosis not present

## 2023-04-13 DIAGNOSIS — M1990 Unspecified osteoarthritis, unspecified site: Secondary | ICD-10-CM | POA: Diagnosis not present

## 2023-04-15 ENCOUNTER — Ambulatory Visit (INDEPENDENT_AMBULATORY_CARE_PROVIDER_SITE_OTHER): Payer: Medicare HMO

## 2023-04-15 VITALS — Wt 145.0 lb

## 2023-04-15 DIAGNOSIS — Z0000 Encounter for general adult medical examination without abnormal findings: Secondary | ICD-10-CM

## 2023-04-15 NOTE — Patient Instructions (Signed)
Lisa Petersen , Thank you for taking time to come for your Medicare Wellness Visit. I appreciate your ongoing commitment to your health goals. Please review the following plan we discussed and let me know if I can assist you in the future.   These are the goals we discussed:  Goals       Patient Stated     Follow up with Primary Care Provider (pt-stated)      Maintain Healthy Lifestyle.      Other     Increase physical activity        This is a list of the screening recommended for you and due dates:  Health Maintenance  Topic Date Due   DTaP/Tdap/Td vaccine (2 - Tdap) 12/05/2016   COVID-19 Vaccine (6 - 2023-24 season) 09/02/2022   Flu Shot  07/07/2023   Medicare Annual Wellness Visit  04/14/2024   Pneumonia Vaccine  Completed   DEXA scan (bone density measurement)  Completed   Zoster (Shingles) Vaccine  Completed   HPV Vaccine  Aged Out    Advanced directives: Advance directive discussed with you today. I have provided a copy for you to complete at home and have notarized. Once this is complete please bring a copy in to our office so we can scan it into your chart.   Conditions/risks identified: Aim for 30 minutes of exercise or brisk walking, 6-8 glasses of water, and 5 servings of fruits and vegetables each day.   Next appointment: Follow up in one year for your annual wellness visit    Preventive Care 65 Years and Older, Female Preventive care refers to lifestyle choices and visits with your health care provider that can promote health and wellness. What does preventive care include? A yearly physical exam. This is also called an annual well check. Dental exams once or twice a year. Routine eye exams. Ask your health care provider how often you should have your eyes checked. Personal lifestyle choices, including: Daily care of your teeth and gums. Regular physical activity. Eating a healthy diet. Avoiding tobacco and drug use. Limiting alcohol use. Practicing safe  sex. Taking low-dose aspirin every day. Taking vitamin and mineral supplements as recommended by your health care provider. What happens during an annual well check? The services and screenings done by your health care provider during your annual well check will depend on your age, overall health, lifestyle risk factors, and family history of disease. Counseling  Your health care provider may ask you questions about your: Alcohol use. Tobacco use. Drug use. Emotional well-being. Home and relationship well-being. Sexual activity. Eating habits. History of falls. Memory and ability to understand (cognition). Work and work Astronomer. Reproductive health. Screening  You may have the following tests or measurements: Height, weight, and BMI. Blood pressure. Lipid and cholesterol levels. These may be checked every 5 years, or more frequently if you are over 55 years old. Skin check. Lung cancer screening. You may have this screening every year starting at age 28 if you have a 30-pack-year history of smoking and currently smoke or have quit within the past 15 years. Fecal occult blood test (FOBT) of the stool. You may have this test every year starting at age 50. Flexible sigmoidoscopy or colonoscopy. You may have a sigmoidoscopy every 5 years or a colonoscopy every 10 years starting at age 26. Hepatitis C blood test. Hepatitis B blood test. Sexually transmitted disease (STD) testing. Diabetes screening. This is done by checking your blood sugar (glucose) after you  have not eaten for a while (fasting). You may have this done every 1-3 years. Bone density scan. This is done to screen for osteoporosis. You may have this done starting at age 87. Mammogram. This may be done every 1-2 years. Talk to your health care provider about how often you should have regular mammograms. Talk with your health care provider about your test results, treatment options, and if necessary, the need for more  tests. Vaccines  Your health care provider may recommend certain vaccines, such as: Influenza vaccine. This is recommended every year. Tetanus, diphtheria, and acellular pertussis (Tdap, Td) vaccine. You may need a Td booster every 10 years. Zoster vaccine. You may need this after age 11. Pneumococcal 13-valent conjugate (PCV13) vaccine. One dose is recommended after age 58. Pneumococcal polysaccharide (PPSV23) vaccine. One dose is recommended after age 69. Talk to your health care provider about which screenings and vaccines you need and how often you need them. This information is not intended to replace advice given to you by your health care provider. Make sure you discuss any questions you have with your health care provider. Document Released: 12/19/2015 Document Revised: 08/11/2016 Document Reviewed: 09/23/2015 Elsevier Interactive Patient Education  2017 [place] Prevention in the Home Falls can cause injuries. They can happen to people of all ages. There are many things you can do to make your home safe and to help prevent falls. What can I do on the outside of my home? Regularly fix the edges of walkways and driveways and fix any cracks. Remove anything that might make you trip as you walk through a door, such as a raised step or threshold. Trim any bushes or trees on the path to your home. Use bright outdoor lighting. Clear any walking paths of anything that might make someone trip, such as rocks or tools. Regularly check to see if handrails are loose or broken. Make sure that both sides of any steps have handrails. Any raised decks and porches should have guardrails on the edges. Have any leaves, snow, or ice cleared regularly. Use sand or salt on walking paths during winter. Clean up any spills in your garage right away. This includes oil or grease spills. What can I do in the bathroom? Use night lights. Install grab bars by the toilet and in the tub and shower.  Do not use towel bars as grab bars. Use non-skid mats or decals in the tub or shower. If you need to sit down in the shower, use a plastic, non-slip stool. Keep the floor dry. Clean up any water that spills on the floor as soon as it happens. Remove soap buildup in the tub or shower regularly. Attach bath mats securely with double-sided non-slip rug tape. Do not have throw rugs and other things on the floor that can make you trip. What can I do in the bedroom? Use night lights. Make sure that you have a light by your bed that is easy to reach. Do not use any sheets or blankets that are too big for your bed. They should not hang down onto the floor. Have a firm chair that has side arms. You can use this for support while you get dressed. Do not have throw rugs and other things on the floor that can make you trip. What can I do in the kitchen? Clean up any spills right away. Avoid walking on wet floors. Keep items that you use a lot in easy-to-reach places. If you need  to reach something above you, use a strong step stool that has a grab bar. Keep electrical cords out of the way. Do not use floor polish or wax that makes floors slippery. If you must use wax, use non-skid floor wax. Do not have throw rugs and other things on the floor that can make you trip. What can I do with my stairs? Do not leave any items on the stairs. Make sure that there are handrails on both sides of the stairs and use them. Fix handrails that are broken or loose. Make sure that handrails are as long as the stairways. Check any carpeting to make sure that it is firmly attached to the stairs. Fix any carpet that is loose or worn. Avoid having throw rugs at the top or bottom of the stairs. If you do have throw rugs, attach them to the floor with carpet tape. Make sure that you have a light switch at the top of the stairs and the bottom of the stairs. If you do not have them, ask someone to add them for you. What else  can I do to help prevent falls? Wear shoes that: Do not have high heels. Have rubber bottoms. Are comfortable and fit you well. Are closed at the toe. Do not wear sandals. If you use a stepladder: Make sure that it is fully opened. Do not climb a closed stepladder. Make sure that both sides of the stepladder are locked into place. Ask someone to hold it for you, if possible. Clearly mark and make sure that you can see: Any grab bars or handrails. First and last steps. Where the edge of each step is. Use tools that help you move around (mobility aids) if they are needed. These include: Canes. Walkers. Scooters. Crutches. Turn on the lights when you go into a dark area. Replace any light bulbs as soon as they burn out. Set up your furniture so you have a clear path. Avoid moving your furniture around. If any of your floors are uneven, fix them. If there are any pets around you, be aware of where they are. Review your medicines with your doctor. Some medicines can make you feel dizzy. This can increase your chance of falling. Ask your doctor what other things that you can do to help prevent falls. This information is not intended to replace advice given to you by your health care provider. Make sure you discuss any questions you have with your health care provider. Document Released: 09/18/2009 Document Revised: 04/29/2016 Document Reviewed: 12/27/2014 Elsevier Interactive Patient Education  2017 [employer].

## 2023-04-15 NOTE — Progress Notes (Signed)
Subjective:   Lisa Petersen is a 87 y.o. female who presents for Medicare Annual (Subsequent) preventive examination.  Review of Systems    I connected with  VERTIE KRAKOW on 04/15/23 by a audio enabled telemedicine application and verified that I am speaking with the correct person using two identifiers.  Patient Location: Home  Provider Location: Home Office  I discussed the limitations of evaluation and management by telemedicine. The patient expressed understanding and agreed to proceed.  Cardiac Risk Factors include: advanced age (>39men, >57 women)     Objective:    Today's Vitals   04/15/23 1351  Weight: 145 lb (65.8 kg)   Body mass index is 22.71 kg/m.     04/15/2023    1:58 PM 04/20/2022    8:41 AM 12/02/2020   10:50 AM 12/04/2019    8:13 AM 11/27/2019    8:43 AM 08/31/2018   11:50 AM  Advanced Directives  Does Patient Have a Medical Advance Directive? No Yes Yes Yes Yes Yes  Type of Special educational needs teacher of [place];Living will Healthcare Power of [place];Living will;Out of facility DNR (pink MOST or yellow form) Healthcare Power of [place];Living will Living will;Healthcare Power of [employer] Power of [place];Living will  Does patient want to make changes to medical advance directive?  No - Patient declined No - Patient declined No - Patient declined No - Patient declined No - Patient declined  Copy of Healthcare Power of Attorney in Chart?  Yes - validated most recent copy scanned in chart (See row information) Yes - validated most recent copy scanned in chart (See row information) Yes - validated most recent copy scanned in chart (See row information) Yes - validated most recent copy scanned in chart (See row information) Yes  Would patient like information on creating a medical advance directive? Yes (MAU/Ambulatory/Procedural Areas - Information given)         Current Medications (verified) Outpatient Encounter Medications as of  04/15/2023  Medication Sig   Calcium Carbonate-Vitamin D (CALTRATE 600+D PO) Take 2 tablets by mouth daily.    Cyanocobalamin (VITAMIN B-12 PO) Take 2,500 mcg by mouth daily.    furosemide (LASIX) 20 MG tablet 0.5-1 tab daily as needed for lower extremity edema   levothyroxine (SYNTHROID) 100 MCG tablet Take 1 tablet (100 mcg total) by mouth daily before breakfast.   Misc Natural Products (OSTEO BI-FLEX JOINT SHIELD PO) Take 1 tablet by mouth 2 (two) times daily.    Multiple Vitamins-Minerals (CENTRUM SILVER PO) Take 1 tablet by mouth daily.    Multiple Vitamins-Minerals (PRESERVISION AREDS 2+MULTI VIT PO) Take 1 capsule by mouth 2 (two) times daily.   traZODone (DESYREL) 50 MG tablet TAKE 1 TABLET AT BEDTIME ASNEEDED FOR SLEEP   amoxicillin (AMOXIL) 500 MG capsule  (Patient not taking: Reported on 04/15/2023)   No facility-administered encounter medications on file as of 04/15/2023.    Allergies (verified) Amoxicillin-pot clavulanate, Etodolac, Morphine and related, Oxycontin [oxycodone hcl], Cephalexin, Metronidazole, and Sulfa antibiotics   History: Past Medical History:  Diagnosis Date   Allergy    Anxiety    Colon polyps    COVID-19    01/2022 home test no antivirals taken   Depression    History of Clostridioides difficile colitis 2014   History of endometriosis    History of Graves' disease    History of jaundice as a child    age 89   Hyperlipidemia    Hypothyroidism    Insomnia  OA (osteoarthritis)    Osteopenia    Plantar fasciitis    Rheumatoid arthritis (HCC)    Sigmoid diverticulosis 2008   Squamous cell carcinoma in situ 2017   Central chest   UTI (lower urinary tract infection)    Vitamin B 12 deficiency    Past Surgical History:  Procedure Laterality Date   ABDOMINAL HYSTERECTOMY  1975   Bilateral oophorectomy   CATARACT EXTRACTION Bilateral 2009   COLECTOMY Right 2006   COLONOSCOPY  2015   KNEE ARTHROSCOPY  2011   after knee replacement    REPLACEMENT TOTAL KNEE Right 2011   SKIN CANCER EXCISION  2017   TONSILLECTOMY AND ADENOIDECTOMY  1950   TOTAL KNEE ARTHROPLASTY Left 12/10/2019   Procedure: TOTAL KNEE ARTHROPLASTY;  Surgeon: Dannielle Huh, MD;  Location: [facility];  Service: Orthopedics;  Laterality: Left;  75 mins needed for length of case   Family History  Problem Relation Age of Onset   Hypertension Mother    Hypertension Sister    Mental illness Sister    Dementia Sister    Diabetes Brother    Cancer Brother        Kidney/Bladder   Dementia Father    Parkinson's disease Brother    Breast cancer Neg Hx    Social History   Socioeconomic History   Marital status: Widowed    Spouse name: Not on file   Number of children: Not on file   Years of education: Not on file   Highest education level: Not on file  Occupational History   Not on file  Tobacco Use   Smoking status: Never   Smokeless tobacco: Never  Vaping Use   Vaping Use: Never used  Substance and Sexual Activity   Alcohol use: No   Drug use: No   Sexual activity: Yes    Birth control/protection: None  Other Topics Concern   Not on file  Social History Narrative   Has a living will scanned into chart.   Social Determinants of Health   Financial Resource Strain: Low Risk  (04/15/2023)   Overall Financial Resource Strain (CARDIA)    Difficulty of Paying Living Expenses: Not hard at all  Food Insecurity: No Food Insecurity (04/15/2023)   Hunger Vital Sign    Worried About Running Out of Food in the Last Year: Never true    Ran Out of Food in the Last Year: Never true  Transportation Needs: No Transportation Needs (04/15/2023)   PRAPARE - Administrator, Civil Service (Medical): No    Lack of Transportation (Non-Medical): No  Physical Activity: Insufficiently Active (04/15/2023)   Exercise Vital Sign    Days of Exercise per Week: 7 days    Minutes of Exercise per Session: 20 min  Stress: No Stress Concern Present (04/15/2023)    [employer] of Occupational Health - Occupational Stress Questionnaire    Feeling of Stress : Not at all  Social Connections: Socially Integrated (04/15/2023)   Social Connection and Isolation Panel [NHANES]    Frequency of Communication with Friends and Family: More than three times a week    Frequency of Social Gatherings with Friends and Family: More than three times a week    Attends Religious Services: More than 4 times per year    Active Member of [employer] or Organizations: Yes    Attends Engineer, structural: More than 4 times per year    Marital Status: Married    Tobacco Counseling Counseling  given: Yes   Clinical Intake:  Pre-visit preparation completed: Yes  Pain : No/denies pain     Nutritional Risks: None Diabetes: No  How often do you need to have someone help you when you read instructions, pamphlets, or other written materials from your doctor or pharmacy?: 1 - Never  Diabetic?yes  Interpreter Needed?: No  Information entered by :: Fredirick Maudlin   Activities of Daily Living    04/15/2023    1:59 PM 04/20/2022    8:38 AM  In your present state of health, do you have any difficulty performing the following activities:  Hearing? 0 0  Vision? 0 0  Difficulty concentrating or making decisions? 0 0  Walking or climbing stairs? 0 0  Dressing or bathing? 0 0  Doing errands, shopping? 0 0  Preparing Food and eating ? N N  Using the Toilet? N N  In the past six months, have you accidently leaked urine? Y N  Comment  Followed by Urology.  Do you have problems with loss of bowel control? N N  Managing your Medications? N N  Managing your Finances? N N  Housekeeping or managing your Housekeeping? N N    Patient Care Team: Glori Luis, MD as PCP - General (Family Medicine) Dasher, Cliffton Asters, MD as Attending Physician (Dermatology) Galen Manila, MD as Referring Physician (Ophthalmology) Riki Altes, MD (Urology)  Indicate  any recent Medical Services you may have received from other than [facility] providers in the past year (date may be approximate).     Assessment:   This is a routine wellness examination for [place].  Hearing/Vision screen Hearing Screening - Comments:: Denies hearing difficulties   Vision Screening - Comments:: Wears rx glasses - up to date with routine eye exams with  [facility]   Dietary issues and exercise activities discussed: Current Exercise Habits: Home exercise routine, Type of exercise: Other - see comments;walking (yard work), Time (Minutes): 35, Frequency (Times/Week): 3, Weekly Exercise (Minutes/Week): 105, Intensity: Mild, Exercise limited by: None identified   Goals Addressed               This Visit's Progress     Patient Stated     Follow up with Primary Care Provider (pt-stated)   On track     Maintain Healthy Lifestyle.      Depression Screen    04/15/2023    1:55 PM 10/08/2022    4:32 PM 07/05/2022    9:11 AM 04/20/2022    8:36 AM 03/18/2022    9:34 AM 05/12/2021   10:09 AM 12/02/2020   10:49 AM  PHQ 2/9 Scores  PHQ - 2 Score 0 0 0 0 0 0 0    Fall Risk    04/15/2023    1:59 PM 10/08/2022    4:32 PM 07/05/2022    9:11 AM 04/20/2022    8:38 AM 03/18/2022    9:34 AM  Fall Risk   Falls in the past year? 0 0 0 0 0  Number falls in past yr: 0 0 0 0 0  Injury with Fall? 0 0 0  0  Risk for fall due to : No Fall Risks No Fall Risks No Fall Risks  No Fall Risks  Follow up Falls prevention discussed;Falls evaluation completed Falls evaluation completed Falls evaluation completed Falls evaluation completed Falls evaluation completed    FALL RISK PREVENTION PERTAINING TO THE HOME:  Any stairs in or around the home? Yes  If so,  are there any without handrails? Yes  Home free of loose throw rugs in walkways, pet beds, electrical cords, etc? No  Adequate lighting in your home to reduce risk of falls? Yes   ASSISTIVE DEVICES UTILIZED TO PREVENT  FALLS:  Life alert? No  Use of a cane, walker or w/c? No  Grab bars in the bathroom? No  Shower chair or bench in shower? No  Elevated toilet seat or a handicapped toilet? Yes   TIMED UP AND GO:  Was the test performed?  No Televisit .  Cognitive Function:    08/31/2018   11:57 AM  MMSE - Mini Mental State Exam  Orientation to time 5  Orientation to Place 5  Registration 3  Attention/ Calculation 5  Recall 3  Language- name 2 objects 2  Language- repeat 1  Language- follow 3 step command 3  Language- read & follow direction 1  Write a sentence 1  Copy design 1  Total score 30        04/15/2023    1:56 PM 11/27/2019    9:05 AM  6CIT Screen  What Year? 0 points 0 points  What month? 0 points 0 points  What time? 0 points 0 points  Count back from 20 2 points 0 points  Months in reverse 0 points 0 points  Repeat phrase 2 points 0 points  Total Score 4 points 0 points    Immunizations Immunization History  Administered Date(s) Administered   Influenza Split 09/23/2021, 09/10/2022   Influenza, High Dose Seasonal PF 08/31/2018, 08/20/2019   Influenza-Unspecified 08/06/2014, 08/25/2015, 09/02/2016, 08/27/2020   PFIZER(Purple Top)SARS-COV-2 Vaccination 12/27/2019, 01/17/2020, 08/21/2020, 04/11/2021   Pfizer Covid-19 Vaccine Bivalent Booster 76yrs & up 07/08/2022   Pneumococcal Conjugate-13 06/17/2015   Pneumococcal Polysaccharide-23 05/29/2009   Td 12/05/2006   Zoster Recombinat (Shingrix) 09/28/2018, 12/02/2018   Zoster, Live 12/21/2007    TDAP status: Due, Education has been provided regarding the importance of this vaccine. Advised may receive this vaccine at local pharmacy or Health Dept. Aware to provide a copy of the vaccination record if obtained from local pharmacy or Health Dept. Verbalized acceptance and understanding.  Flu Vaccine status: Up to date  Pneumococcal vaccine status: Up to date  Covid-19 vaccine status: Completed vaccines  Qualifies for  Shingles Vaccine? Yes   Zostavax completed Yes   Shingrix Completed?: Yes  Screening Tests Health Maintenance  Topic Date Due   DTaP/Tdap/Td (2 - Tdap) 12/05/2016   COVID-19 Vaccine (6 - 2023-24 season) 09/02/2022   INFLUENZA VACCINE  07/07/2023   Medicare Annual Wellness (AWV)  04/14/2024   Pneumonia Vaccine 58+ Years old  Completed   DEXA SCAN  Completed   Zoster Vaccines- Shingrix  Completed   HPV VACCINES  Aged Out    Health Maintenance  Health Maintenance Due  Topic Date Due   DTaP/Tdap/Td (2 - Tdap) 12/05/2016   COVID-19 Vaccine (6 - 2023-24 season) 09/02/2022    Colorectal cancer screening: No longer required.   Mammogram status: Completed 03/30/22. Repeat every year  Bone Density status: Completed 09/21/22. Results reflect: Bone density results: OSTEOPOROSIS. Repeat every 10 years.  Lung Cancer Screening: (Low Dose CT Chest recommended if Age 1-80 years, 30 pack-year currently smoking OR have quit w/in 15years.) does not qualify.     Additional Screening:  Hepatitis C Screening: does not qualify;   Vision Screening: Recommended annual ophthalmology exams for early detection of glaucoma and other disorders of the eye. Is the patient up to date  with their annual eye exam?  Yes  Who is the provider or what is the name of the office in which the patient attends annual eye exams? [facility] If pt is not established with a provider, would they like to be referred to a provider to establish care? No .   Dental Screening: Recommended annual dental exams for proper oral hygiene  Community Resource Referral / Chronic Care Management: CRR required this visit?  No   CCM required this visit?  No      Plan:     I have personally reviewed and noted the following in the patient's chart:   Medical and social history Use of alcohol, tobacco or illicit drugs  Current medications and supplements including opioid prescriptions. Patient is not currently  taking opioid prescriptions. Functional ability and status Nutritional status Physical activity Advanced directives List of other physicians Hospitalizations, surgeries, and ER visits in previous 12 months Vitals Screenings to include cognitive, depression, and falls Referrals and appointments  In addition, I have reviewed and discussed with patient certain preventive protocols, quality metrics, and best practice recommendations. A written personalized care plan for preventive services as well as general preventive health recommendations were provided to patient.     Annabell Sabal, CMA   04/15/2023   Nurse Notes: none

## 2023-04-19 ENCOUNTER — Ambulatory Visit
Admission: RE | Admit: 2023-04-19 | Discharge: 2023-04-19 | Disposition: A | Discharge status: Home / Self Care | Payer: Medicare HMO | Source: Ambulatory Visit | Attending: Family Medicine | Admitting: Family Medicine

## 2023-04-19 DIAGNOSIS — Z1231 Encounter for screening mammogram for malignant neoplasm of breast: Secondary | ICD-10-CM | POA: Diagnosis not present

## 2023-05-19 ENCOUNTER — Other Ambulatory Visit: Payer: Self-pay | Admitting: Family Medicine

## 2023-05-19 DIAGNOSIS — E039 Hypothyroidism, unspecified: Secondary | ICD-10-CM

## 2023-05-19 DIAGNOSIS — M810 Age-related osteoporosis without current pathological fracture: Secondary | ICD-10-CM

## 2023-05-30 ENCOUNTER — Encounter: Payer: Self-pay | Admitting: Family Medicine

## 2023-05-30 ENCOUNTER — Ambulatory Visit (INDEPENDENT_AMBULATORY_CARE_PROVIDER_SITE_OTHER): Payer: Medicare HMO | Admitting: Family Medicine

## 2023-05-30 VITALS — BP 122/82 | HR 63 | Temp 98.4°F | Ht 67.0 in | Wt 145.2 lb

## 2023-05-30 DIAGNOSIS — K1120 Sialoadenitis, unspecified: Secondary | ICD-10-CM | POA: Diagnosis not present

## 2023-05-30 DIAGNOSIS — D225 Melanocytic nevi of trunk: Secondary | ICD-10-CM | POA: Diagnosis not present

## 2023-05-30 DIAGNOSIS — L570 Actinic keratosis: Secondary | ICD-10-CM | POA: Diagnosis not present

## 2023-05-30 DIAGNOSIS — H6120 Impacted cerumen, unspecified ear: Secondary | ICD-10-CM | POA: Insufficient documentation

## 2023-05-30 DIAGNOSIS — D2272 Melanocytic nevi of left lower limb, including hip: Secondary | ICD-10-CM | POA: Diagnosis not present

## 2023-05-30 DIAGNOSIS — H6123 Impacted cerumen, bilateral: Secondary | ICD-10-CM

## 2023-05-30 DIAGNOSIS — D2262 Melanocytic nevi of left upper limb, including shoulder: Secondary | ICD-10-CM | POA: Diagnosis not present

## 2023-05-30 DIAGNOSIS — Z85828 Personal history of other malignant neoplasm of skin: Secondary | ICD-10-CM | POA: Diagnosis not present

## 2023-05-30 NOTE — Assessment & Plan Note (Signed)
Discussed she likely had a salivary gland stone that has since passed.  This would account for improving symptoms.  Discussed use of sour hard candies over the next day or 2 to promote good salivary flow.  Advised to monitor for any worsening symptoms.  Discussed if this happens again in the future she could try sour hard candies and if not improving let us know.  Discussed if she ever developed significant erythema, fever, or significant pain with something like this she should be evaluated right away.

## 2023-05-30 NOTE — Progress Notes (Signed)
  Lisa Alar, MD Phone: [phone]  Lisa Petersen is a 87 y.o. female who presents today for same day visit.   Mandibular swelling: Patient notes on Saturday she developed significant swelling in the area of her left mandibular angle.  She points to the area of her parotid gland.  She notes quite a bit of discomfort in that area with eating on Saturday.  She noted a little discomfort in her ear and noted having some pressure still there.  No associated rashes.  No fevers.  She notes the swelling has gone down and seems quite a bit better this afternoon even compared to this morning.  Social History   Tobacco Use  Smoking Status Never  Smokeless Tobacco Never    Current Outpatient Medications on File Prior to Visit  Medication Sig Dispense Refill   amoxicillin (AMOXIL) 500 MG capsule      Calcium Carbonate-Vitamin D (CALTRATE 600+D PO) Take 2 tablets by mouth daily.      Cyanocobalamin (VITAMIN B-12 PO) Take 2,500 mcg by mouth daily.      furosemide (LASIX) 20 MG tablet 0.5-1 tab daily as needed for lower extremity edema 7 tablet 0   levothyroxine (SYNTHROID) 100 MCG tablet TAKE 1 TABLET DAILY BEFORE BREAKFAST 90 tablet 2   Misc Natural Products (OSTEO BI-FLEX JOINT SHIELD PO) Take 1 tablet by mouth 2 (two) times daily.      Multiple Vitamins-Minerals (CENTRUM SILVER PO) Take 1 tablet by mouth daily.      Multiple Vitamins-Minerals (PRESERVISION AREDS 2+MULTI VIT PO) Take 1 capsule by mouth 2 (two) times daily.     traZODone (DESYREL) 50 MG tablet TAKE 1 TABLET AT BEDTIME ASNEEDED FOR SLEEP 90 tablet 1   No current facility-administered medications on file prior to visit.     ROS see history of present illness  Objective  Physical Exam Vitals:   05/30/23 1351  BP: 122/82  Pulse: 63  Temp: 98.4 F (36.9 C)  SpO2: 96%    BP Readings from Last 3 Encounters:  05/30/23 122/82  01/10/23 128/78  10/08/22 128/80   Wt Readings from Last 3 Encounters:  05/30/23 145 lb  3.2 oz (65.9 kg)  04/15/23 145 lb (65.8 kg)  01/10/23 145 lb (65.8 kg)    Physical Exam HENT:     Head:      Ears:     Comments: Bilateral ear canals with cerumen     Assessment/Plan: Please see individual problem list.  Parotid sialoadenitis Assessment & Plan: Discussed she likely had a salivary gland stone that has since passed.  This would account for improving symptoms.  Discussed use of sour hard candies over the next day or 2 to promote good salivary flow.  Advised to monitor for any worsening symptoms.  Discussed if this happens again in the future she could try sour hard candies and if not improving let us know.  Discussed if she ever developed significant erythema, fever, or significant pain with something like this she should be evaluated right away.   Bilateral impacted cerumen Assessment & Plan: Patient notes she will contact her ear nose and throat physician to have this removed.    Return if symptoms worsen or fail to improve.   Lisa Alar, MD [facility]

## 2023-05-30 NOTE — Assessment & Plan Note (Signed)
Patient notes she will contact her ear nose and throat physician to have this removed.

## 2023-06-29 ENCOUNTER — Other Ambulatory Visit: Payer: Self-pay | Admitting: Family Medicine

## 2023-07-01 ENCOUNTER — Ambulatory Visit
Admission: RE | Admit: 2023-07-01 | Discharge: 2023-07-01 | Disposition: A | Discharge status: Home / Self Care | Payer: Medicare HMO | Source: Ambulatory Visit | Attending: Urology | Admitting: Urology

## 2023-07-01 ENCOUNTER — Encounter: Payer: Self-pay | Admitting: Urology

## 2023-07-01 ENCOUNTER — Ambulatory Visit: Payer: Medicare HMO | Admitting: Urology

## 2023-07-01 ENCOUNTER — Ambulatory Visit
Admission: RE | Admit: 2023-07-01 | Discharge: 2023-07-01 | Disposition: A | Discharge status: Home / Self Care | Payer: Medicare HMO | Attending: Urology | Admitting: Urology

## 2023-07-01 VITALS — BP 136/77 | HR 80 | Ht 67.0 in | Wt 143.0 lb

## 2023-07-01 DIAGNOSIS — N200 Calculus of kidney: Secondary | ICD-10-CM

## 2023-07-01 DIAGNOSIS — R310 Gross hematuria: Secondary | ICD-10-CM

## 2023-07-01 DIAGNOSIS — R600 Localized edema: Secondary | ICD-10-CM

## 2023-07-01 LAB — URINALYSIS, COMPLETE
Bilirubin, UA: NEGATIVE
Glucose, UA: NEGATIVE
Ketones, UA: NEGATIVE
Leukocytes,UA: NEGATIVE
Nitrite, UA: NEGATIVE
Protein,UA: NEGATIVE
RBC, UA: NEGATIVE
Specific Gravity, UA: 1.015 (ref 1.005–1.030)
Urobilinogen, Ur: 0.2 mg/dL (ref 0.2–1.0)
pH, UA: 5 (ref 5.0–7.5)

## 2023-07-01 LAB — MICROSCOPIC EXAMINATION
Bacteria, UA: NONE SEEN
RBC, Urine: NONE SEEN /hpf (ref 0–2)

## 2023-07-01 MED ORDER — FUROSEMIDE 20 MG PO TABS: ORAL_TABLET | ORAL | 0 refills | Status: DC

## 2023-07-01 NOTE — Progress Notes (Signed)
I, Lisa Petersen, acting as a scribe for Lisa Altes, MD., have documented all relevant documentation on the behalf of Lisa Altes, MD, as directed by Lisa Altes, MD while in the presence of Lisa Altes, MD.  07/01/2023 2:00 PM   Lisa Petersen 01/26/1936 [id number]  Referring provider: Glori Luis, MD [address] [place],  [place] [address]  Chief Complaint  Patient presents with   Nephrolithiasis   Urologic history: 1.  Gross hematuria Episode total gross painless hematuria September 2022 Cystoscopy unremarkable; CTU 4 mm nonobstructing left lower pole renal calculus and bilateral renal cysts   2.  Left nephrolithiasis Asymptomatic  HPI: Lisa Petersen is a 87 y.o. female presents for annual follow-up.  No problem since last year's visit.  No flank, abdominal or pelvic pain No bothersome lower urinary tract symptoms.  Denies recurrent gross hematuria.   PMH: Past Medical History:  Diagnosis Date   Allergy    Anxiety    Colon polyps    COVID-19    01/2022 home test no antivirals taken   Depression    History of Clostridioides difficile colitis 2014   History of endometriosis    History of Graves' disease    History of jaundice as a child    age 42   Hyperlipidemia    Hypothyroidism    Insomnia    OA (osteoarthritis)    Osteopenia    Plantar fasciitis    Rheumatoid arthritis (HCC)    Sigmoid diverticulosis 2008   Squamous cell carcinoma in situ 2017   Central chest   UTI (lower urinary tract infection)    Vitamin B 12 deficiency     Surgical History: Past Surgical History:  Procedure Laterality Date   ABDOMINAL HYSTERECTOMY  1975   Bilateral oophorectomy   CATARACT EXTRACTION Bilateral 2009   COLECTOMY Right 2006   COLONOSCOPY  2015   KNEE ARTHROSCOPY  2011   after knee replacement   REPLACEMENT TOTAL KNEE Right 2011   SKIN CANCER EXCISION  2017   TONSILLECTOMY AND ADENOIDECTOMY  1950   TOTAL KNEE  ARTHROPLASTY Left 12/10/2019   Procedure: TOTAL KNEE ARTHROPLASTY;  Surgeon: Dannielle Huh, MD;  Location: [facility];  Service: Orthopedics;  Laterality: Left;  75 mins needed for length of case    Home Medications:  Allergies as of 07/01/2023       Reactions   Amoxicillin-pot Clavulanate Diarrhea   Etodolac Other (See Comments)   May have raised transaminases   Morphine And Codeine Nausea And Vomiting   Oxycontin [oxycodone Hcl] Nausea Only   Cephalexin Rash   Metronidazole Nausea Only, Other (See Comments)   dyspepsia   Sulfa Antibiotics Rash        Medication List        Accurate as of July 01, 2023  2:00 PM. If you have any questions, ask your nurse or doctor.          amoxicillin 500 MG capsule Commonly known as: AMOXIL   CALTRATE 600+D PO Take 2 tablets by mouth daily.   PRESERVISION AREDS 2+MULTI VIT PO Take 1 capsule by mouth 2 (two) times daily.   CENTRUM SILVER PO Take 1 tablet by mouth daily.   furosemide 20 MG tablet Commonly known as: LASIX 0.5-1 tab daily as needed for lower extremity edema   levothyroxine 100 MCG tablet Commonly known as: SYNTHROID TAKE 1 TABLET DAILY BEFORE BREAKFAST   OSTEO BI-FLEX JOINT SHIELD  PO Take 1 tablet by mouth 2 (two) times daily.   traZODone 50 MG tablet Commonly known as: DESYREL TAKE 1 TABLET AT BEDTIME ASNEEDED FOR SLEEP   VITAMIN B-12 PO Take 2,500 mcg by mouth daily.        Allergies:  Allergies  Allergen Reactions   Amoxicillin-Pot Clavulanate Diarrhea   Etodolac Other (See Comments)    May have raised transaminases     Morphine And Codeine Nausea And Vomiting   Oxycontin [Oxycodone Hcl] Nausea Only   Cephalexin Rash   Metronidazole Nausea Only and Other (See Comments)    dyspepsia    Sulfa Antibiotics Rash    Family History: Family History  Problem Relation Age of Onset   Hypertension Mother    Hypertension Sister    Mental illness Sister    Dementia Sister    Diabetes Brother     Cancer Brother        Kidney/Bladder   Dementia Father    Parkinson's disease Brother    Breast cancer Neg Hx     Social History:  reports that she has never smoked. She has never used smokeless tobacco. She reports that she does not drink alcohol and does not use drugs.   Physical Exam: BP 136/77   Pulse 80   Ht 5\' 7"  (1.702 m)   Wt 143 lb (64.9 kg)   BMI 22.40 kg/m   Constitutional:  Alert and oriented, No acute distress. HEENT: [place] AT, moist mucus membranes.  Trachea midline, no masses. Cardiovascular: No clubbing, cyanosis, or edema. Respiratory: Normal respiratory effort, no increased work of breathing. GI: Abdomen is soft, nontender, nondistended, no abdominal masses Skin: No rashes, bruises or suspicious lesions. Neurologic: Grossly intact, no focal deficits, moving all 4 extremities. Psychiatric: Normal mood and affect.   Pertinent Imaging: KUB performed earlier today was personally reviewed and interpreted. The left renal calculus is not definitely visualized due to overlying stool and bowel gas.    Assessment & Plan:    1. Left nephrolithosis Asymptomatic 1 year follow-up with KUB.  2. Lower extremity edema She is going on air/bus travel next week and develops lower extremity edema when traveling. She requested a limited time Rx furosemide which was sent. Record review her potassium levels have been normal  [facility] [address], [address] [place], [place] [address] [phone]

## 2023-07-08 ENCOUNTER — Encounter: Payer: Medicare HMO | Admitting: Family Medicine

## 2023-07-12 DIAGNOSIS — J301 Allergic rhinitis due to pollen: Secondary | ICD-10-CM | POA: Diagnosis not present

## 2023-07-12 DIAGNOSIS — H6123 Impacted cerumen, bilateral: Secondary | ICD-10-CM | POA: Diagnosis not present

## 2023-07-12 DIAGNOSIS — H6063 Unspecified chronic otitis externa, bilateral: Secondary | ICD-10-CM | POA: Diagnosis not present

## 2023-07-13 ENCOUNTER — Ambulatory Visit: Payer: Medicare HMO | Admitting: Family Medicine

## 2023-07-13 ENCOUNTER — Encounter: Payer: Self-pay | Admitting: Family Medicine

## 2023-07-13 VITALS — BP 118/76 | HR 75 | Temp 97.8°F | Ht 67.0 in | Wt 142.8 lb

## 2023-07-13 DIAGNOSIS — E039 Hypothyroidism, unspecified: Secondary | ICD-10-CM

## 2023-07-13 DIAGNOSIS — Z13220 Encounter for screening for lipoid disorders: Secondary | ICD-10-CM | POA: Diagnosis not present

## 2023-07-13 DIAGNOSIS — Z0000 Encounter for general adult medical examination without abnormal findings: Secondary | ICD-10-CM | POA: Diagnosis not present

## 2023-07-13 DIAGNOSIS — M810 Age-related osteoporosis without current pathological fracture: Secondary | ICD-10-CM | POA: Diagnosis not present

## 2023-07-13 DIAGNOSIS — Z130 Encounter for screening for diseases of the blood and blood-forming organs and certain disorders involving the immune mechanism: Secondary | ICD-10-CM | POA: Diagnosis not present

## 2023-07-13 DIAGNOSIS — H6123 Impacted cerumen, bilateral: Secondary | ICD-10-CM

## 2023-07-13 DIAGNOSIS — Z1231 Encounter for screening mammogram for malignant neoplasm of breast: Secondary | ICD-10-CM | POA: Diagnosis not present

## 2023-07-13 LAB — LIPID PANEL
Cholesterol: 192 mg/dL (ref 0–200)
HDL: 51.7 mg/dL (ref >39.00)
LDL Cholesterol: 121 mg/dL — ABNORMAL HIGH (ref 0–99)
NonHDL: 140.77
Total CHOL/HDL Ratio: 4
Triglycerides: 98 mg/dL (ref 0.0–149.0)
VLDL: 19.6 mg/dL (ref 0.0–40.0)

## 2023-07-13 LAB — CBC
HCT: 39.2 % (ref 36.0–46.0)
Hemoglobin: 13 g/dL (ref 12.0–15.0)
MCHC: 33.2 g/dL (ref 30.0–36.0)
MCV: 91.2 fl (ref 78.0–100.0)
Platelets: 226 10*3/uL (ref 150.0–400.0)
RBC: 4.3 Mil/uL (ref 3.87–5.11)
RDW: 13.4 % (ref 11.5–15.5)
WBC: 5.2 10*3/uL (ref 4.0–10.5)

## 2023-07-13 LAB — COMPREHENSIVE METABOLIC PANEL
ALT: 15 U/L (ref 0–35)
AST: 19 U/L (ref 0–37)
Albumin: 4.2 g/dL (ref 3.5–5.2)
Alkaline Phosphatase: 45 U/L (ref 39–117)
BUN: 17 mg/dL (ref 6–23)
CO2: 29 mEq/L (ref 19–32)
Calcium: 9.2 mg/dL (ref 8.4–10.5)
Chloride: 103 mEq/L (ref 96–112)
Creatinine, Ser: 0.95 mg/dL (ref 0.40–1.20)
GFR: 54.84 mL/min — ABNORMAL LOW (ref >60.00)
Glucose, Bld: 98 mg/dL (ref 70–99)
Potassium: 4.4 mEq/L (ref 3.5–5.1)
Sodium: 138 mEq/L (ref 135–145)
Total Bilirubin: 0.5 mg/dL (ref 0.2–1.2)
Total Protein: 6.6 g/dL (ref 6.0–8.3)

## 2023-07-13 LAB — T4, FREE: Free T4: 0.98 ng/dL (ref 0.60–1.60)

## 2023-07-13 LAB — TSH: TSH: 0.78 u[IU]/mL (ref 0.35–5.50)

## 2023-07-13 MED ORDER — TRAZODONE HCL 50 MG PO TABS: ORAL_TABLET | ORAL | 1 refills | Status: DC

## 2023-07-13 MED ORDER — LEVOTHYROXINE SODIUM 100 MCG PO TABS
100.0000 ug | ORAL_TABLET | Freq: Every day | ORAL | 2 refills | Status: DC
Start: 2023-07-13 — End: 2024-05-17

## 2023-07-13 NOTE — Assessment & Plan Note (Signed)
Resolved with treatment by ENT.  Monitor for recurrence.

## 2023-07-13 NOTE — Assessment & Plan Note (Signed)
Physical exam completed.  Encouraged healthy diet and exercise.  Encouraged to get tetanus vaccine at the pharmacy.  She will get the flu vaccine and updated COVID-vaccine when they come out.  Lab work as outlined.

## 2023-07-13 NOTE — Progress Notes (Signed)
Marikay Alar, MD Phone: [phone]  Lisa Petersen is a 87 y.o. female who presents today for CPE.  Diet: Generally eats what ever she wants.  Has about a third of a Diet Coke a day.  Not many sweets. Exercise: Active around the house.  Does mow her lawn though it is a Chief Strategy Officer.  Does walk some for exercise. Pap smear: Aged out Colonoscopy: Aged out Mammogram: 04/20/2023, negative Family history-  Colon cancer: no  Breast cancer: no  Ovarian cancer: no Vaccines-   Flu: Plans to get at the pharmacy  Tetanus: Due  Shingles: Up-to-date  COVID19: X 5  Pneumonia: Up-to-date Tobacco use: no Alcohol use: no Illicit Drug use: no Dentist: yes Ophthalmology: yes No she had her ears cleaned out by ENT yesterday.  She would like these rechecked today.   Active Ambulatory Problems    Diagnosis Date Noted   Hypothyroidism 11/14/2014   Insomnia 06/17/2015   History of squamous cell carcinoma of skin 04/07/2016   Anxiety and depression 05/21/2016   Routine general medical examination at a health care facility 10/07/2016   Bilateral finger numbness 04/06/2017   Osteoarthritis 04/06/2017   Axillary fullness 07/19/2019   Urinary symptom or sign 05/12/2021   Tinnitus of both ears 06/29/2021   Acute cystitis with hematuria 10/23/2021   Hematuria 10/23/2021   Age-related osteoporosis without current pathological fracture 10/08/2022   Palpitations 01/10/2023   Parotid sialoadenitis 05/30/2023   Cerumen impaction 05/30/2023   Resolved Ambulatory Problems    Diagnosis Date Noted   Protracted upper respiratory infection 11/14/2014   Right lower quadrant abdominal mass 11/14/2014   Well woman exam 06/12/2015   Medicare annual wellness visit, subsequent 06/17/2015   Grief 06/17/2015   Dizziness and giddiness 11/12/2015   Cerumen impaction 11/12/2015   UTI (urinary tract infection) 05/21/2016   Sinusitis 03/09/2018   Borderline blood pressure 03/09/2018   Urinary tract  infection without hematuria 10/17/2019   Respiratory illness 01/12/2021   Past Medical History:  Diagnosis Date   Allergy    Anxiety    Colon polyps    COVID-19    Depression    History of Clostridioides difficile colitis 2014   History of endometriosis    History of Graves' disease    History of jaundice as a child    Hyperlipidemia    OA (osteoarthritis)    Osteopenia    Plantar fasciitis    Rheumatoid arthritis (HCC)    Sigmoid diverticulosis 2008   Squamous cell carcinoma in situ 2017   UTI (lower urinary tract infection)    Vitamin B 12 deficiency     Family History  Problem Relation Age of Onset   Hypertension Mother    Hypertension Sister    Mental illness Sister    Dementia Sister    Diabetes Brother    Cancer Brother        Kidney/Bladder   Dementia Father    Parkinson's disease Brother    Breast cancer Neg Hx     Social History   Socioeconomic History   Marital status: Widowed    Spouse name: Not on file   Number of children: Not on file   Years of education: Not on file   Highest education level: Not on file  Occupational History   Not on file  Tobacco Use   Smoking status: Never   Smokeless tobacco: Never  Vaping Use   Vaping status: Never Used  Substance and Sexual Activity   Alcohol  use: No   Drug use: No   Sexual activity: Yes    Birth control/protection: None  Other Topics Concern   Not on file  Social History Narrative   Has a living will scanned into chart.   Social Determinants of Health   Financial Resource Strain: Low Risk  (04/15/2023)   Overall Financial Resource Strain (CARDIA)    Difficulty of Paying Living Expenses: Not hard at all  Food Insecurity: No Food Insecurity (04/15/2023)   Hunger Vital Sign    Worried About Running Out of Food in the Last Year: Never true    Ran Out of Food in the Last Year: Never true  Transportation Needs: No Transportation Needs (04/15/2023)   PRAPARE - Scientist, research (physical sciences) (Medical): No    Lack of Transportation (Non-Medical): No  Physical Activity: Insufficiently Active (04/15/2023)   Exercise Vital Sign    Days of Exercise per Week: 7 days    Minutes of Exercise per Session: 20 min  Stress: No Stress Concern Present (04/15/2023)   [employer] of Occupational Health - Occupational Stress Questionnaire    Feeling of Stress : Not at all  Social Connections: Socially Integrated (04/15/2023)   Social Connection and Isolation Panel [NHANES]    Frequency of Communication with Friends and Family: More than three times a week    Frequency of Social Gatherings with Friends and Family: More than three times a week    Attends Religious Services: More than 4 times per year    Active Member of [employer] or Organizations: Yes    Attends Engineer, structural: More than 4 times per year    Marital Status: Married  Catering manager Violence: Not At Risk (04/15/2023)   Humiliation, Afraid, Rape, and Kick questionnaire    Fear of Current or Ex-Partner: No    Emotionally Abused: No    Physically Abused: No    Sexually Abused: No    ROS  General:  Negative for nexplained weight loss, fever Skin: Negative for new or changing mole, sore that won't heal HEENT: Negative for trouble hearing, trouble seeing, ringing in ears, mouth sores, hoarseness, change in voice, dysphagia. CV:  Negative for chest pain, dyspnea, edema, palpitations Resp: Negative for cough, dyspnea, hemoptysis GI: Negative for nausea, vomiting, diarrhea, constipation, abdominal pain, melena, hematochezia. GU: Negative for dysuria, incontinence, urinary hesitance, hematuria, vaginal or penile discharge, polyuria, sexual difficulty, lumps in testicle or breasts MSK: Negative for muscle cramps or aches, joint pain or swelling Neuro: Negative for headaches, weakness, numbness, dizziness, passing out/fainting Psych: Negative for depression, anxiety, memory  problems  Objective  Physical Exam Vitals:   07/13/23 0936  BP: 118/76  Pulse: 75  Temp: 97.8 F (36.6 C)  SpO2: 97%    BP Readings from Last 3 Encounters:  07/13/23 118/76  07/01/23 136/77  05/30/23 122/82   Wt Readings from Last 3 Encounters:  07/13/23 142 lb 12.8 oz (64.8 kg)  07/01/23 143 lb (64.9 kg)  05/30/23 145 lb 3.2 oz (65.9 kg)    Physical Exam Constitutional:      General: She is not in acute distress.    Appearance: She is not diaphoretic.  HENT:     Head: Normocephalic and atraumatic.     Right Ear: Tympanic membrane normal.     Left Ear: Tympanic membrane normal.  Cardiovascular:     Rate and Rhythm: Normal rate and regular rhythm.     Heart sounds: Normal heart sounds.  Pulmonary:     Effort: Pulmonary effort is normal.     Breath sounds: Normal breath sounds.  Abdominal:     General: Bowel sounds are normal. There is no distension.     Palpations: Abdomen is soft.     Tenderness: There is no abdominal tenderness.  Musculoskeletal:     Right lower leg: No edema.     Left lower leg: No edema.  Lymphadenopathy:     Cervical: No cervical adenopathy.  Skin:    General: Skin is warm and dry.  Neurological:     Mental Status: She is alert.  Psychiatric:        Mood and Affect: Mood normal.      Assessment/Plan:   Routine general medical examination at a health care facility Assessment & Plan: Physical exam completed.  Encouraged healthy diet and exercise.  Encouraged to get tetanus vaccine at the pharmacy.  She will get the flu vaccine and updated COVID-vaccine when they come out.  Lab work as outlined.   Hypothyroidism, unspecified type -     Levothyroxine Sodium; Take 1 tablet (100 mcg total) by mouth daily before breakfast.  Dispense: 90 tablet; Refill: 2 -     TSH -     T4, free  Age-related osteoporosis without current pathological fracture -     Levothyroxine Sodium; Take 1 tablet (100 mcg total) by mouth daily before breakfast.   Dispense: 90 tablet; Refill: 2  Lipid screening -     Comprehensive metabolic panel -     Lipid panel  Screening for deficiency anemia -     CBC  Encounter for screening mammogram for malignant neoplasm of breast -     3D Screening Mammogram, Left and Right; Future  Bilateral impacted cerumen Assessment & Plan: Resolved with treatment by ENT.  Monitor for recurrence.   Other orders -     traZODone HCl; TAKE 1 TABLET AT BEDTIME ASNEEDED FOR SLEEP  Dispense: 90 tablet; Refill: 1    Return in about 1 year (around 07/12/2024) for physical.   Marikay Alar, MD [facility]

## 2023-07-25 NOTE — Progress Notes (Unsigned)
Cardiology Office Note  Date:  07/26/2023   ID:  Mischa, Carnett [birth year], MRN [id number]  PCP:  Glori Luis, MD   Chief Complaint  Patient presents with   Palpitations    Patient states that she experiences dyspnea when walking on an incline. Patient states that she feels tired on today. Meds reviewed.     HPI:  [name] is a 87 year old woman with past medical history of Hematuria, renal stones followed by urology Who presents by referral from Dr. Birdie Sons for palpitations, shortness of breath on exertion  On discussion today, reports having some shortness of breath when walking up her driveway "Long winter, I did too much sitting" Was going to the gym, golds gym, stopped, did bike Stopped Jan 2024 More deconditioned Likes to drive the lawnmower, does some gardening  Denies leg swelling, no PND orthopnea, no chest pain concerning for angina No prior smoking  Lab work reviewed Total cholesterol 192 LDL 121  No prior cardiac studies available  BP elevated today, typically runs well controlled as documented on prior office visits with primary care  Traveling to [place] with 102 year old grandson  EKG personally reviewed by myself on todays visit EKG Interpretation Date/Time:  Tuesday July 26 2023 15:44:57 EDT Ventricular Rate:  74 PR Interval:  160 QRS Duration:  72 QT Interval:  362 QTC Calculation: 401 R Axis:   -9  Text Interpretation: Normal sinus rhythm Possible Left atrial enlargement When compared with ECG of 26-Mar-2011 09:05, No significant change was found Confirmed by Julien Nordmann [phone]) on 07/26/2023 3:55:31 PM     PMH:   has a past medical history of Allergy, Anxiety, Colon polyps, COVID-19, Depression, History of Clostridioides difficile colitis (2014), History of endometriosis, History of Graves' disease, History of jaundice as a child, Hyperlipidemia, Hypothyroidism, Insomnia, OA (osteoarthritis), Osteopenia, Plantar  fasciitis, Rheumatoid arthritis (HCC), Sigmoid diverticulosis (2008), Squamous cell carcinoma in situ (2017), UTI (lower urinary tract infection), and Vitamin B 12 deficiency.  PSH:    Past Surgical History:  Procedure Laterality Date   ABDOMINAL HYSTERECTOMY  1975   Bilateral oophorectomy   CATARACT EXTRACTION Bilateral 2009   COLECTOMY Right 2006   COLONOSCOPY  2015   KNEE ARTHROSCOPY  2011   after knee replacement   REPLACEMENT TOTAL KNEE Right 2011   SKIN CANCER EXCISION  2017   TONSILLECTOMY AND ADENOIDECTOMY  1950   TOTAL KNEE ARTHROPLASTY Left 12/10/2019   Procedure: TOTAL KNEE ARTHROPLASTY;  Surgeon: Dannielle Huh, MD;  Location: [facility];  Service: Orthopedics;  Laterality: Left;  75 mins needed for length of case    Current Outpatient Medications  Medication Sig Dispense Refill   amoxicillin (AMOXIL) 500 MG capsule      Calcium Carbonate-Vitamin D (CALTRATE 600+D PO) Take 2 tablets by mouth daily.      Cyanocobalamin (VITAMIN B-12 PO) Take 2,500 mcg by mouth daily.      furosemide (LASIX) 20 MG tablet 0.5-1 tab daily as needed for lower extremity edema 10 tablet 0   levothyroxine (SYNTHROID) 100 MCG tablet Take 1 tablet (100 mcg total) by mouth daily before breakfast. 90 tablet 2   Misc Natural Products (OSTEO BI-FLEX JOINT SHIELD PO) Take 1 tablet by mouth 2 (two) times daily.      Multiple Vitamins-Minerals (CENTRUM SILVER PO) Take 1 tablet by mouth daily.      Multiple Vitamins-Minerals (PRESERVISION AREDS 2+MULTI VIT PO) Take 1 capsule by mouth 2 (two) times daily.  traZODone (DESYREL) 50 MG tablet TAKE 1 TABLET AT BEDTIME ASNEEDED FOR SLEEP 90 tablet 1   No current facility-administered medications for this visit.    Allergies:   Amoxicillin-pot clavulanate, Etodolac, Morphine and codeine, Oxycontin [oxycodone hcl], Cephalexin, Metronidazole, and Sulfa antibiotics   Social History:  The patient  reports that she has never smoked. She has never used smokeless tobacco.  She reports that she does not drink alcohol and does not use drugs.   Family History:   family history includes Cancer in her brother; Dementia in her father and sister; Diabetes in her brother; Hypertension in her mother and sister; Mental illness in her sister; Parkinson's disease in her brother.    Review of Systems: Review of Systems  Constitutional: Negative.   HENT: Negative.    Respiratory:  Positive for shortness of breath.   Cardiovascular: Negative.   Gastrointestinal: Negative.   Musculoskeletal: Negative.   Neurological: Negative.   Psychiatric/Behavioral: Negative.    All other systems reviewed and are negative.   PHYSICAL EXAM: VS:  BP (!) 148/78 (BP Location: Left Arm, Patient Position: Sitting, Cuff Size: Normal)   Pulse 74   Ht 5\' 7"  (1.702 m)   Wt 145 lb 9.6 oz (66 kg)   SpO2 96%   BMI 22.80 kg/m  , BMI Body mass index is 22.8 kg/m. GEN: Well nourished, well developed, in no acute distress HEENT: normal Neck: no JVD, carotid bruits, or masses Cardiac: RRR; no murmurs, rubs, or gallops,no edema  Respiratory:  clear to auscultation bilaterally, normal work of breathing GI: soft, nontender, nondistended, + BS MS: no deformity or atrophy Skin: warm and dry, no rash Neuro:  Strength and sensation are intact Psych: euthymic mood, full affect  Recent Labs: 07/13/2023: ALT 15; BUN 17; Creatinine, Ser 0.95; Hemoglobin 13.0; Platelets 226.0; Potassium 4.4; Sodium 138; TSH 0.78    Lipid Panel Lab Results  Component Value Date   CHOL 192 07/13/2023   HDL 51.70 07/13/2023   LDLCALC 121 (H) 07/13/2023   TRIG 98.0 07/13/2023      Wt Readings from Last 3 Encounters:  07/26/23 145 lb 9.6 oz (66 kg)  07/13/23 142 lb 12.8 oz (64.8 kg)  07/01/23 143 lb (64.9 kg)       ASSESSMENT AND PLAN:  Problem List Items Addressed This Visit     Palpitations - Primary   Relevant Orders   EKG 12-Lead (Completed)   Other Visit Diagnoses     Shortness of breath on  exertion       Relevant Orders   EKG 12-Lead (Completed)      Shortness of breath Has appreciated some shortness of breath after climbing hills such as her driveway Able to walk on the flat surfaces with no difficulty Reports she is sedentary, used to go to the gym but stopped in January 2024 Planning on going back to the gym, planning on traveling to [place] for 9 days No chest pain concerning for angina Suspect symptoms secondary to deconditioning, recommended she go back to the gym Echocardiogram ordered to rule out structural heart disease, she will do this when she gets back from [place] Less likely ischemia though recommended she call us if symptoms get worse  Essential hypertension Blood pressure elevated on initial check today, reports it is well-controlled at home No medication changes made    Total encounter time more than 50 minutes  Greater than 50% was spent in counseling and coordination of care with the patient  Signed, Dossie Arbour, M.D., Ph.D. [facility] [place], [place] [phone]

## 2023-07-26 ENCOUNTER — Encounter: Payer: Self-pay | Admitting: Cardiovascular Disease

## 2023-07-26 ENCOUNTER — Ambulatory Visit: Payer: Medicare HMO | Admitting: Cardiovascular Disease

## 2023-07-26 VITALS — BP 148/78 | HR 74 | Ht 67.0 in | Wt 145.6 lb

## 2023-07-26 DIAGNOSIS — R0602 Shortness of breath: Secondary | ICD-10-CM

## 2023-07-26 DIAGNOSIS — R002 Palpitations: Secondary | ICD-10-CM

## 2023-07-26 NOTE — Patient Instructions (Addendum)
Medication Instructions:  No changes  If you need a refill on your cardiac medications before your next appointment, please call your pharmacy.   Lab work: No new labs needed  Testing/Procedures: Your physician has requested that you have an echocardiogram. Echocardiography is a painless test that uses sound waves to create images of your heart. It provides your doctor with information about the size and shape of your heart and how well your heart's chambers and valves are working.   You may receive an ultrasound enhancing agent through an IV if needed to better visualize your heart during the echo. This procedure takes approximately one hour.  There are no restrictions for this procedure.  This will take place at [place] ([place]) [address], [place]   Follow-Up: At [facility], you and your health needs are our priority.  As part of our continuing mission to provide you with exceptional heart care, we have created designated Provider Care Teams.  These Care Teams include your primary Cardiologist (physician) and Advanced Practice Providers (APPs -  Physician Assistants and Nurse Practitioners) who all work together to provide you with the care you need, when you need it.  You will need a follow up appointment as needed  Providers on your designated Care Team:   Murray Hodgkins, NP Christell Faith, PA-C Cadence Kathlen Mody, [place]  COVID-19 Vaccine Information can be found at: [email or web address].uk For questions related to vaccine distribution or appointments, please email [email or web address] or call [phone].

## 2023-08-09 ENCOUNTER — Other Ambulatory Visit: Payer: Self-pay | Admitting: Cardiovascular Disease

## 2023-08-09 DIAGNOSIS — R002 Palpitations: Secondary | ICD-10-CM

## 2023-08-09 DIAGNOSIS — R0602 Shortness of breath: Secondary | ICD-10-CM

## 2023-08-18 ENCOUNTER — Ambulatory Visit: Payer: Medicare HMO | Attending: Cardiovascular Disease

## 2023-08-18 DIAGNOSIS — I083 Combined rheumatic disorders of mitral, aortic and tricuspid valves: Secondary | ICD-10-CM

## 2023-08-18 DIAGNOSIS — R0602 Shortness of breath: Secondary | ICD-10-CM

## 2023-08-18 DIAGNOSIS — I5030 Unspecified diastolic (congestive) heart failure: Secondary | ICD-10-CM

## 2023-08-18 LAB — ECHOCARDIOGRAM COMPLETE
Area-P 1/2: 2.56 cm2
S' Lateral: 2.7 cm

## 2023-08-19 ENCOUNTER — Telehealth: Payer: Self-pay | Admitting: Emergency Medicine

## 2023-08-19 MED ORDER — POTASSIUM CHLORIDE ER 10 MEQ PO TBCR: 10.0000 meq | EXTENDED_RELEASE_TABLET | ORAL | 3 refills | Status: DC

## 2023-08-19 MED ORDER — FUROSEMIDE 20 MG PO TABS: 20.0000 mg | ORAL_TABLET | ORAL | 3 refills | Status: DC

## 2023-08-19 NOTE — Telephone Encounter (Signed)
Called patient and notified her of the following recommendations from Dr. Mariah Milling.  Echocardiogram Normal left and right ventricular size and function Moderate leaking of tricuspid valve (unclear if this is contributing to shortness of breath) Would recommend taking Lasix 20 mg 3 days a week with potassium 10 mill equivalents 3 days a week to see if this helps breathing But also restart walking/exercise program for conditioning  Patient verbalizes understanding. Prescriptions sent to preferred pharmacy.

## 2023-08-19 NOTE — Telephone Encounter (Signed)
-----   Message from Julien Nordmann sent at 08/19/2023 10:45 AM EDT ----- Echocardiogram Normal left and right ventricular size and function Moderate leaking of tricuspid valve (unclear if this is contributing to shortness of breath) Would recommend taking Lasix 20 mg 3 days a week with potassium 10 mill equivalents 3 days a week to see if this helps breathing But also restart walking/exercise program for conditioning

## 2023-08-30 ENCOUNTER — Encounter: Payer: Self-pay | Admitting: Family Medicine

## 2023-08-30 ENCOUNTER — Ambulatory Visit (INDEPENDENT_AMBULATORY_CARE_PROVIDER_SITE_OTHER): Payer: Medicare HMO | Admitting: Family Medicine

## 2023-08-30 VITALS — BP 120/70 | HR 88 | Temp 99.5°F | Ht 67.0 in | Wt 142.0 lb

## 2023-08-30 DIAGNOSIS — R051 Acute cough: Secondary | ICD-10-CM | POA: Diagnosis not present

## 2023-08-30 DIAGNOSIS — R059 Cough, unspecified: Secondary | ICD-10-CM | POA: Insufficient documentation

## 2023-08-30 LAB — POC COVID19 BINAXNOW: SARS Coronavirus 2 Ag: NEGATIVE

## 2023-08-30 LAB — POCT INFLUENZA A/B
Influenza A, POC: NEGATIVE
Influenza B, POC: NEGATIVE

## 2023-08-30 MED ORDER — AZITHROMYCIN 250 MG PO TABS: ORAL_TABLET | ORAL | 0 refills | Status: DC

## 2023-08-30 MED ORDER — HYDROCODONE BIT-HOMATROP MBR 5-1.5 MG/5ML PO SOLN
5.0000 mL | Freq: Three times a day (TID) | ORAL | 0 refills | Status: DC | PRN
Start: 2023-08-30 — End: 2023-12-21

## 2023-08-30 NOTE — Assessment & Plan Note (Addendum)
Patient with likely bronchitis.  COVID and flu testing today are negative.  Given symptoms have been going on for a week with fever we will proceed with antibiotic treatment with azithromycin.  Will treat cough with hydrocodone cough syrup.  Counseled on the risk of drowsiness with hydrocodone.  Advised discontinue use if she is excessively drowsy and did not drive while taking this.  If not improving by the end of the week she will let us know.  If she develops worsening shortness of breath she will seek medical attention immediately.

## 2023-08-30 NOTE — Progress Notes (Signed)
Marikay Alar, MD Phone: [phone]  Lisa Petersen is a 87 y.o. female who presents today for same-day visit.  Cough: Patient with onset of symptoms 08/24/2023.  Notes significant amounts of coughing.  Has had fever up to 101 F.  Has postnasal drip.  No sinus congestion or chest congestion.  No sore throat.  No taste or smell disturbances.  Notes minimal shortness of breath when exerting herself.  No COVID exposures.  She had a negative COVID test on 08/25/2023.  No flu exposures.  Has been using Tylenol and Mucinex.  Social History   Tobacco Use  Smoking Status Never  Smokeless Tobacco Never    Current Outpatient Medications on File Prior to Visit  Medication Sig Dispense Refill   amoxicillin (AMOXIL) 500 MG capsule      Calcium Carbonate-Vitamin D (CALTRATE 600+D PO) Take 2 tablets by mouth daily.      Cyanocobalamin (VITAMIN B-12 PO) Take 2,500 mcg by mouth daily.      furosemide (LASIX) 20 MG tablet Take 1 tablet (20 mg total) by mouth 3 (three) times a week. 36 tablet 3   levothyroxine (SYNTHROID) 100 MCG tablet Take 1 tablet (100 mcg total) by mouth daily before breakfast. 90 tablet 2   Misc Natural Products (OSTEO BI-FLEX JOINT SHIELD PO) Take 1 tablet by mouth 2 (two) times daily.      Multiple Vitamins-Minerals (CENTRUM SILVER PO) Take 1 tablet by mouth daily.      Multiple Vitamins-Minerals (PRESERVISION AREDS 2+MULTI VIT PO) Take 1 capsule by mouth 2 (two) times daily.     potassium chloride (KLOR-CON) 10 MEQ tablet Take 1 tablet (10 mEq total) by mouth 3 (three) times a week. Take with Lasix. 36 tablet 3   traZODone (DESYREL) 50 MG tablet TAKE 1 TABLET AT BEDTIME ASNEEDED FOR SLEEP 90 tablet 1   No current facility-administered medications on file prior to visit.     ROS see history of present illness  Objective  Physical Exam Vitals:   08/30/23 1105  BP: 120/70  Pulse: 88  Temp: 99.5 F (37.5 C)  SpO2: 95%    BP Readings from Last 3 Encounters:   08/30/23 120/70  07/26/23 (!) 148/78  07/13/23 118/76   Wt Readings from Last 3 Encounters:  08/30/23 142 lb (64.4 kg)  07/26/23 145 lb 9.6 oz (66 kg)  07/13/23 142 lb 12.8 oz (64.8 kg)    Physical Exam Constitutional:      General: She is not in acute distress.    Appearance: She is not diaphoretic.  HENT:     Right Ear: Tympanic membrane normal.     Left Ear: Tympanic membrane normal.     Mouth/Throat:     Mouth: Mucous membranes are moist.     Pharynx: Oropharynx is clear.  Cardiovascular:     Rate and Rhythm: Normal rate and regular rhythm.     Heart sounds: Normal heart sounds.  Pulmonary:     Effort: Pulmonary effort is normal.     Breath sounds: Normal breath sounds.  Skin:    General: Skin is warm and dry.  Neurological:     Mental Status: She is alert.      Assessment/Plan: Please see individual problem list.  Acute cough Assessment & Plan: Patient with likely bronchitis.  COVID and flu testing today are negative.  Given symptoms have been going on for a week with fever we will proceed with antibiotic treatment with azithromycin.  Will treat cough with hydrocodone  cough syrup.  Counseled on the risk of drowsiness with hydrocodone.  Advised discontinue use if she is excessively drowsy and did not drive while taking this.  If not improving by the end of the week she will let us know.  If she develops worsening shortness of breath she will seek medical attention immediately.  Orders: -     HYDROcodone Bit-Homatrop MBr; Take 5 mLs by mouth every 8 (eight) hours as needed for cough.  Dispense: 120 mL; Refill: 0 -     Azithromycin; Take 2 tablets on day 1, then 1 tablet daily on days 2 through 5  Dispense: 6 tablet; Refill: 0 -     POCT Influenza A/B -     POC COVID-19 BinaxNow    Return if symptoms worsen or fail to improve.   Marikay Alar, MD [facility]

## 2023-09-02 ENCOUNTER — Ambulatory Visit
Admission: RE | Admit: 2023-09-02 | Discharge: 2023-09-02 | Disposition: A | Discharge status: Home / Self Care | Payer: Medicare HMO | Source: Ambulatory Visit | Attending: Family Medicine | Admitting: Family Medicine

## 2023-09-02 ENCOUNTER — Ambulatory Visit
Admission: RE | Admit: 2023-09-02 | Discharge: 2023-09-02 | Disposition: A | Discharge status: Home / Self Care | Payer: Medicare HMO | Source: Ambulatory Visit | Attending: Urology | Admitting: Urology

## 2023-09-02 ENCOUNTER — Ambulatory Visit
Admission: RE | Admit: 2023-09-02 | Discharge: 2023-09-02 | Disposition: A | Discharge status: Home / Self Care | Payer: Medicare HMO | Attending: Family Medicine | Admitting: Family Medicine

## 2023-09-02 ENCOUNTER — Telehealth: Payer: Self-pay | Admitting: Family Medicine

## 2023-09-02 DIAGNOSIS — R051 Acute cough: Secondary | ICD-10-CM

## 2023-09-02 DIAGNOSIS — R509 Fever, unspecified: Secondary | ICD-10-CM | POA: Diagnosis not present

## 2023-09-02 DIAGNOSIS — N200 Calculus of kidney: Secondary | ICD-10-CM | POA: Diagnosis not present

## 2023-09-02 DIAGNOSIS — R059 Cough, unspecified: Secondary | ICD-10-CM | POA: Diagnosis not present

## 2023-09-02 DIAGNOSIS — R310 Gross hematuria: Secondary | ICD-10-CM | POA: Insufficient documentation

## 2023-09-02 DIAGNOSIS — R918 Other nonspecific abnormal finding of lung field: Secondary | ICD-10-CM | POA: Diagnosis not present

## 2023-09-02 MED ORDER — DOXYCYCLINE HYCLATE 100 MG PO TABS: 100.0000 mg | ORAL_TABLET | Freq: Two times a day (BID) | ORAL | 0 refills | Status: DC

## 2023-09-02 NOTE — Telephone Encounter (Signed)
Noted.  At this point would like to do a chest x-ray on her.  I have placed an order for this.  She needs to have this done today.  If she develops shortness of breath, cough productive of blood, or any worsening symptoms she needs to be reevaluated in person.

## 2023-09-02 NOTE — Telephone Encounter (Signed)
Pt called stating she is not feeling better after seeing sonnenbrg on Tuesday and pt stated she was told to call him back if she wasnt

## 2023-09-02 NOTE — Telephone Encounter (Signed)
Noted.  I sent in doxycycline for her to switch to.  She can discontinue the azithromycin.  Please find out specifically what symptoms she is having at this time.  If she has any worsening symptoms she needs to be evaluated in person again.  If not improving with the doxycycline she needs to be evaluated in person.

## 2023-09-02 NOTE — Telephone Encounter (Signed)
Called Patient to let her know [facility] and [facility] the chest xray that DR. Sonnenberg ordered for her.

## 2023-09-02 NOTE — Addendum Note (Signed)
Addended by: Birdie Sons, Pearse Shiffler G on: 09/02/2023 10:07 AM   Modules accepted: Orders

## 2023-09-02 NOTE — Addendum Note (Signed)
Addended by: Birdie Sons, Rayon Mcchristian G on: 09/02/2023 12:19 PM   Modules accepted: Orders

## 2023-09-02 NOTE — Telephone Encounter (Signed)
Patient states she is still coughing but her chest is tight. Her temperature is 99.4 in the mornings and 100.4 in the late afternoons.

## 2023-09-03 ENCOUNTER — Other Ambulatory Visit: Payer: Self-pay | Admitting: Family Medicine

## 2023-09-03 DIAGNOSIS — J189 Pneumonia, unspecified organism: Secondary | ICD-10-CM

## 2023-09-03 MED ORDER — MOXIFLOXACIN HCL 400 MG PO TABS: 400.0000 mg | ORAL_TABLET | Freq: Every day | ORAL | 0 refills | Status: DC

## 2023-09-05 ENCOUNTER — Other Ambulatory Visit: Payer: Self-pay | Admitting: Family Medicine

## 2023-09-05 MED ORDER — BENZONATATE 200 MG PO CAPS: 200.0000 mg | ORAL_CAPSULE | Freq: Two times a day (BID) | ORAL | 0 refills | Status: DC | PRN

## 2023-09-09 ENCOUNTER — Telehealth: Payer: Self-pay | Admitting: Family Medicine

## 2023-09-09 ENCOUNTER — Emergency Department
Admission: EM | Admit: 2023-09-09 | Discharge: 2023-09-09 | Disposition: A | Discharge status: Home / Self Care | Payer: Medicare HMO | Attending: Emergency Medicine | Admitting: Emergency Medicine

## 2023-09-09 ENCOUNTER — Other Ambulatory Visit: Payer: Self-pay

## 2023-09-09 ENCOUNTER — Ambulatory Visit: Payer: Medicare HMO | Admitting: Nurse Practitioner

## 2023-09-09 ENCOUNTER — Ambulatory Visit (INDEPENDENT_AMBULATORY_CARE_PROVIDER_SITE_OTHER): Payer: Medicare HMO | Admitting: Internal Medicine

## 2023-09-09 ENCOUNTER — Emergency Department: Payer: Medicare HMO

## 2023-09-09 ENCOUNTER — Encounter: Payer: Self-pay | Admitting: Internal Medicine

## 2023-09-09 VITALS — BP 122/72 | HR 69 | Temp 98.7°F | Ht 67.0 in | Wt 140.2 lb

## 2023-09-09 DIAGNOSIS — R0600 Dyspnea, unspecified: Secondary | ICD-10-CM | POA: Diagnosis not present

## 2023-09-09 DIAGNOSIS — R918 Other nonspecific abnormal finding of lung field: Secondary | ICD-10-CM | POA: Diagnosis not present

## 2023-09-09 DIAGNOSIS — R0602 Shortness of breath: Secondary | ICD-10-CM | POA: Diagnosis not present

## 2023-09-09 DIAGNOSIS — R059 Cough, unspecified: Secondary | ICD-10-CM | POA: Diagnosis present

## 2023-09-09 DIAGNOSIS — J40 Bronchitis, not specified as acute or chronic: Secondary | ICD-10-CM | POA: Insufficient documentation

## 2023-09-09 DIAGNOSIS — J9601 Acute respiratory failure with hypoxia: Secondary | ICD-10-CM | POA: Insufficient documentation

## 2023-09-09 DIAGNOSIS — J9801 Acute bronchospasm: Secondary | ICD-10-CM | POA: Diagnosis not present

## 2023-09-09 DIAGNOSIS — J189 Pneumonia, unspecified organism: Secondary | ICD-10-CM | POA: Diagnosis present

## 2023-09-09 DIAGNOSIS — R9431 Abnormal electrocardiogram [ECG] [EKG]: Secondary | ICD-10-CM | POA: Insufficient documentation

## 2023-09-09 DIAGNOSIS — J209 Acute bronchitis, unspecified: Secondary | ICD-10-CM | POA: Diagnosis not present

## 2023-09-09 LAB — CBC
HCT: 41.5 % (ref 36.0–46.0)
Hemoglobin: 14.2 g/dL (ref 12.0–15.0)
MCH: 30.7 pg (ref 26.0–34.0)
MCHC: 34.2 g/dL (ref 30.0–36.0)
MCV: 89.8 fL (ref 80.0–100.0)
Platelets: 279 10*3/uL (ref 150–400)
RBC: 4.62 MIL/uL (ref 3.87–5.11)
RDW: 12 % (ref 11.5–15.5)
WBC: 7.3 10*3/uL (ref 4.0–10.5)
nRBC: 0 % (ref 0.0–0.2)

## 2023-09-09 LAB — BASIC METABOLIC PANEL
Anion gap: 11 (ref 5–15)
BUN: 16 mg/dL (ref 8–23)
CO2: 27 mmol/L (ref 22–32)
Calcium: 9.2 mg/dL (ref 8.9–10.3)
Chloride: 99 mmol/L (ref 98–111)
Creatinine, Ser: 0.95 mg/dL (ref 0.44–1.00)
GFR, Estimated: 59 mL/min — ABNORMAL LOW (ref >60)
Glucose, Bld: 102 mg/dL — ABNORMAL HIGH (ref 70–99)
Potassium: 3.9 mmol/L (ref 3.5–5.1)
Sodium: 137 mmol/L (ref 135–145)

## 2023-09-09 LAB — LACTIC ACID, PLASMA: Lactic Acid, Venous: 1.1 mmol/L (ref 0.5–1.9)

## 2023-09-09 MED ORDER — PREDNISONE 50 MG PO TABS: 50.0000 mg | ORAL_TABLET | Freq: Every day | ORAL | 0 refills | Status: DC

## 2023-09-09 MED ORDER — ALBUTEROL SULFATE HFA 108 (90 BASE) MCG/ACT IN AERS: 2.0000 | INHALATION_SPRAY | Freq: Four times a day (QID) | RESPIRATORY_TRACT | 2 refills | Status: AC | PRN

## 2023-09-09 MED ORDER — IPRATROPIUM-ALBUTEROL 0.5-2.5 (3) MG/3ML IN SOLN
3.0000 mL | Freq: Once | RESPIRATORY_TRACT | Status: AC
  Administered 2023-09-09: 3 mL via RESPIRATORY_TRACT
  Filled 2023-09-09: qty 3

## 2023-09-09 MED ORDER — PREDNISONE 20 MG PO TABS
50.0000 mg | ORAL_TABLET | Freq: Once | ORAL | Status: AC
  Administered 2023-09-09: 50 mg via ORAL
  Filled 2023-09-09: qty 1

## 2023-09-09 NOTE — Telephone Encounter (Signed)
Patient is scheduled to see Dr. Darrick Huntsman today at 4:45.

## 2023-09-09 NOTE — Progress Notes (Signed)
Subjective:  Patient ID: Lisa Petersen, female    DOB: 1936-02-05  Age: 87 y.o. MRN: [id number]  CC: The encounter diagnosis was Acute hypoxic respiratory failure (HCC).   HPI ORLA SUTCLIFFE presents for  Chief Complaint  Patient presents with   follow up on cough   87 yr old female treated for pneumonia on Sept 24 after presenting to clinic with 6 day history of cough and fevers. She was treated empirically with azithromycin but after 3 days abx was changed to doxycycline  on Sept 27  for failure to improve.  chest x ray  on sept 28 noted left sided pneumonia.  Abx was changed 8 and she completed abx today.  She continues to feel poorly , has been too weak to prepare meals,  and was noted to become hypoxic to 88% with ambulating short distances in the office.  During conversation she was noted to be short of breath,  and her room air saturations  drop to 91%.     Outpatient Medications Prior to Visit  Medication Sig Dispense Refill   benzonatate (TESSALON) 200 MG capsule Take 1 capsule (200 mg total) by mouth 2 (two) times daily as needed for cough. 20 capsule 0   Calcium Carbonate-Vitamin D (CALTRATE 600+D PO) Take 2 tablets by mouth daily.      Cyanocobalamin (VITAMIN B-12 PO) Take 2,500 mcg by mouth daily.      furosemide (LASIX) 20 MG tablet Take 1 tablet (20 mg total) by mouth 3 (three) times a week. 36 tablet 3   HYDROcodone bit-homatropine (HYCODAN) 5-1.5 MG/5ML syrup Take 5 mLs by mouth every 8 (eight) hours as needed for cough. 120 mL 0   levothyroxine (SYNTHROID) 100 MCG tablet Take 1 tablet (100 mcg total) by mouth daily before breakfast. 90 tablet 2   Misc Natural Products (OSTEO BI-FLEX JOINT SHIELD PO) Take 1 tablet by mouth 2 (two) times daily.      Multiple Vitamins-Minerals (CENTRUM SILVER PO) Take 1 tablet by mouth daily.      Multiple Vitamins-Minerals (PRESERVISION AREDS 2+MULTI VIT PO) Take 1 capsule by mouth 2 (two) times daily.     potassium chloride  (KLOR-CON) 10 MEQ tablet Take 1 tablet (10 mEq total) by mouth 3 (three) times a week. Take with Lasix. 36 tablet 3   traZODone (DESYREL) 50 MG tablet TAKE 1 TABLET AT BEDTIME ASNEEDED FOR SLEEP 90 tablet 1   moxifloxacin (AVELOX) 400 MG tablet Take 1 tablet (400 mg total) by mouth daily. (Patient not taking: Reported on 09/09/2023) 7 tablet 0   No facility-administered medications prior to visit.    Review of Systems;  Patient denies headache, fevers, malaise, unintentional weight loss, skin rash, eye pain, sinus congestion and sinus pain, sore throat, dysphagia,  hemoptysis , cough, dyspnea, wheezing, chest pain, palpitations, orthopnea, edema, abdominal pain, nausea, melena, diarrhea, constipation, flank pain, dysuria, hematuria, urinary  Frequency, nocturia, numbness, tingling, seizures,  Focal weakness, Loss of consciousness,  Tremor, insomnia, depression, anxiety, and suicidal ideation.      Objective:  BP 122/72   Pulse 69   Temp 98.7 F (37.1 C) (Oral)   Ht 5\' 7"  (1.702 m)   Wt 140 lb 3.2 oz (63.6 kg)   SpO2 94%   BMI 21.96 kg/m   BP Readings from Last 3 Encounters:  09/09/23 122/72  08/30/23 120/70  07/26/23 (!) 148/78    Wt Readings from Last 3 Encounters:  09/09/23 140 lb 3.2 oz (63.6 kg)  08/30/23 142 lb (64.4 kg)  07/26/23 145 lb 9.6 oz (66 kg)    Physical Exam Vitals reviewed.  Constitutional:      General: She is not in acute distress.    Appearance: She is normal weight. She is ill-appearing. She is not toxic-appearing or diaphoretic.  HENT:     Head: Normocephalic.  Eyes:     General: No scleral icterus.       Right eye: No discharge.        Left eye: No discharge.     Conjunctiva/sclera: Conjunctivae normal.  Cardiovascular:     Rate and Rhythm: Normal rate and regular rhythm.     Heart sounds: Normal heart sounds.  Pulmonary:     Effort: No respiratory distress.     Breath sounds: Wheezing and rales present.  Musculoskeletal:        General:  Normal range of motion.  Skin:    General: Skin is warm and dry.  Neurological:     General: No focal deficit present.     Mental Status: She is alert and oriented to person, place, and time. Mental status is at baseline.  Psychiatric:        Mood and Affect: Mood normal.        Behavior: Behavior normal.        Thought Content: Thought content normal.        Judgment: Judgment normal.    No results found for: "HGBA1C"  Lab Results  Component Value Date   CREATININE 0.95 07/13/2023   CREATININE 0.91 01/10/2023   CREATININE 0.92 07/05/2022    Lab Results  Component Value Date   WBC 5.2 07/13/2023   HGB 13.0 07/13/2023   HCT 39.2 07/13/2023   PLT 226.0 07/13/2023   GLUCOSE 98 07/13/2023   CHOL 192 07/13/2023   TRIG 98.0 07/13/2023   HDL 51.70 07/13/2023   LDLCALC 121 (H) 07/13/2023   ALT 15 07/13/2023   AST 19 07/13/2023   NA 138 07/13/2023   K 4.4 07/13/2023   CL 103 07/13/2023   CREATININE 0.95 07/13/2023   BUN 17 07/13/2023   CO2 29 07/13/2023   TSH 0.78 07/13/2023   INR 0.87 03/26/2011    DG Chest 2 View  Result Date: 09/02/2023 CLINICAL DATA:  Cough and fever. EXAM: CHEST - 2 VIEW COMPARISON:  03/26/2011 FINDINGS: Streaky opacity in the retrocardiac base suggest atelectasis. Nodular ground-glass opacity in the medial left base is new in the interval and may reflect a component of airspace infection. Right lung is clear. There is no evidence for pleural effusion. No acute bony abnormality. IMPRESSION: Nodular ground-glass opacity in the medial left base may reflect a component of airspace infection. Follow-up chest radiographs recommended to ensure resolution. Streaky opacity in the retrocardiac left base suggests atelectasis. Electronically Signed   By: Kennith Center M.D.   On: 09/02/2023 18:57    Assessment & Plan:  .Acute hypoxic respiratory failure [facility]) Assessment & Plan: Secondar yto communit acquired pneumonia.  She has failed outpatient treatment with  azithromycin,  doxycycline and now Avelox .  Directed patient and daughter to ER for evaluation and possible admission       I provided 30 minutes of face-to-face time during this encounter reviewing patient's last visit with me, patient's  most recent visit with cCP. , previous  labs and imaging studies, counseling on currently addressed issues,  and post visit ordering to diagnostics and therapeutics .   Follow-up: No follow-ups on file.  Sherlene Shams, MD

## 2023-09-09 NOTE — ED Provider Triage Note (Signed)
Emergency Medicine Provider Triage Evaluation Note  Lisa Petersen , a 87 y.o. female  was evaluated in triage.  Pt complains of SOB. Diagnosed with pneumonia by another provider, has tried multiple antibiotics and has not had any improvement.   Review of Systems  Positive: SOB, productive cough with clear mucus, fever, chills, chest soreness from coughing Negative: CP  Physical Exam  BP (!) 167/94   Pulse 65   Temp 98 F (36.7 C)   Resp 20   Ht 5\' 7"  (1.702 m)   Wt 63.5 kg   SpO2 93%   BMI 21.93 kg/m  Gen:   Awake, no distress   Resp:  Normal effort  MSK:   Moves extremities without difficulty  Other:   Medical Decision Making  Medically screening exam initiated at 5:40 PM.  Appropriate orders placed.  ELLENOR WISNIEWSKI was informed that the remainder of the evaluation will be completed by another provider, this initial triage assessment does not replace that evaluation, and the importance of remaining in the ED until their evaluation is complete.     Cameron Ali, PA-C 09/09/23 1742

## 2023-09-09 NOTE — Telephone Encounter (Signed)
Patient had pneumonia and took the last antibiotic this morning. Patient states she is not one to sit around and that is what she has been doing. Patient states the cough is still with her and sounds like it is in her bronchial tubes. Patient states her temperature is 99.3 and she just still feels bad. Patient confirmed she is a little better than she was when she saw Dr. Birdie Sons. Patient denies any SOB at this time.

## 2023-09-09 NOTE — ED Triage Notes (Signed)
Pt comes with c/o sob for two weeks. Pt was dx with pneumonia and placed on 3 rounds of meds. Pt still sob and was sent by MD to get checked out here. Pt denies any cp.

## 2023-09-09 NOTE — Assessment & Plan Note (Signed)
Secondar yto communit acquired pneumonia.  She has failed outpatient treatment with azithromycin,  doxycycline and now Avelox .  Directed patient and daughter to ER for evaluation and possible admission

## 2023-09-09 NOTE — Telephone Encounter (Signed)
Pt would like to be called concerning her not being fully recovered

## 2023-09-13 ENCOUNTER — Telehealth: Payer: Self-pay | Admitting: Family Medicine

## 2023-09-13 ENCOUNTER — Encounter: Payer: Self-pay | Admitting: Family Medicine

## 2023-09-13 ENCOUNTER — Ambulatory Visit: Payer: Medicare HMO | Admitting: Nurse Practitioner

## 2023-09-13 ENCOUNTER — Ambulatory Visit (INDEPENDENT_AMBULATORY_CARE_PROVIDER_SITE_OTHER): Payer: Medicare HMO | Admitting: Family Medicine

## 2023-09-13 VITALS — BP 142/86 | HR 73 | Temp 98.4°F | Ht 67.0 in | Wt 140.6 lb

## 2023-09-13 DIAGNOSIS — R5383 Other fatigue: Secondary | ICD-10-CM

## 2023-09-13 DIAGNOSIS — J189 Pneumonia, unspecified organism: Secondary | ICD-10-CM | POA: Insufficient documentation

## 2023-09-13 DIAGNOSIS — R051 Acute cough: Secondary | ICD-10-CM

## 2023-09-13 HISTORY — DX: Pneumonia, unspecified organism: J18.9

## 2023-09-13 LAB — CBC WITH DIFFERENTIAL/PLATELET
Basophils Absolute: 0 10*3/uL (ref 0.0–0.1)
Basophils Relative: 0.1 % (ref 0.0–3.0)
Eosinophils Absolute: 0 10*3/uL (ref 0.0–0.7)
Eosinophils Relative: 0 % (ref 0.0–5.0)
HCT: 41.2 % (ref 36.0–46.0)
Hemoglobin: 13.3 g/dL (ref 12.0–15.0)
Lymphocytes Relative: 9 % — ABNORMAL LOW (ref 12.0–46.0)
Lymphs Abs: 0.9 10*3/uL (ref 0.7–4.0)
MCHC: 32.3 g/dL (ref 30.0–36.0)
MCV: 91.6 fL (ref 78.0–100.0)
Monocytes Absolute: 0.2 10*3/uL (ref 0.1–1.0)
Monocytes Relative: 2.5 % — ABNORMAL LOW (ref 3.0–12.0)
Neutro Abs: 8.7 10*3/uL — ABNORMAL HIGH (ref 1.4–7.7)
Neutrophils Relative %: 88.4 % — ABNORMAL HIGH (ref 43.0–77.0)
Platelets: 311 10*3/uL (ref 150.0–400.0)
RBC: 4.49 Mil/uL (ref 3.87–5.11)
RDW: 13.2 % (ref 11.5–15.5)
WBC: 9.8 10*3/uL (ref 4.0–10.5)

## 2023-09-13 LAB — COMPREHENSIVE METABOLIC PANEL
ALT: 17 U/L (ref 0–35)
AST: 18 U/L (ref 0–37)
Albumin: 4.1 g/dL (ref 3.5–5.2)
Alkaline Phosphatase: 57 U/L (ref 39–117)
BUN: 14 mg/dL (ref 6–23)
CO2: 26 meq/L (ref 19–32)
Calcium: 9.8 mg/dL (ref 8.4–10.5)
Chloride: 103 meq/L (ref 96–112)
Creatinine, Ser: 0.92 mg/dL (ref 0.40–1.20)
GFR: 56.93 mL/min — ABNORMAL LOW (ref >60.00)
Glucose, Bld: 131 mg/dL — ABNORMAL HIGH (ref 70–99)
Potassium: 4.4 meq/L (ref 3.5–5.1)
Sodium: 138 meq/L (ref 135–145)
Total Bilirubin: 0.5 mg/dL (ref 0.2–1.2)
Total Protein: 6.3 g/dL (ref 6.0–8.3)

## 2023-09-13 LAB — POC COVID19 BINAXNOW: SARS Coronavirus 2 Ag: NEGATIVE

## 2023-09-13 NOTE — Telephone Encounter (Signed)
Patient called and is demanding to see a MD at office and no where else. At the time of call the only appointments were with a NP at office. Patient took a 1:40 with Evelene Croon. Patient wanted a note sent back to Dr Birdie Sons. She has been sick for about a month, she has been to the ED 2x patient states. She is now running a low grade fever and coughing. She states that Dr Birdie Sons needs to read her EKG from the hospital on Friday, 09/09/2023. Patient would like Dr Birdie Sons to look over her chart.

## 2023-09-13 NOTE — Patient Instructions (Signed)
Nice to see you. Your COVID test today was negative. We are going to get some lab work to evaluate for other underlying causes of your symptoms. Please continue to use your albuterol inhaler every 6 hours as needed. If you develop worsening shortness of breath restart coughing blood up or start having temperatures above 100.3 F please get reevaluated. Once your lab work returns we will figure out the next step in management.

## 2023-09-13 NOTE — Telephone Encounter (Signed)
Patient can come at 11:20

## 2023-09-13 NOTE — Progress Notes (Unsigned)
Marikay Alar, MD Phone: [phone]  Lisa Petersen is a 87 y.o. female who presents today for f/u.  Cough/fatigue: Patient initially seen by me on 08/26/2023.  Diagnosed with bronchitis and treated with azithromycin.  She had no improvement on that and was subsequently placed on doxycycline and had a chest x-ray that then revealed pneumonia.  The antibiotic was switched to moxifloxacin once a chest x-ray was back.  She then followed up with Dr. Darrick Huntsman on 09/09/2023 and was found to be hypoxic on ambulation.  She was sent to the emergency department where she had an x-ray that revealed clearance of her pneumonia.  She was treated with breathing treatments and started on steroids and felt quite a bit better on 09/10/2023.  On 09/11/2023 she started to feel worse and has progressively felt worse since then.  She continues to have some cough though that is quite a bit better.  She notes temperature of 99.2 F.  No postnasal drip or sore throat.  No rhinorrhea.  No sneezing.  She does have some shortness of breath with exertion and fatigue.  She finished her steroids this morning and had trouble sleeping with those.  Feels as though her heart speeds up with any exertion.  She notes no vomiting or diarrhea.  No dysuria.  No urinary frequency.  She has chronic urinary urgency that is unchanged.  Social History   Tobacco Use  Smoking Status Never  Smokeless Tobacco Never    Current Outpatient Medications on File Prior to Visit  Medication Sig Dispense Refill   albuterol (VENTOLIN HFA) 108 (90 Base) MCG/ACT inhaler Inhale 2 puffs into the lungs every 6 (six) hours as needed for wheezing or shortness of breath. 8 g 2   Calcium Carbonate-Vitamin D (CALTRATE 600+D PO) Take 2 tablets by mouth daily.      Cyanocobalamin (VITAMIN B-12 PO) Take 2,500 mcg by mouth daily.      furosemide (LASIX) 20 MG tablet Take 1 tablet (20 mg total) by mouth 3 (three) times a week. 36 tablet 3   HYDROcodone bit-homatropine  (HYCODAN) 5-1.5 MG/5ML syrup Take 5 mLs by mouth every 8 (eight) hours as needed for cough. 120 mL 0   levothyroxine (SYNTHROID) 100 MCG tablet Take 1 tablet (100 mcg total) by mouth daily before breakfast. 90 tablet 2   Misc Natural Products (OSTEO BI-FLEX JOINT SHIELD PO) Take 1 tablet by mouth 2 (two) times daily.      Multiple Vitamins-Minerals (CENTRUM SILVER PO) Take 1 tablet by mouth daily.      Multiple Vitamins-Minerals (PRESERVISION AREDS 2+MULTI VIT PO) Take 1 capsule by mouth 2 (two) times daily.     potassium chloride (KLOR-CON) 10 MEQ tablet Take 1 tablet (10 mEq total) by mouth 3 (three) times a week. Take with Lasix. 36 tablet 3   traZODone (DESYREL) 50 MG tablet TAKE 1 TABLET AT BEDTIME ASNEEDED FOR SLEEP 90 tablet 1   No current facility-administered medications on file prior to visit.     ROS see history of present illness  Objective  Physical Exam Vitals:   09/13/23 1142  BP: (!) 142/86  Pulse: 73  Temp: 98.4 F (36.9 C)  SpO2: 97%    BP Readings from Last 3 Encounters:  09/13/23 (!) 142/86  09/09/23 (!) 164/78  09/09/23 122/72   Wt Readings from Last 3 Encounters:  09/13/23 140 lb 9.6 oz (63.8 kg)  09/09/23 140 lb (63.5 kg)  09/09/23 140 lb 3.2 oz (63.6 kg)  Physical Exam Constitutional:      General: She is not in acute distress.    Appearance: She is not diaphoretic.  Cardiovascular:     Rate and Rhythm: Normal rate and regular rhythm.     Heart sounds: Normal heart sounds.  Pulmonary:     Effort: Pulmonary effort is normal.     Breath sounds: Normal breath sounds.  Abdominal:     General: Bowel sounds are normal. There is no distension.     Palpations: Abdomen is soft.     Tenderness: There is no abdominal tenderness.  Skin:    General: Skin is warm and dry.  Neurological:     Mental Status: She is alert.      Assessment/Plan: Please see individual problem list.  Acute cough -     POC COVID-19 BinaxNow -     Comprehensive  metabolic panel -     CBC with Differential/Platelet  Other fatigue -     POC COVID-19 BinaxNow -     Comprehensive metabolic panel -     CBC with Differential/Platelet  Pneumonia due to infectious organism, unspecified laterality, unspecified part of lung Assessment & Plan: She did have a chest x-ray last week that revealed clearance of the pneumonia.  She initially felt better the day after her emergency room visit though has felt worse since then.  Lungs sound clear making pneumonia unlikely and pneumonia is also unlikely in the setting of clearance on most recent chest x-ray.  Discussed that she potentially could have picked up something additional to what she had previously.  We are testing for COVID today and this was negative.  We are going to recheck lab work today to evaluate for other underlying causes.  Her cough is significantly better so I do not think she needs an antibiotic at this time.  She can continue her albuterol inhaler every 6 hours as needed.  Once her lab work returns we will determine the next step in management.     Return in 3 days (on 09/16/2023).  I have spent 32 minutes in the care of this patient regarding history taking, documentation, completion of exam, discussion of plan, placing orders.   Marikay Alar, MD [facility]

## 2023-09-13 NOTE — Assessment & Plan Note (Addendum)
She did have a chest x-ray last week that revealed clearance of the pneumonia.  She initially felt better the day after her emergency room visit though has felt worse since then.  Lungs sound clear making pneumonia unlikely and pneumonia is also unlikely in the setting of clearance on most recent chest x-ray.  Discussed that she potentially could have picked up something additional to what she had previously.  We are testing for COVID today and this was negative.  We are going to recheck lab work today to evaluate for other underlying causes.  Her cough is significantly better so I do not think she needs an antibiotic at this time.  She can continue her albuterol inhaler every 6 hours as needed.  Once her lab work returns we will determine the next step in management.

## 2023-09-13 NOTE — Telephone Encounter (Signed)
Can you add her on to my schedule at 11:20? I am happy to see her to reevaluate.

## 2023-09-16 ENCOUNTER — Encounter: Payer: Self-pay | Admitting: Family Medicine

## 2023-09-16 ENCOUNTER — Ambulatory Visit: Payer: Medicare HMO | Admitting: Family Medicine

## 2023-09-16 VITALS — BP 126/80 | HR 65 | Temp 97.8°F | Ht 67.0 in | Wt 141.8 lb

## 2023-09-16 DIAGNOSIS — J189 Pneumonia, unspecified organism: Secondary | ICD-10-CM | POA: Diagnosis not present

## 2023-09-16 NOTE — Assessment & Plan Note (Signed)
Continues to improve.  Discussed she will likely continue to improve.  Discussed by our definition her temperature is not a fever though it may be higher than her normal so that may be why she is feeling poorly when it goes up.  Discussed this should improve with time.  I suspect she will continue to improve though if she does not she will let us know.  If her temperature remains above her baseline she will contact us.

## 2023-09-16 NOTE — Progress Notes (Signed)
Marikay Alar, MD Phone: [phone]  Lisa Petersen is a 87 y.o. female who presents today for follow-up.  Pneumonia: Patient notes her symptoms are progressively improving this week.  Cough has been improving.  She did cough some mucus up recently though does not seem to be as bad as it was previously.  Has some congestion in her chest in the morning.  Some fatigue though it is improved.  No shortness of breath.  Tmax 99.4 F.  Notes in the afternoon her temperature goes up into the 99 degree range and she does seem to feel little more poorly at that time.  Notes her appetite seems to be good.  Social History   Tobacco Use  Smoking Status Never  Smokeless Tobacco Never    Current Outpatient Medications on File Prior to Visit  Medication Sig Dispense Refill   albuterol (VENTOLIN HFA) 108 (90 Base) MCG/ACT inhaler Inhale 2 puffs into the lungs every 6 (six) hours as needed for wheezing or shortness of breath. 8 g 2   Calcium Carbonate-Vitamin D (CALTRATE 600+D PO) Take 2 tablets by mouth daily.      Cyanocobalamin (VITAMIN B-12 PO) Take 2,500 mcg by mouth daily.      furosemide (LASIX) 20 MG tablet Take 1 tablet (20 mg total) by mouth 3 (three) times a week. 36 tablet 3   HYDROcodone bit-homatropine (HYCODAN) 5-1.5 MG/5ML syrup Take 5 mLs by mouth every 8 (eight) hours as needed for cough. 120 mL 0   levothyroxine (SYNTHROID) 100 MCG tablet Take 1 tablet (100 mcg total) by mouth daily before breakfast. 90 tablet 2   Misc Natural Products (OSTEO BI-FLEX JOINT SHIELD PO) Take 1 tablet by mouth 2 (two) times daily.      Multiple Vitamins-Minerals (CENTRUM SILVER PO) Take 1 tablet by mouth daily.      Multiple Vitamins-Minerals (PRESERVISION AREDS 2+MULTI VIT PO) Take 1 capsule by mouth 2 (two) times daily.     potassium chloride (KLOR-CON) 10 MEQ tablet Take 1 tablet (10 mEq total) by mouth 3 (three) times a week. Take with Lasix. 36 tablet 3   traZODone (DESYREL) 50 MG tablet TAKE 1  TABLET AT BEDTIME ASNEEDED FOR SLEEP 90 tablet 1   No current facility-administered medications on file prior to visit.     ROS see history of present illness  Objective  Physical Exam Vitals:   09/16/23 0943  BP: 126/80  Pulse: 65  Temp: 97.8 F (36.6 C)  SpO2: 95%    BP Readings from Last 3 Encounters:  09/16/23 126/80  09/13/23 (!) 142/86  09/09/23 (!) 164/78   Wt Readings from Last 3 Encounters:  09/16/23 141 lb 12.8 oz (64.3 kg)  09/13/23 140 lb 9.6 oz (63.8 kg)  09/09/23 140 lb (63.5 kg)    Physical Exam Constitutional:      General: She is not in acute distress.    Appearance: She is not diaphoretic.  Cardiovascular:     Rate and Rhythm: Normal rate and regular rhythm.     Heart sounds: Normal heart sounds.  Pulmonary:     Effort: Pulmonary effort is normal.     Breath sounds: Normal breath sounds.  Skin:    General: Skin is warm and dry.  Neurological:     Mental Status: She is alert.      Assessment/Plan: Please see individual problem list.  Pneumonia due to infectious organism, unspecified laterality, unspecified part of lung Assessment & Plan: Continues to improve.  Discussed she  will likely continue to improve.  Discussed by our definition her temperature is not a fever though it may be higher than her normal so that may be why she is feeling poorly when it goes up.  Discussed this should improve with time.  I suspect she will continue to improve though if she does not she will let us know.  If her temperature remains above her baseline she will contact us.     Return if symptoms worsen or fail to improve.   Marikay Alar, MD [facility]

## 2023-09-19 DIAGNOSIS — H52223 Regular astigmatism, bilateral: Secondary | ICD-10-CM | POA: Diagnosis not present

## 2023-09-19 DIAGNOSIS — Z9842 Cataract extraction status, left eye: Secondary | ICD-10-CM | POA: Diagnosis not present

## 2023-09-19 DIAGNOSIS — H26493 Other secondary cataract, bilateral: Secondary | ICD-10-CM | POA: Diagnosis not present

## 2023-09-19 DIAGNOSIS — Z9841 Cataract extraction status, right eye: Secondary | ICD-10-CM | POA: Diagnosis not present

## 2023-09-19 DIAGNOSIS — H5203 Hypermetropia, bilateral: Secondary | ICD-10-CM | POA: Diagnosis not present

## 2023-09-19 DIAGNOSIS — H353132 Nonexudative age-related macular degeneration, bilateral, intermediate dry stage: Secondary | ICD-10-CM | POA: Diagnosis not present

## 2023-09-21 NOTE — ED Provider Notes (Signed)
[facility] Provider Note    Event Date/Time   First MD Initiated Contact with Patient 09/09/23 1848     (approximate)   History   Shortness of Breath   HPI  Lisa Petersen is a 87 y.o. female who presents with complaints of mild shortness of breath.  Patient reports she has been on multiple courses of antibiotics with little improvement.  She does have a cough.  She does not smoke.  Symptoms have been ongoing for couple of weeks     Physical Exam   Triage Vital Signs: ED Triage Vitals  Encounter Vitals Group     BP 09/09/23 1737 (!) 167/94     Systolic BP Percentile --      Diastolic BP Percentile --      Pulse Rate 09/09/23 1737 65     Resp 09/09/23 1737 20     Temp 09/09/23 1737 98 F (36.7 C)     Temp src --      SpO2 09/09/23 1737 93 %     Weight 09/09/23 1739 63.5 kg (140 lb)     Height 09/09/23 1739 1.702 m (5\' 7" )     Head Circumference --      Peak Flow --      Pain Score 09/09/23 1736 0     Pain Loc --      Pain Education --      Exclude from Growth Chart --     Most recent vital signs: Vitals:   09/09/23 1737 09/09/23 2115  BP: (!) 167/94 (!) 164/78  Pulse: 65 86  Resp: 20 18  Temp: 98 F (36.7 C)   SpO2: 93% 100%     General: Awake, no distress.  CV:  Good peripheral perfusion.  Resp:  Normal effort.  Scattered mild wheezes, no Rales, no hypoxia Abd:  No distention.  Other:  No pain or swelling   ED Results / Procedures / Treatments   Labs (all labs ordered are listed, but only abnormal results are displayed) Labs Reviewed  BASIC METABOLIC PANEL - Abnormal; Notable for the following components:      Result Value   Glucose, Bld 102 (*)    GFR, Estimated 59 (*)    All other components within normal limits  CBC  LACTIC ACID, PLASMA     EKG  ED ECG REPORT I, Jene Every, the attending physician, personally viewed and interpreted this ECG.  Date: 09/21/2023  Rhythm: normal sinus rhythm QRS Axis:  normal Intervals: normal ST/T Wave abnormalities: normal Narrative Interpretation: no evidence of acute ischemia    RADIOLOGY X-ray viewed interpret by me, no pneumonia    PROCEDURES:  Critical Care performed:   Procedures   MEDICATIONS ORDERED IN ED: Medications  ipratropium-albuterol (DUONEB) 0.5-2.5 (3) MG/3ML nebulizer solution 3 mL (3 mLs Nebulization Given 09/09/23 1949)  ipratropium-albuterol (DUONEB) 0.5-2.5 (3) MG/3ML nebulizer solution 3 mL (3 mLs Nebulization Given 09/09/23 1949)  predniSONE (DELTASONE) tablet 50 mg (50 mg Oral Given 09/09/23 2111)  ipratropium-albuterol (DUONEB) 0.5-2.5 (3) MG/3ML nebulizer solution 3 mL (3 mLs Nebulization Given 09/09/23 2111)     IMPRESSION / MDM / ASSESSMENT AND PLAN / ED COURSE  I reviewed the triage vital signs and the nursing notes. Patient's presentation is most consistent with acute illness / injury with system symptoms.  Patient presents with cough, mild shortness of breath as described above.  Suspect bronchitis versus bronchospasm, pneumonia is on the differential as well  Lab work  reviewed and is overall reassuring, chest x-ray without evidence of pneumonia.  No chest pain or pleurisy or tachycardia  Scattered wheezes on exam, suspect bronchitis with bronchospasm, treated with DuoNebs, will start the patient on prednisone with close outpatient follow-up        FINAL CLINICAL IMPRESSION(S) / ED DIAGNOSES   Final diagnoses:  Bronchitis with bronchospasm     Rx / DC Orders   ED Discharge Orders          Ordered    predniSONE (DELTASONE) 50 MG tablet  Daily with breakfast,   Status:  Discontinued        09/09/23 2105    albuterol (VENTOLIN HFA) 108 (90 Base) MCG/ACT inhaler  Every 6 hours PRN        09/09/23 2105             Note:  This document was prepared using Dragon voice recognition software and may include unintentional dictation errors.   Jene Every, MD 09/21/23 [phone]

## 2023-09-29 ENCOUNTER — Telehealth: Payer: Self-pay | Admitting: Family Medicine

## 2023-09-29 NOTE — Telephone Encounter (Signed)
Spoke to Patient and she has a question for Dr. Birdie Sons on 10/03/23.

## 2023-09-29 NOTE — Telephone Encounter (Signed)
Patient called and states she has an upcoming xray visit here but says she had a chest xray when she went to ER I believe 10/4. She would like to know if provider wants her to keep appt since already had an xray ? Please advise

## 2023-10-03 ENCOUNTER — Other Ambulatory Visit: Payer: Medicare HMO

## 2023-10-03 ENCOUNTER — Ambulatory Visit: Payer: Medicare HMO

## 2023-10-03 DIAGNOSIS — J189 Pneumonia, unspecified organism: Secondary | ICD-10-CM | POA: Diagnosis not present

## 2023-10-03 NOTE — Telephone Encounter (Signed)
She does not need a follow-up x-ray given that her x-ray on 09/09/2023 revealed resolution of her prior pneumonia.  Her appointment for the x-ray can be canceled.

## 2023-10-03 NOTE — Telephone Encounter (Signed)
Patient would like to know if she still needs to get the chest xray today due to going to the ER on 09/09/23?

## 2023-10-26 DIAGNOSIS — Z0100 Encounter for examination of eyes and vision without abnormal findings: Secondary | ICD-10-CM | POA: Diagnosis not present

## 2023-11-14 ENCOUNTER — Ambulatory Visit (INDEPENDENT_AMBULATORY_CARE_PROVIDER_SITE_OTHER): Payer: Medicare HMO | Admitting: Internal Medicine

## 2023-11-14 ENCOUNTER — Encounter: Payer: Self-pay | Admitting: Internal Medicine

## 2023-11-14 ENCOUNTER — Ambulatory Visit
Admission: RE | Admit: 2023-11-14 | Discharge: 2023-11-14 | Disposition: A | Discharge status: Home / Self Care | Payer: Medicare HMO | Attending: Internal Medicine | Admitting: Internal Medicine

## 2023-11-14 ENCOUNTER — Ambulatory Visit
Admission: RE | Admit: 2023-11-14 | Discharge: 2023-11-14 | Disposition: A | Discharge status: Home / Self Care | Payer: Medicare HMO | Source: Ambulatory Visit | Attending: Internal Medicine | Admitting: Internal Medicine

## 2023-11-14 VITALS — BP 126/66 | HR 86 | Ht 67.0 in | Wt 145.8 lb

## 2023-11-14 DIAGNOSIS — R059 Cough, unspecified: Secondary | ICD-10-CM | POA: Diagnosis not present

## 2023-11-14 DIAGNOSIS — J989 Respiratory disorder, unspecified: Secondary | ICD-10-CM | POA: Diagnosis not present

## 2023-11-14 DIAGNOSIS — D72828 Other elevated white blood cell count: Secondary | ICD-10-CM

## 2023-11-14 DIAGNOSIS — R509 Fever, unspecified: Secondary | ICD-10-CM

## 2023-11-14 LAB — POCT INFLUENZA A/B
Influenza A, POC: NEGATIVE
Influenza B, POC: NEGATIVE

## 2023-11-14 LAB — POC COVID19 BINAXNOW: SARS Coronavirus 2 Ag: NEGATIVE

## 2023-11-14 MED ORDER — PREDNISONE 10 MG PO TABS
ORAL_TABLET | ORAL | 0 refills | Status: DC
Start: 2023-11-14 — End: 2023-12-21

## 2023-11-14 MED ORDER — DOXYCYCLINE HYCLATE 100 MG PO TABS
100.0000 mg | ORAL_TABLET | Freq: Two times a day (BID) | ORAL | 0 refills | Status: DC
Start: 2023-11-14 — End: 2023-12-22

## 2023-11-14 MED ORDER — AZITHROMYCIN 500 MG PO TABS: 500.0000 mg | ORAL_TABLET | Freq: Every day | ORAL | 0 refills | Status: DC

## 2023-11-14 NOTE — Progress Notes (Unsigned)
Subjective:  Patient ID: Lisa Petersen, female    DOB: Mar 30, 1936  Age: 87 y.o. MRN: [id number]  CC: {There were no encounter diagnoses. (Refresh or delete this SmartLink)}   HPI BRECKLYNN CURE presents for  Chief Complaint  Patient presents with   congestion and fever   87 YR OLD FEMALE  treated as an outpatient for LLL PNA in late September complicated by  prolonged symptoms culminating in observed hypoxemia requiring ER visit on Oc 4,  treated with bronchodilator therapy and steroids  for ("viral bronchitis") and offered admission but she declined.  She was discharged home didn't feel better so she resumed doxycycline (had a few days left )    and hdad complete resolution .     4 days ago she developed a dry cough , temp to 99 .('but I run a degree below normal"  )  headache without body aches,  no sinus congestion or pain .  Today she started feeling tight in the chest  .  Prior to Sept she has no history of pneumonia.  No pets in the house.  No history of environmental  allergies.  Lives alon e  No history of smoking.  But worked I an Scientist, research (physical sciences) for 30 yrs   Tolerates prednisone  except for effect on sleep,  uses trazodone  Outpatient Medications Prior to Visit  Medication Sig Dispense Refill   albuterol (VENTOLIN HFA) 108 (90 Base) MCG/ACT inhaler Inhale 2 puffs into the lungs every 6 (six) hours as needed for wheezing or shortness of breath. 8 g 2   Calcium Carbonate-Vitamin D (CALTRATE 600+D PO) Take 2 tablets by mouth daily.      Cyanocobalamin (VITAMIN B-12 PO) Take 2,500 mcg by mouth daily.      furosemide (LASIX) 20 MG tablet Take 1 tablet (20 mg total) by mouth 3 (three) times a week. 36 tablet 3   HYDROcodone bit-homatropine (HYCODAN) 5-1.5 MG/5ML syrup Take 5 mLs by mouth every 8 (eight) hours as needed for cough. 120 mL 0   levothyroxine (SYNTHROID) 100 MCG tablet Take 1 tablet (100 mcg total) by mouth daily before breakfast. 90 tablet 2   Misc  Natural Products (OSTEO BI-FLEX JOINT SHIELD PO) Take 1 tablet by mouth 2 (two) times daily.      Multiple Vitamins-Minerals (CENTRUM SILVER PO) Take 1 tablet by mouth daily.      Multiple Vitamins-Minerals (PRESERVISION AREDS 2+MULTI VIT PO) Take 1 capsule by mouth 2 (two) times daily.     potassium chloride (KLOR-CON) 10 MEQ tablet Take 1 tablet (10 mEq total) by mouth 3 (three) times a week. Take with Lasix. 36 tablet 3   traZODone (DESYREL) 50 MG tablet TAKE 1 TABLET AT BEDTIME ASNEEDED FOR SLEEP 90 tablet 1   No facility-administered medications prior to visit.    Review of Systems;  Patient denies headache, fevers, malaise, unintentional weight loss, skin rash, eye pain, sinus congestion and sinus pain, sore throat, dysphagia,  hemoptysis , cough, dyspnea, wheezing, chest pain, palpitations, orthopnea, edema, abdominal pain, nausea, melena, diarrhea, constipation, flank pain, dysuria, hematuria, urinary  Frequency, nocturia, numbness, tingling, seizures,  Focal weakness, Loss of consciousness,  Tremor, insomnia, depression, anxiety, and suicidal ideation.      Objective:  BP 126/66   Pulse 86   Ht 5\' 7"  (1.702 m)   Wt 145 lb 12.8 oz (66.1 kg)   SpO2 97%   BMI 22.84 kg/m   BP Readings from Last 3  Encounters:  11/14/23 126/66  09/16/23 126/80  09/13/23 (!) 142/86    Wt Readings from Last 3 Encounters:  11/14/23 145 lb 12.8 oz (66.1 kg)  09/16/23 141 lb 12.8 oz (64.3 kg)  09/13/23 140 lb 9.6 oz (63.8 kg)    Physical Exam  No results found for: "HGBA1C"  Lab Results  Component Value Date   CREATININE 0.92 09/13/2023   CREATININE 0.95 09/09/2023   CREATININE 0.95 07/13/2023    Lab Results  Component Value Date   WBC 9.8 09/13/2023   HGB 13.3 09/13/2023   HCT 41.2 09/13/2023   PLT 311.0 09/13/2023   GLUCOSE 131 (H) 09/13/2023   CHOL 192 07/13/2023   TRIG 98.0 07/13/2023   HDL 51.70 07/13/2023   LDLCALC 121 (H) 07/13/2023   ALT 17 09/13/2023   AST 18  09/13/2023   NA 138 09/13/2023   K 4.4 09/13/2023   CL 103 09/13/2023   CREATININE 0.92 09/13/2023   BUN 14 09/13/2023   CO2 26 09/13/2023   TSH 0.78 07/13/2023   INR 0.87 03/26/2011    DG Chest 2 View  Result Date: 09/09/2023 CLINICAL DATA:  Dyspnea EXAM: CHEST - 2 VIEW COMPARISON:  09/02/2023 FINDINGS: Previously noted left basilar nodular opacity has resolved. Lungs are clear. No pneumothorax or pleural effusion. Cardiac size within normal limits. Pulmonary vascularity is normal. No acute bone abnormality. IMPRESSION: 1. No active cardiopulmonary disease. Previously noted left basilar nodular opacity has resolved. Electronically Signed   By: Helyn Numbers M.D.   On: 09/09/2023 19:49    Assessment & Plan:  .There are no diagnoses linked to this encounter.   I provided 30 minutes of face-to-face time during this encounter reviewing patient's last visit with me, patient's  most recent visit with cardiology,  nephrology,  and neurology,  recent surgical and non surgical procedures, previous  labs and imaging studies, counseling on currently addressed issues,  and post visit ordering to diagnostics and therapeutics .   Follow-up: No follow-ups on file.   Sherlene Shams, MD

## 2023-11-14 NOTE — Patient Instructions (Signed)
Your COVID AND FLU TESTS  WERE NORMAL/.NEGATIVE  I am treating your for community acquired pneumonia with bronchitis   .  I am prescribing the following meds:   2 antibiotics  (azithromycin once daily    PLUS doxycycline twice daily for 7 days )  a 6 day  prednisone taper  To manage the infection and the inflammation.  Return for a chest x ray today or tomorrow   You can use the Albuterol MDI (metered dose inhaler) for the tight chest.  2 puffs every 6 hours as needed. It WILL make your heart race (not to worry)  \ Use  OTC  Delsym   FOR THE daytime COUGH.  Gargle with salt water as needed for sore throat.    Taking an antibiotic can create an imbalance in the normal population of bacteria that live in the small intestine.  This imbalance can persist for 3 months.   Taking a probiotic ( Align, Floraque or Culturelle), the generic version of one of these over the counter medications, or an alternative form (kombucha,  Yogurt, or another dietary source) for a minimum of 3 weeks may help prevent a serious antibiotic associated diarrhea  Called clostridium dificile colitis that occurs when the bacteria population is altered .  Taking a probiotic may also prevent vaginitis due to yeast infections and can be continued indefinitely if you feel that it improves your digestion or your elimination (bowels).

## 2023-11-15 ENCOUNTER — Telehealth: Payer: Self-pay | Admitting: Family Medicine

## 2023-11-15 LAB — CBC WITH DIFFERENTIAL/PLATELET
Basophils Absolute: 0.1 10*3/uL (ref 0.0–0.1)
Basophils Relative: 1.3 % (ref 0.0–3.0)
Eosinophils Absolute: 0.2 10*3/uL (ref 0.0–0.7)
Eosinophils Relative: 3.2 % (ref 0.0–5.0)
HCT: 40.1 % (ref 36.0–46.0)
Hemoglobin: 13.3 g/dL (ref 12.0–15.0)
Lymphocytes Relative: 18.9 % (ref 12.0–46.0)
Lymphs Abs: 1.1 10*3/uL (ref 0.7–4.0)
MCHC: 33.2 g/dL (ref 30.0–36.0)
MCV: 92.1 fL (ref 78.0–100.0)
Monocytes Absolute: 0.3 10*3/uL (ref 0.1–1.0)
Monocytes Relative: 6.1 % (ref 3.0–12.0)
Neutro Abs: 3.9 10*3/uL (ref 1.4–7.7)
Neutrophils Relative %: 70.5 % (ref 43.0–77.0)
Platelets: 222 10*3/uL (ref 150.0–400.0)
RBC: 4.35 Mil/uL (ref 3.87–5.11)
RDW: 13.7 % (ref 11.5–15.5)
WBC: 5.6 10*3/uL (ref 4.0–10.5)

## 2023-11-15 LAB — C-REACTIVE PROTEIN: CRP: <1 mg/dL (ref 0.5–20.0)

## 2023-11-15 NOTE — Assessment & Plan Note (Signed)
Given her recent pneumonia. Resulting in acute hypoxia,   A repeat chest x ray has been ordered and empiric therapy fo CAP with dual antibiotics has been ordered. Depening on the outcome of today's illness ,  she may require CT chest and Pulmonary evaluation

## 2023-11-15 NOTE — Telephone Encounter (Signed)
Patient called and states Dr. Darrick Huntsman said it was ok to accept her as a new patient due to the departure of Dr. Birdie Sons. Just would like to confirm before making any appts.

## 2023-11-17 ENCOUNTER — Telehealth: Payer: Self-pay

## 2023-11-17 NOTE — Telephone Encounter (Signed)
I called patient to schedule her transfer of care appointment with Dr. Duncan Dull and patient states she would like to leave a message for Dr. Darrick Huntsman.  Patient states she received a message from Dr. Darrick Huntsman and patient states she is feeling better but she was not sure how to respond via MyChart.

## 2023-12-16 DIAGNOSIS — Z008 Encounter for other general examination: Secondary | ICD-10-CM | POA: Diagnosis not present

## 2023-12-21 ENCOUNTER — Ambulatory Visit (INDEPENDENT_AMBULATORY_CARE_PROVIDER_SITE_OTHER): Payer: Medicare HMO | Admitting: Internal Medicine

## 2023-12-21 ENCOUNTER — Encounter: Payer: Self-pay | Admitting: Internal Medicine

## 2023-12-21 VITALS — BP 120/70 | HR 64 | Temp 98.0°F | Ht 67.0 in | Wt 143.6 lb

## 2023-12-21 DIAGNOSIS — Q228 Other congenital malformations of tricuspid valve: Secondary | ICD-10-CM | POA: Diagnosis not present

## 2023-12-21 DIAGNOSIS — R509 Fever, unspecified: Secondary | ICD-10-CM | POA: Diagnosis not present

## 2023-12-21 DIAGNOSIS — M810 Age-related osteoporosis without current pathological fracture: Secondary | ICD-10-CM

## 2023-12-21 DIAGNOSIS — E559 Vitamin D deficiency, unspecified: Secondary | ICD-10-CM

## 2023-12-21 DIAGNOSIS — R002 Palpitations: Secondary | ICD-10-CM | POA: Diagnosis not present

## 2023-12-21 DIAGNOSIS — H9313 Tinnitus, bilateral: Secondary | ICD-10-CM | POA: Diagnosis not present

## 2023-12-21 DIAGNOSIS — I071 Rheumatic tricuspid insufficiency: Secondary | ICD-10-CM | POA: Diagnosis not present

## 2023-12-21 DIAGNOSIS — J989 Respiratory disorder, unspecified: Secondary | ICD-10-CM | POA: Diagnosis not present

## 2023-12-21 DIAGNOSIS — E039 Hypothyroidism, unspecified: Secondary | ICD-10-CM | POA: Diagnosis not present

## 2023-12-21 MED ORDER — TRAZODONE HCL 50 MG PO TABS: ORAL_TABLET | ORAL | 1 refills | Status: DC

## 2023-12-21 MED ORDER — ALENDRONATE SODIUM 70 MG PO TABS: 70.0000 mg | ORAL_TABLET | ORAL | 3 refills | Status: DC

## 2023-12-21 NOTE — Patient Instructions (Signed)
 Good to see you (almost fully ) recovered!   We are starting alendronate  to decrease your risk of fracture  Take the tablet on Sunday mornings before Church ( at least 1 hours after your thyroid  medication and on an empty stomach.)  See you in 6 months

## 2023-12-21 NOTE — Progress Notes (Signed)
Subjective:  Patient ID: Lisa Petersen, female    DOB: 1936-04-22  Age: 88 y.o. MRN: [id number]  CC: The primary encounter diagnosis was Vitamin D deficiency. Diagnoses of Hypothyroidism, unspecified type, Age-related osteoporosis without current pathological fracture, Tinnitus of both ears, Palpitations, and Febrile respiratory illness were also pertinent to this visit.   HPI [name] presents for  Chief Complaint  Patient presents with   Transitions Of Care    Lisa Petersen is a delightful 88 yr old  female Seen by me  on Dec 9 for  persistent symptoms following a presumed pneumonia and is here to transfer care from DR Birdie Sons who is leaving.   H/o pneumonia:  she had a chest x ray  on Dec 9 which which was neg for pna.  She has been gradually regaining her endurance after a plrlonged respiratory illness,  her cough and malaise have resolved,       1) she has been wearing a ZIO monitor for the past 6 days which was placed by an Nurse, adult making an unsolicited house call . This  so called RN also gave her a  "breathing test" (spirometry, presumed ) which was administered and adjudicated as "fine with just 3 deep breaths)     2) Cardiomyopathy:  she has been taking lasix 20 mg every other day per [facility] for management of dyspnea attributed to the sequelae of tricuspid regurgitation.  She has not noted any  improvement in her exertional dyspnea.  She has no dyspnea unless she walks her driveway that has a gentle incline. Marland Kitchen   3) Hypothyroidism;  takes thyroid appropriately .  4) osteopenia Takes 5000  Ius D3 daily   plus a calcium supplement .  Reviewed her DEXA scores and Dr Purvis Sheffield offer of alendronate for elevated risk of fracture .  She has no contraindiations to alendronate     Outpatient Medications Prior to Visit  Medication Sig Dispense Refill   albuterol (VENTOLIN HFA) 108 (90 Base) MCG/ACT inhaler Inhale 2 puffs into the lungs every 6 (six) hours as needed for  wheezing or shortness of breath. 8 g 2   Calcium Carbonate-Vitamin D (CALTRATE 600+D PO) Take 2 tablets by mouth daily.      Cyanocobalamin (VITAMIN B-12 PO) Take 2,500 mcg by mouth daily.      levothyroxine (SYNTHROID) 100 MCG tablet Take 1 tablet (100 mcg total) by mouth daily before breakfast. 90 tablet 2   Misc Natural Products (OSTEO BI-FLEX JOINT SHIELD PO) Take 1 tablet by mouth 2 (two) times daily.      Multiple Vitamins-Minerals (CENTRUM SILVER PO) Take 1 tablet by mouth daily.      Multiple Vitamins-Minerals (PRESERVISION AREDS 2+MULTI VIT PO) Take 1 capsule by mouth 2 (two) times daily.     traZODone (DESYREL) 50 MG tablet TAKE 1 TABLET AT BEDTIME ASNEEDED FOR SLEEP 90 tablet 1   azithromycin (ZITHROMAX) 500 MG tablet Take 1 tablet (500 mg total) by mouth daily. 7 tablet 0   doxycycline (VIBRA-TABS) 100 MG tablet Take 1 tablet (100 mg total) by mouth 2 (two) times daily. 14 tablet 0   furosemide (LASIX) 20 MG tablet Take 1 tablet (20 mg total) by mouth 3 (three) times a week. 36 tablet 3   HYDROcodone bit-homatropine (HYCODAN) 5-1.5 MG/5ML syrup Take 5 mLs by mouth every 8 (eight) hours as needed for cough. 120 mL 0   potassium chloride (KLOR-CON) 10 MEQ tablet Take 1 tablet (10 mEq  total) by mouth 3 (three) times a week. Take with Lasix. 36 tablet 3   predniSONE (DELTASONE) 10 MG tablet 6 tablets on Day 1 , then reduce by 1 tablet daily until gone 21 tablet 0   No facility-administered medications prior to visit.    Review of Systems;  Patient denies headache, fevers, malaise, unintentional weight loss, skin rash, eye pain, sinus congestion and sinus pain, sore throat, dysphagia,  hemoptysis , cough, dyspnea, wheezing, chest pain, palpitations, orthopnea, edema, abdominal pain, nausea, melena, diarrhea, constipation, flank pain, dysuria, hematuria, urinary  Frequency, nocturia, numbness, tingling, seizures,  Focal weakness, Loss of consciousness,  Tremor, insomnia, depression,  anxiety, and suicidal ideation.      Objective:  BP 120/70   Pulse 64   Temp 98 F (36.7 C) (Oral)   Ht 5\' 7"  (1.702 m)   Wt 143 lb 9.6 oz (65.1 kg)   SpO2 97%   BMI 22.49 kg/m   BP Readings from Last 3 Encounters:  12/21/23 120/70  11/14/23 126/66  09/16/23 126/80    Wt Readings from Last 3 Encounters:  12/21/23 143 lb 9.6 oz (65.1 kg)  11/14/23 145 lb 12.8 oz (66.1 kg)  09/16/23 141 lb 12.8 oz (64.3 kg)    Physical Exam Vitals reviewed.  Constitutional:      General: She is not in acute distress.    Appearance: Normal appearance. She is normal weight. She is not ill-appearing, toxic-appearing or diaphoretic.  HENT:     Head: Normocephalic.  Eyes:     General: No scleral icterus.       Right eye: No discharge.        Left eye: No discharge.     Conjunctiva/sclera: Conjunctivae normal.  Cardiovascular:     Rate and Rhythm: Normal rate and regular rhythm.     Heart sounds: Normal heart sounds.  Pulmonary:     Effort: Pulmonary effort is normal. No respiratory distress.     Breath sounds: Normal breath sounds.  Musculoskeletal:        General: Normal range of motion.  Skin:    General: Skin is warm and dry.  Neurological:     General: No focal deficit present.     Mental Status: She is alert and oriented to person, place, and time. Mental status is at baseline.  Psychiatric:        Mood and Affect: Mood normal.        Behavior: Behavior normal.        Thought Content: Thought content normal.        Judgment: Judgment normal.   No results found for: "HGBA1C"  Lab Results  Component Value Date   CREATININE 0.92 09/13/2023   CREATININE 0.95 09/09/2023   CREATININE 0.95 07/13/2023    Lab Results  Component Value Date   WBC 5.6 11/14/2023   HGB 13.3 11/14/2023   HCT 40.1 11/14/2023   PLT 222.0 11/14/2023   GLUCOSE 131 (H) 09/13/2023   CHOL 192 07/13/2023   TRIG 98.0 07/13/2023   HDL 51.70 07/13/2023   LDLCALC 121 (H) 07/13/2023   ALT 17  09/13/2023   AST 18 09/13/2023   NA 138 09/13/2023   K 4.4 09/13/2023   CL 103 09/13/2023   CREATININE 0.92 09/13/2023   BUN 14 09/13/2023   CO2 26 09/13/2023   TSH 0.78 07/13/2023   INR 0.87 03/26/2011    DG Chest 2 View Result Date: 11/21/2023 CLINICAL DATA:  88 year old female with fever and cough  EXAM: CHEST - 2 VIEW COMPARISON:  10/03/2023 FINDINGS: Cardiomediastinal silhouette unchanged in size and contour. No evidence of central vascular congestion. No interlobular septal thickening. No pneumothorax or pleural effusion. Coarsened interstitial markings, with no confluent airspace disease. No acute displaced fracture. Degenerative changes of the spine. IMPRESSION: No active cardiopulmonary disease. Electronically Signed   By: Gilmer Mor D.O.   On: 11/21/2023 10:23    Assessment & Plan:  .Vitamin D deficiency -     VITAMIN D 25 Hydroxy (Vit-D Deficiency, Fractures)  Hypothyroidism, unspecified type Assessment & Plan: Thyroid function ihas been WNL on current dose.  No current changes needed.  She requests biannual evaluations.  TSH is pending   Lab Results  Component Value Date   TSH 0.78 07/13/2023     Orders: -     TSH  Age-related osteoporosis without current pathological fracture Assessment & Plan: Trial of alendronate therapy initiated today    Tinnitus of both ears -     Basic metabolic panel  Palpitations Assessment & Plan: ZIO monitor was placed by [employer] RN during January 2025 home visit and is in progress    Febrile respiratory illness Assessment & Plan: Given her recent pneumonia. Resulting in acute hypoxia,   A repeat chest x ray  was done which noted resoution .  She has completed a second round of abx without diarreha and is making steady recovery    Other orders -     traZODone HCl; TAKE 1 TABLET AT BEDTIME ASNEEDED FOR SLEEP  Dispense: 90 tablet; Refill: 1 -     Alendronate Sodium; Take 1 tablet (70 mg total) by mouth every 7 (seven) days.  Take with a full glass of water on an empty stomach.  Dispense: 12 tablet; Refill: 3     I provided 30 minutes of face-to-face time during this encounter reviewing patient's last visit with me, patient's  most recent visit with cardiology,   recent surgical and non surgical procedures, previous  labs and imaging studies, counseling on currently addressed issues,  and post visit ordering to diagnostics and therapeutics .   Follow-up: Return in about 6 months (around 06/19/2024) for hypothyroidism.   Sherlene Shams, MD

## 2023-12-22 DIAGNOSIS — I071 Rheumatic tricuspid insufficiency: Secondary | ICD-10-CM | POA: Insufficient documentation

## 2023-12-22 DIAGNOSIS — I429 Cardiomyopathy, unspecified: Secondary | ICD-10-CM | POA: Insufficient documentation

## 2023-12-22 LAB — BASIC METABOLIC PANEL
BUN: 16 mg/dL (ref 6–23)
CO2: 28 meq/L (ref 19–32)
Calcium: 9.5 mg/dL (ref 8.4–10.5)
Chloride: 103 meq/L (ref 96–112)
Creatinine, Ser: 0.84 mg/dL (ref 0.40–1.20)
GFR: 63.37 mL/min (ref >60.00)
Glucose, Bld: 95 mg/dL (ref 70–99)
Potassium: 4.2 meq/L (ref 3.5–5.1)
Sodium: 138 meq/L (ref 135–145)

## 2023-12-22 LAB — VITAMIN D 25 HYDROXY (VIT D DEFICIENCY, FRACTURES): VITD: 99.26 ng/mL (ref 30.00–100.00)

## 2023-12-22 LAB — TSH: TSH: 1.29 u[IU]/mL (ref 0.35–5.50)

## 2023-12-22 NOTE — Assessment & Plan Note (Signed)
By recent ECHO.. she has noted no difference with trial of every other day furosemide and has been advised to dc medication

## 2023-12-22 NOTE — Assessment & Plan Note (Signed)
ZIO monitor was placed by [employer] RN during January 2025 home visit and is in progress

## 2023-12-22 NOTE — Assessment & Plan Note (Signed)
Trial of alendronate therapy initiated today

## 2023-12-22 NOTE — Assessment & Plan Note (Signed)
Thyroid function ihas been WNL on current dose.  No current changes needed.  She requests biannual evaluations.  TSH is pending   Lab Results  Component Value Date   TSH 0.78 07/13/2023

## 2023-12-22 NOTE — Assessment & Plan Note (Signed)
Given her recent pneumonia. Resulting in acute hypoxia,   A repeat chest x ray  was done which noted resoution .  She has completed a second round of abx without diarreha and is making steady recovery

## 2024-01-11 ENCOUNTER — Telehealth: Payer: Self-pay | Admitting: Family Medicine

## 2024-01-11 NOTE — Telephone Encounter (Signed)
 Pt.notified

## 2024-01-11 NOTE — Telephone Encounter (Signed)
 Please let the patient know that I got a fax from [facility] that her levothyroxine  was part of a recent recall.  She should contact her pharmacy to get a replacement supply.

## 2024-03-05 ENCOUNTER — Other Ambulatory Visit: Payer: Self-pay | Admitting: Internal Medicine

## 2024-03-05 DIAGNOSIS — Z1231 Encounter for screening mammogram for malignant neoplasm of breast: Secondary | ICD-10-CM

## 2024-03-26 ENCOUNTER — Telehealth: Payer: Self-pay

## 2024-03-26 DIAGNOSIS — Z780 Asymptomatic menopausal state: Secondary | ICD-10-CM

## 2024-03-26 NOTE — Addendum Note (Signed)
 Addended by: [name] Colonel on: 03/26/2024 10:49 AM   Modules accepted: Orders

## 2024-03-26 NOTE — Addendum Note (Signed)
 Addended by: Thersia Flax on: 03/26/2024 12:48 PM   Modules accepted: Orders

## 2024-03-26 NOTE — Telephone Encounter (Signed)
 I have pended the order for your approval

## 2024-03-26 NOTE — Telephone Encounter (Signed)
 Copied from CRM [id number]. Topic: Referral - Request for Referral >> Mar 26, 2024  9:06 AM Adonis Hoot wrote: Did the patient discuss referral with their provider in the last year? No (If No - schedule appointment) (If Yes - send message)  Appointment offered? No  Type of order/referral and detailed reason for visit: bone density  Preference of office, provider, location: [place]   If referral order, have you been seen by this specialty before? Yes (If Yes, this issue or another issue? When? Where?  Can we respond through MyChart? Yes

## 2024-04-12 ENCOUNTER — Encounter (HOSPITAL_COMMUNITY): Payer: Self-pay

## 2024-04-17 ENCOUNTER — Ambulatory Visit (INDEPENDENT_AMBULATORY_CARE_PROVIDER_SITE_OTHER): Admitting: *Deleted

## 2024-04-17 VITALS — Ht 67.0 in | Wt 143.0 lb

## 2024-04-17 DIAGNOSIS — Z0000 Encounter for general adult medical examination without abnormal findings: Secondary | ICD-10-CM | POA: Diagnosis not present

## 2024-04-17 NOTE — Patient Instructions (Signed)
 Ms. Uram , Thank you for taking time out of your busy schedule to complete your Annual Wellness Visit with me. I enjoyed our conversation and look forward to speaking with you again next year. I, as well as your care team,  appreciate your ongoing commitment to your health goals. Please review the following plan we discussed and let me know if I can assist you in the future. Your Game plan/ To Do List    Referrals: If you haven't heard from the office you've been referred to, please reach out to them at the phone provided.   Remember to update your tetanus (Tdap) vaccine.  Follow up Visits: Next Medicare AWV with our clinical staff: 04/23/25 @ 1:00   Have you seen your provider in the last 6 months (3 months if uncontrolled diabetes)? Yes Next Office Visit with your provider: 06/20/24  Clinician Recommendations:  Aim for 30 minutes of exercise or brisk walking, 6-8 glasses of water , and 5 servings of fruits and vegetables each day.       This is a list of the screening recommended for you and due dates:  Health Maintenance  Topic Date Due   DTaP/Tdap/Td vaccine (2 - Tdap) 12/05/2016   COVID-19 Vaccine (6 - 2024-25 season) 12/05/2024*   Flu Shot  07/06/2024   Medicare Annual Wellness Visit  04/17/2025   Pneumonia Vaccine  Completed   DEXA scan (bone density measurement)  Completed   Zoster (Shingles) Vaccine  Completed   HPV Vaccine  Aged Out   Meningitis B Vaccine  Aged Out  *Topic was postponed. The date shown is not the original due date.    Advanced directives: (In Chart) A copy of your advanced directives are scanned into your chart should your provider ever need it. Advance Care Planning is important because it:  [x]  Makes sure you receive the medical care that is consistent with your values, goals, and preferences  [x]  It provides guidance to your family and loved ones and reduces their decisional burden about whether or not they are making the right decisions based on your  wishes.

## 2024-04-17 NOTE — Progress Notes (Signed)
 Subjective:   Lisa Petersen is a 88 y.o. who presents for a Medicare Wellness preventive visit.  As a reminder, Annual Wellness Visits don't include a physical exam, and some assessments may be limited, especially if this visit is performed virtually. We may recommend an in-person visit if needed.  Visit Complete: Virtual I connected with  Lisa Petersen on 04/17/24 by a audio enabled telemedicine application and verified that I am speaking with the correct person using two identifiers.  Patient Location: Home  Provider Location: Home Office  I discussed the limitations of evaluation and management by telemedicine. The patient expressed understanding and agreed to proceed.  Vital Signs: Because this visit was a virtual/telehealth visit, some criteria may be missing or patient reported. Any vitals not documented were not able to be obtained and vitals that have been documented are patient reported.  VideoDeclined- This patient declined Librarian, academic. Therefore the visit was completed with audio only.  Persons Participating in Visit: Patient.  AWV Questionnaire: No: Patient Medicare AWV questionnaire was not completed prior to this visit.  Cardiac Risk Factors include: advanced age (>28men, >31 women)     Objective:     Today's Vitals   04/17/24 1300  Weight: 143 lb (64.9 kg)  Height: 5\' 7"  (1.702 m)   Body mass index is 22.4 kg/m.     04/17/2024    1:18 PM 09/09/2023    5:38 PM 04/15/2023    1:58 PM 04/20/2022    8:41 AM 12/02/2020   10:50 AM 12/04/2019    8:13 AM 11/27/2019    8:43 AM  Advanced Directives  Does Patient Have a Medical Advance Directive? Yes No No Yes Yes Yes Yes  Type of Estate agent of [place];Living will   Healthcare Power of [place];Living will Healthcare Power of [place];Living will;Out of facility DNR (pink MOST or yellow form) Healthcare Power of [place];Living will Living  will;Healthcare Power of Attorney  Does patient want to make changes to medical advance directive? No - Patient declined   No - Patient declined No - Patient declined No - Patient declined No - Patient declined  Copy of Healthcare Power of Attorney in Chart? Yes - validated most recent copy scanned in chart (See row information)   Yes - validated most recent copy scanned in chart (See row information) Yes - validated most recent copy scanned in chart (See row information) Yes - validated most recent copy scanned in chart (See row information) Yes - validated most recent copy scanned in chart (See row information)  Would patient like information on creating a medical advance directive?   Yes (MAU/Ambulatory/Procedural Areas - Information given)        Current Medications (verified) Outpatient Encounter Medications as of 04/17/2024  Medication Sig   alendronate  (FOSAMAX ) 70 MG tablet Take 1 tablet (70 mg total) by mouth every 7 (seven) days. Take with a full glass of water  on an empty stomach.   Calcium Carbonate-Vitamin D  (CALTRATE 600+D PO) Take 2 tablets by mouth daily.    Cyanocobalamin (VITAMIN B-12 PO) Take 2,500 mcg by mouth daily.    levothyroxine  (SYNTHROID ) 100 MCG tablet Take 1 tablet (100 mcg total) by mouth daily before breakfast.   Misc Natural Products (OSTEO BI-FLEX JOINT SHIELD PO) Take 1 tablet by mouth 2 (two) times daily.    Multiple Vitamins-Minerals (CENTRUM SILVER PO) Take 1 tablet by mouth daily.    Multiple Vitamins-Minerals (PRESERVISION AREDS 2+MULTI VIT PO) Take 1  capsule by mouth 2 (two) times daily.   traZODone  (DESYREL ) 50 MG tablet TAKE 1 TABLET AT BEDTIME ASNEEDED FOR SLEEP   albuterol  (VENTOLIN  HFA) 108 (90 Base) MCG/ACT inhaler Inhale 2 puffs into the lungs every 6 (six) hours as needed for wheezing or shortness of breath. (Patient not taking: Reported on 04/17/2024)   No facility-administered encounter medications on file as of 04/17/2024.    Allergies  (verified) Amoxicillin-pot clavulanate, Etodolac, Morphine and codeine, Oxycontin [oxycodone hcl], Cephalexin, Metronidazole, and Sulfa antibiotics   History: Past Medical History:  Diagnosis Date   Allergy    Anxiety    Colon polyps    COVID-19    01/2022 home test no antivirals taken   Depression    History of Clostridioides difficile colitis 2014   History of endometriosis    History of Graves' disease    History of jaundice as a child    age 19   Hyperlipidemia    Hypothyroidism    Insomnia    OA (osteoarthritis)    Osteopenia    Plantar fasciitis    Pneumonia 09/13/2023   Rheumatoid arthritis (HCC)    Sigmoid diverticulosis 2008   Squamous cell carcinoma in situ 2017   Central chest   UTI (lower urinary tract infection)    Vitamin B 12 deficiency    Past Surgical History:  Procedure Laterality Date   ABDOMINAL HYSTERECTOMY  1975   Bilateral oophorectomy   CATARACT EXTRACTION Bilateral 2009   COLECTOMY Right 2006   COLONOSCOPY  2015   KNEE ARTHROSCOPY  2011   after knee replacement   REPLACEMENT TOTAL KNEE Right 2011   SKIN CANCER EXCISION  2017   TONSILLECTOMY AND ADENOIDECTOMY  1950   TOTAL KNEE ARTHROPLASTY Left 12/10/2019   Procedure: TOTAL KNEE ARTHROPLASTY;  Surgeon: Christie Cox, MD;  Location: [facility];  Service: Orthopedics;  Laterality: Left;  75 mins needed for length of case   Family History  Problem Relation Age of Onset   Hypertension Mother    Hypertension Sister    Mental illness Sister    Dementia Sister    Diabetes Brother    Cancer Brother        Kidney/Bladder   Dementia Father    Parkinson's disease Brother    Breast cancer Neg Hx    Social History   Socioeconomic History   Marital status: Widowed    Spouse name: Not on file   Number of children: Not on file   Years of education: Not on file   Highest education level: Not on file  Occupational History   Not on file  Tobacco Use   Smoking status: Never   Smokeless tobacco: Never   Vaping Use   Vaping status: Never Used  Substance and Sexual Activity   Alcohol use: No   Drug use: No   Sexual activity: Yes    Birth control/protection: None  Other Topics Concern   Not on file  Social History Narrative   Has a living will scanned into chart.   Social Drivers of Corporate investment banker Strain: Low Risk  (04/17/2024)   Overall Financial Resource Strain (CARDIA)    Difficulty of Paying Living Expenses: Not hard at all  Food Insecurity: No Food Insecurity (04/17/2024)   Hunger Vital Sign    Worried About Running Out of Food in the Last Year: Never true    Ran Out of Food in the Last Year: Never true  Transportation Needs: No Transportation Needs (  04/17/2024)   PRAPARE - Administrator, Civil Service (Medical): No    Lack of Transportation (Non-Medical): No  Physical Activity: Inactive (04/17/2024)   Exercise Vital Sign    Days of Exercise per Week: 0 days    Minutes of Exercise per Session: 0 min  Stress: No Stress Concern Present (04/17/2024)   [employer] of Occupational Health - Occupational Stress Questionnaire    Feeling of Stress : Not at all  Social Connections: Moderately Integrated (04/17/2024)   Social Connection and Isolation Panel [NHANES]    Frequency of Communication with Friends and Family: More than three times a week    Frequency of Social Gatherings with Friends and Family: More than three times a week    Attends Religious Services: More than 4 times per year    Active Member of [employer] or Organizations: Yes    Attends Banker Meetings: More than 4 times per year    Marital Status: Widowed    Tobacco Counseling Counseling given: Not Answered    Clinical Intake:  Pre-visit preparation completed: Yes  Pain : No/denies pain     BMI - recorded: 22.4 Nutritional Status: BMI of 19-24  Normal Nutritional Risks: None Diabetes: No  No results found for: "HGBA1C"   How often do you need to have someone  help you when you read instructions, pamphlets, or other written materials from your doctor or pharmacy?: 1 - Never  Interpreter Needed?: No  Information entered by :: R. Rama Sorci LPN   Activities of Daily Living     04/17/2024    1:02 PM  In your present state of health, do you have any difficulty performing the following activities:  Hearing? 0  Vision? 0  Comment glasses  Difficulty concentrating or making decisions? 0  Walking or climbing stairs? 0  Dressing or bathing? 0  Doing errands, shopping? 0  Preparing Food and eating ? N  Using the Toilet? N  In the past six months, have you accidently leaked urine? Y  Do you have problems with loss of bowel control? N  Managing your Medications? N  Managing your Finances? N  Housekeeping or managing your Housekeeping? N    Patient Care Team: Thersia Flax, MD as PCP - General (Internal Medicine) Dasher, Margette Sheldon, MD as Attending Physician (Dermatology) Clair Crews, MD as Referring Physician (Ophthalmology) Geraline Knapp, MD (Urology)  Indicate any recent Medical Services you may have received from other than [facility] providers in the past year (date may be approximate).     Assessment:    This is a routine wellness examination for [place].  Hearing/Vision screen Hearing Screening - Comments:: No issues Vision Screening - Comments:: glasses   Goals Addressed             This Visit's Progress    Patient Stated       Wants to start back going to he gym       Depression Screen     04/17/2024    1:09 PM 11/14/2023    1:05 PM 09/16/2023    9:44 AM 09/13/2023   11:44 AM 08/30/2023   11:08 AM 05/30/2023    1:53 PM 04/15/2023    1:55 PM  PHQ 2/9 Scores  PHQ - 2 Score 0 0 0 0 0 0 0  PHQ- 9 Score 0 0 0 0 0 0     Fall Risk     04/17/2024    1:03 PM 11/14/2023  1:05 PM 09/16/2023    9:44 AM 09/13/2023   11:44 AM 08/30/2023   11:08 AM  Fall Risk   Falls in the past year? 1 0 0 0 0  Number falls in past yr: 0  0 0 0 0  Injury with Fall? 0 0 0 0 0  Risk for fall due to : Impaired balance/gait;History of fall(s) No Fall Risks No Fall Risks No Fall Risks No Fall Risks  Risk for fall due to: Comment fell off ladder., missed a step      Follow up Falls evaluation completed;Falls prevention discussed Falls evaluation completed Falls evaluation completed Falls evaluation completed Falls evaluation completed    MEDICARE RISK AT HOME:  Medicare Risk at Home Any stairs in or around the home?: Yes If so, are there any without handrails?: Yes (coming into the house) Home free of loose throw rugs in walkways, pet beds, electrical cords, etc?: Yes Adequate lighting in your home to reduce risk of falls?: Yes Life alert?: No Use of a cane, walker or w/c?: No Grab bars in the bathroom?: No Shower chair or bench in shower?: No Elevated toilet seat or a handicapped toilet?: Yes  TIMED UP AND GO:  Was the test performed?  No  Cognitive Function: 6CIT completed    08/31/2018   11:57 AM  MMSE - Mini Mental State Exam  Orientation to time 5  Orientation to Place 5  Registration 3  Attention/ Calculation 5  Recall 3  Language- name 2 objects 2  Language- repeat 1  Language- follow 3 step command 3  Language- read & follow direction 1  Write a sentence 1  Copy design 1  Total score 30        04/17/2024    1:18 PM 04/15/2023    1:56 PM 11/27/2019    9:05 AM  6CIT Screen  What Year? 0 points 0 points 0 points  What month? 0 points 0 points 0 points  What time? 0 points 0 points 0 points  Count back from 20 0 points 2 points 0 points  Months in reverse 0 points 0 points 0 points  Repeat phrase 0 points 2 points 0 points  Total Score 0 points 4 points 0 points    Immunizations Immunization History  Administered Date(s) Administered   Fluad Quad(high Dose 65+) 12/13/2023   Influenza Split 09/23/2021, 09/10/2022   Influenza, High Dose Seasonal PF 08/31/2018, 08/20/2019   Influenza-Unspecified  08/06/2014, 08/25/2015, 09/02/2016, 08/27/2020   PFIZER(Purple Top)SARS-COV-2 Vaccination 12/27/2019, 01/17/2020, 08/21/2020, 04/11/2021   Pfizer Covid-19 Vaccine Bivalent Booster 46yrs & up 07/08/2022   Pneumococcal Conjugate-13 06/17/2015   Pneumococcal Polysaccharide-23 05/29/2009   Td 12/05/2006   Zoster Recombinant(Shingrix) 09/28/2018, 12/02/2018   Zoster, Live 12/21/2007    Screening Tests Health Maintenance  Topic Date Due   DTaP/Tdap/Td (2 - Tdap) 12/05/2016   Medicare Annual Wellness (AWV)  04/14/2024   COVID-19 Vaccine (6 - 2024-25 season) 12/05/2024 (Originally 08/07/2023)   INFLUENZA VACCINE  07/06/2024   Pneumonia Vaccine 45+ Years old  Completed   DEXA SCAN  Completed   Zoster Vaccines- Shingrix  Completed   HPV VACCINES  Aged Out   Meningococcal B Vaccine  Aged Out    Health Maintenance  Health Maintenance Due  Topic Date Due   DTaP/Tdap/Td (2 - Tdap) 12/05/2016   Medicare Annual Wellness (AWV)  04/14/2024   Health Maintenance Items Addressed: Discussed the need to update tetanus vaccine (Tdap)  Additional Screening:  Vision Screening: Recommended  annual ophthalmology exams for early detection of glaucoma and other disorders of the eye. Up to date [facility]  Dental Screening: Recommended annual dental exams for proper oral hygiene  Community Resource Referral / Chronic Care Management: CRR required this visit?  No   CCM required this visit?  No   Plan:    I have personally reviewed and noted the following in the patient's chart:   Medical and social history Use of alcohol, tobacco or illicit drugs  Current medications and supplements including opioid prescriptions. Patient is not currently taking opioid prescriptions. Functional ability and status Nutritional status Physical activity Advanced directives List of other physicians Hospitalizations, surgeries, and ER visits in previous 12 months Vitals Screenings to include cognitive,  depression, and falls Referrals and appointments  In addition, I have reviewed and discussed with patient certain preventive protocols, quality metrics, and best practice recommendations. A written personalized care plan for preventive services as well as general preventive health recommendations were provided to patient.   Felicitas Horse, LPN   05/09/5408   After Visit Summary: (MyChart) Due to this being a telephonic visit, the after visit summary with patients personalized plan was offered to patient via MyChart   Notes: Nothing significant to report at this time.

## 2024-04-19 ENCOUNTER — Ambulatory Visit
Admission: RE | Admit: 2024-04-19 | Discharge: 2024-04-19 | Disposition: A | Discharge status: Home / Self Care | Source: Ambulatory Visit | Attending: Internal Medicine | Admitting: Internal Medicine

## 2024-04-19 DIAGNOSIS — Z1231 Encounter for screening mammogram for malignant neoplasm of breast: Secondary | ICD-10-CM | POA: Diagnosis not present

## 2024-05-17 ENCOUNTER — Other Ambulatory Visit: Payer: Self-pay

## 2024-05-17 DIAGNOSIS — M810 Age-related osteoporosis without current pathological fracture: Secondary | ICD-10-CM

## 2024-05-17 DIAGNOSIS — E039 Hypothyroidism, unspecified: Secondary | ICD-10-CM

## 2024-05-17 MED ORDER — LEVOTHYROXINE SODIUM 100 MCG PO TABS: 100.0000 ug | ORAL_TABLET | Freq: Every day | ORAL | 0 refills | Status: DC

## 2024-05-31 DIAGNOSIS — L570 Actinic keratosis: Secondary | ICD-10-CM | POA: Diagnosis not present

## 2024-05-31 DIAGNOSIS — D2272 Melanocytic nevi of left lower limb, including hip: Secondary | ICD-10-CM | POA: Diagnosis not present

## 2024-05-31 DIAGNOSIS — D2271 Melanocytic nevi of right lower limb, including hip: Secondary | ICD-10-CM | POA: Diagnosis not present

## 2024-05-31 DIAGNOSIS — D2262 Melanocytic nevi of left upper limb, including shoulder: Secondary | ICD-10-CM | POA: Diagnosis not present

## 2024-05-31 DIAGNOSIS — Z85828 Personal history of other malignant neoplasm of skin: Secondary | ICD-10-CM | POA: Diagnosis not present

## 2024-05-31 DIAGNOSIS — X32XXXA Exposure to sunlight, initial encounter: Secondary | ICD-10-CM | POA: Diagnosis not present

## 2024-05-31 DIAGNOSIS — D225 Melanocytic nevi of trunk: Secondary | ICD-10-CM | POA: Diagnosis not present

## 2024-05-31 DIAGNOSIS — D2261 Melanocytic nevi of right upper limb, including shoulder: Secondary | ICD-10-CM | POA: Diagnosis not present

## 2024-06-19 DIAGNOSIS — H353132 Nonexudative age-related macular degeneration, bilateral, intermediate dry stage: Secondary | ICD-10-CM | POA: Diagnosis not present

## 2024-06-19 DIAGNOSIS — H26493 Other secondary cataract, bilateral: Secondary | ICD-10-CM | POA: Diagnosis not present

## 2024-06-20 ENCOUNTER — Ambulatory Visit: Payer: Medicare HMO | Admitting: Internal Medicine

## 2024-06-20 ENCOUNTER — Encounter: Payer: Self-pay | Admitting: Internal Medicine

## 2024-06-20 VITALS — BP 122/80 | HR 91 | Ht 67.0 in | Wt 146.4 lb

## 2024-06-20 DIAGNOSIS — Z780 Asymptomatic menopausal state: Secondary | ICD-10-CM | POA: Diagnosis not present

## 2024-06-20 DIAGNOSIS — M810 Age-related osteoporosis without current pathological fracture: Secondary | ICD-10-CM | POA: Diagnosis not present

## 2024-06-20 DIAGNOSIS — R7301 Impaired fasting glucose: Secondary | ICD-10-CM

## 2024-06-20 DIAGNOSIS — E039 Hypothyroidism, unspecified: Secondary | ICD-10-CM | POA: Diagnosis not present

## 2024-06-20 DIAGNOSIS — M8580 Other specified disorders of bone density and structure, unspecified site: Secondary | ICD-10-CM | POA: Diagnosis not present

## 2024-06-20 DIAGNOSIS — Z0000 Encounter for general adult medical examination without abnormal findings: Secondary | ICD-10-CM | POA: Diagnosis not present

## 2024-06-20 DIAGNOSIS — E785 Hyperlipidemia, unspecified: Secondary | ICD-10-CM

## 2024-06-20 LAB — LIPID PANEL
Cholesterol: 202 mg/dL — ABNORMAL HIGH (ref 0–200)
HDL: 51.6 mg/dL (ref >39.00)
LDL Cholesterol: 125 mg/dL — ABNORMAL HIGH (ref 0–99)
NonHDL: 150.15
Total CHOL/HDL Ratio: 4
Triglycerides: 128 mg/dL (ref 0.0–149.0)
VLDL: 25.6 mg/dL (ref 0.0–40.0)

## 2024-06-20 LAB — COMPREHENSIVE METABOLIC PANEL WITH GFR
ALT: 13 U/L (ref 0–35)
AST: 16 U/L (ref 0–37)
Albumin: 4.3 g/dL (ref 3.5–5.2)
Alkaline Phosphatase: 46 U/L (ref 39–117)
BUN: 13 mg/dL (ref 6–23)
CO2: 29 meq/L (ref 19–32)
Calcium: 9.2 mg/dL (ref 8.4–10.5)
Chloride: 103 meq/L (ref 96–112)
Creatinine, Ser: 0.96 mg/dL (ref 0.40–1.20)
GFR: 53.8 mL/min — ABNORMAL LOW (ref >60.00)
Glucose, Bld: 96 mg/dL (ref 70–99)
Potassium: 4.4 meq/L (ref 3.5–5.1)
Sodium: 140 meq/L (ref 135–145)
Total Bilirubin: 0.5 mg/dL (ref 0.2–1.2)
Total Protein: 6.4 g/dL (ref 6.0–8.3)

## 2024-06-20 LAB — LDL CHOLESTEROL, DIRECT: Direct LDL: 125 mg/dL

## 2024-06-20 LAB — HEMOGLOBIN A1C: Hgb A1c MFr Bld: 5.7 % (ref 4.6–6.5)

## 2024-06-20 LAB — TSH: TSH: 1.19 u[IU]/mL (ref 0.35–5.50)

## 2024-06-20 MED ORDER — TRAZODONE HCL 50 MG PO TABS: ORAL_TABLET | ORAL | 1 refills | Status: DC

## 2024-06-20 NOTE — Assessment & Plan Note (Signed)

## 2024-06-20 NOTE — Progress Notes (Addendum)
 Patient ID: Lisa Petersen, female    DOB: 18-Feb-1936  Age: 88 y.o. MRN: [id number]  The patient is here for annual preventive examination and management of other chronic and acute problems.   The risk factors are reflected in the social history.   The roster of all physicians providing medical care to patient - is listed in the Snapshot section of the chart.   Activities of daily living:  The patient is 100% independent in all ADLs: dressing, toileting, feeding as well as independent mobility   Home safety : The patient has smoke detectors in the home. They wear seatbelts.  There are no unsecured firearms at home. There is no violence in the home.    There is no risks for hepatitis, STDs or HIV. There is no   history of blood transfusion. They have no travel history to infectious disease endemic areas of the world.   The patient has seen their dentist in the last six month. They have seen their eye doctor in the last year. The patinet  denies slight hearing difficulty with regard to whispered voices and some television programs.  They have deferred audiologic testing in the last year.  They do not  have excessive sun exposure. Discussed the need for sun protection: hats, long sleeves and use of sunscreen if there is significant sun exposure.    Diet: the importance of a healthy diet is discussed. They do have a healthy diet.   The benefits of regular aerobic exercise were discussed. The patient  exercises  3 to 5 days per week  for  60 minutes.    Depression screen: there are no signs or vegative symptoms of depression- irritability, change in appetite, anhedonia, sadness/tearfullness.   The following portions of the patient's history were reviewed and updated as appropriate: allergies, current medications, past family history, past medical history,  past surgical history, past social history  and problem list.   Visual acuity was not assessed per patient preference since the patient has  regular follow up with an  ophthalmologist. Hearing and body mass index were assessed and reviewed.    During the course of the visit the patient was educated and counseled about appropriate screening and preventive services including : fall prevention , diabetes screening, nutrition counseling, colorectal cancer screening, and recommended immunizations.    Chief Complaint:  Vision loss in left eye dye to cloudy lense,  laser surgery planned  Fatigue;  noted during trip with her son to [place] and [place]. The tip included  hours of  walking .  After 2 hours of walking uphill to a castle.  Was not short of breath,  did not have chest pain.  Still ides her lawn mowever and does other yard work without symptoms       Review of Symptoms  Patient denies headache, fevers, malaise, unintentional weight loss, skin rash, eye pain, sinus congestion and sinus pain, sore throat, dysphagia,  hemoptysis , cough, dyspnea, wheezing, chest pain, palpitations, orthopnea, edema, abdominal pain, nausea, melena, diarrhea, constipation, flank pain, dysuria, hematuria, urinary  Frequency, nocturia, numbness, tingling, seizures,  Focal weakness, Loss of consciousness,  Tremor, insomnia, depression, anxiety, and suicidal ideation.    Physical Exam:  BP 122/80   Pulse 91   Ht 5' 7 (1.702 m)   Wt 146 lb 6.4 oz (66.4 kg)   SpO2 97%   BMI 22.93 kg/m    Physical Exam Vitals reviewed.  Constitutional:      General: She is  not in acute distress.    Appearance: Normal appearance. She is normal weight. She is not ill-appearing, toxic-appearing or diaphoretic.  HENT:     Head: Normocephalic.  Eyes:     General: No scleral icterus.       Right eye: No discharge.        Left eye: No discharge.     Conjunctiva/sclera: Conjunctivae normal.  Cardiovascular:     Rate and Rhythm: Normal rate and regular rhythm.     Heart sounds: Normal heart sounds.  Pulmonary:     Effort: Pulmonary effort is normal. No  respiratory distress.     Breath sounds: Normal breath sounds.  Musculoskeletal:        General: Normal range of motion.  Skin:    General: Skin is warm and dry.  Neurological:     General: No focal deficit present.     Mental Status: She is alert and oriented to person, place, and time. Mental status is at baseline.  Psychiatric:        Mood and Affect: Mood normal.        Behavior: Behavior normal.        Thought Content: Thought content normal.        Judgment: Judgment normal.     Assessment and Plan: Routine general medical examination at a health care facility Assessment & Plan: age appropriate education and counseling updated, referrals for preventative services and immunizations addressed, dietary and smoking counseling addressed, most recent labs reviewed.  I have personally reviewed and have noted:   1) the patient's medical and social history 2) The pt's use of alcohol, tobacco, and illicit drugs 3) The patient's current medications and supplements 4) Functional ability including ADL's, fall risk, home safety risk, hearing and visual impairment 5) Diet and physical activities 6) Evidence for depression or mood disorder 7) The patient's height, weight, and BMI have been recorded in the chart   I have made referrals, and provided counseling and education based on review of the above    Hypothyroidism, unspecified type Assessment & Plan: Thyroid  function is WNL on current levothyroxine  dose of 100 mcg daily .  No current changes needed.    Orders: -     TSH  Hyperlipidemia, unspecified hyperlipidemia type -     Lipid panel -     LDL cholesterol, direct  Impaired fasting glucose -     Comprehensive metabolic panel with GFR -     Hemoglobin A1c  Osteopenia after menopause -     DG Bone Density; Future  Age-related osteoporosis without current pathological fracture Assessment & Plan: Managed with weekly  alendronate  therapy started in Jan 2025 for elevated  FRAX score by 2023 DEXA .  Renal function is normal.  Will repeat DEXA in 2026     No follow-ups on file.  Verneita LITTIE Kettering, MD

## 2024-06-20 NOTE — Patient Instructions (Addendum)
 Exercise: Anything that gets your heart beating faster counts! This means activities as simple as brisk walking or doing yard work count as exercise. I recommend that you get 150 minutes of moderate-intensity aerobic activity per week. Moderate-intensity means that are breathing harder and your heart is beating faster. You should be able to talk, but not sing the words to your favorite song. You can split up the 150 minutes however you want. For example, you could take a brisk, thirty minute walk five days a week. I also recommend that on two or more days a week that you do muscle-strengthening activities that work all major muscle groups.   Some insurance companies cover a program called Silver Sneakers. Silver Sneakers includes a free memberships to gyms, exercise classes and fitness groups. You can check your eligibility at [email or web address].      Your  DEXA scan  been ordered.  You are encouraged (required) to call to schedule your own  appointment at [facility]  , and their phone number is [phone]

## 2024-06-21 ENCOUNTER — Ambulatory Visit: Payer: Self-pay | Admitting: Internal Medicine

## 2024-06-21 NOTE — Assessment & Plan Note (Addendum)
 Managed with weekly  alendronate  therapy started in Jan 2025 for elevated FRAX score by 2023 DEXA .  Renal function is normal.  Will repeat DEXA in 2026

## 2024-06-21 NOTE — Assessment & Plan Note (Signed)
 Thyroid  function is WNL on current levothyroxine  dose of 100 mcg daily .  No current changes needed.

## 2024-06-25 DIAGNOSIS — H353131 Nonexudative age-related macular degeneration, bilateral, early dry stage: Secondary | ICD-10-CM | POA: Diagnosis not present

## 2024-06-25 DIAGNOSIS — H26492 Other secondary cataract, left eye: Secondary | ICD-10-CM | POA: Diagnosis not present

## 2024-06-25 DIAGNOSIS — H26493 Other secondary cataract, bilateral: Secondary | ICD-10-CM | POA: Diagnosis not present

## 2024-06-26 ENCOUNTER — Ambulatory Visit
Admission: RE | Admit: 2024-06-26 | Discharge: 2024-06-26 | Disposition: A | Discharge status: Home / Self Care | Source: Ambulatory Visit | Attending: Urology | Admitting: Urology

## 2024-06-26 DIAGNOSIS — R310 Gross hematuria: Secondary | ICD-10-CM | POA: Insufficient documentation

## 2024-06-26 DIAGNOSIS — N200 Calculus of kidney: Secondary | ICD-10-CM | POA: Diagnosis not present

## 2024-06-29 ENCOUNTER — Ambulatory Visit: Payer: Self-pay | Admitting: Urology

## 2024-07-09 DIAGNOSIS — H26491 Other secondary cataract, right eye: Secondary | ICD-10-CM | POA: Diagnosis not present

## 2024-07-09 DIAGNOSIS — Z961 Presence of intraocular lens: Secondary | ICD-10-CM | POA: Diagnosis not present

## 2024-07-16 ENCOUNTER — Encounter: Payer: Medicare HMO | Admitting: Family Medicine

## 2024-07-17 ENCOUNTER — Ambulatory Visit: Admitting: Urology

## 2024-07-17 ENCOUNTER — Encounter: Payer: Self-pay | Admitting: Urology

## 2024-07-17 VITALS — BP 149/79 | HR 78 | Ht 67.0 in | Wt 145.0 lb

## 2024-07-17 DIAGNOSIS — N200 Calculus of kidney: Secondary | ICD-10-CM | POA: Diagnosis not present

## 2024-07-17 NOTE — Progress Notes (Signed)
 07/17/2024 9:58 AM   Lisa Petersen 02-06-1936 [id number]  Referring provider: Maribeth Camellia MATSU, MD [address] [place],  [place] [address]  Chief Complaint  Patient presents with   Nephrolithiasis   Urologic history:  1.  Gross hematuria Episode total gross painless hematuria September 2022 Cystoscopy unremarkable; CTU 4 mm nonobstructing left lower pole renal calculus and bilateral renal cysts   2.  Left nephrolithiasis Asymptomatic   HPI: Lisa Petersen is a 88 y.o. female for annual follow-up.  No complaints since last visit Denies flank, abdominal, pelvic pain No dysuria or gross hematuria   PMH: Past Medical History:  Diagnosis Date   Allergy    Anxiety    Colon polyps    COVID-19    01/2022 home test no antivirals taken   Depression    History of Clostridioides difficile colitis 2014   History of endometriosis    History of Graves' disease    History of jaundice as a child    age 26   Hyperlipidemia    Hypothyroidism    Insomnia    OA (osteoarthritis)    Osteopenia    Plantar fasciitis    Pneumonia 09/13/2023   Rheumatoid arthritis (HCC)    Sigmoid diverticulosis 2008   Squamous cell carcinoma in situ 2017   Central chest   UTI (lower urinary tract infection)    Vitamin B 12 deficiency     Surgical History: Past Surgical History:  Procedure Laterality Date   ABDOMINAL HYSTERECTOMY  1975   Bilateral oophorectomy   CATARACT EXTRACTION Bilateral 2009   COLECTOMY Right 2006   COLONOSCOPY  2015   KNEE ARTHROSCOPY  2011   after knee replacement   REPLACEMENT TOTAL KNEE Right 2011   SKIN CANCER EXCISION  2017   TONSILLECTOMY AND ADENOIDECTOMY  1950   TOTAL KNEE ARTHROPLASTY Left 12/10/2019   Procedure: TOTAL KNEE ARTHROPLASTY;  Surgeon: Rubie Kemps, MD;  Location: [facility];  Service: Orthopedics;  Laterality: Left;  75 mins needed for length of case    Home Medications:  Allergies as of 07/17/2024       Reactions    Amoxicillin-pot Clavulanate Diarrhea   Etodolac Other (See Comments)   May have raised transaminases   Morphine And Codeine Nausea And Vomiting   Oxycontin [oxycodone Hcl] Nausea Only   Cephalexin Rash   Metronidazole Nausea Only, Other (See Comments)   dyspepsia   Sulfa Antibiotics Rash        Medication List        Accurate as of July 17, 2024  9:58 AM. If you have any questions, ask your nurse or doctor.          albuterol  108 (90 Base) MCG/ACT inhaler Commonly known as: VENTOLIN  HFA Inhale 2 puffs into the lungs every 6 (six) hours as needed for wheezing or shortness of breath.   alendronate  70 MG tablet Commonly known as: FOSAMAX  Take 1 tablet (70 mg total) by mouth every 7 (seven) days. Take with a full glass of water  on an empty stomach.   CALTRATE 600+D PO Take 2 tablets by mouth daily.   PRESERVISION AREDS 2+MULTI VIT PO Take 1 capsule by mouth 2 (two) times daily.   CENTRUM SILVER PO Take 1 tablet by mouth daily.   levothyroxine  100 MCG tablet Commonly known as: SYNTHROID  Take 1 tablet (100 mcg total) by mouth daily before breakfast.   OSTEO BI-FLEX JOINT SHIELD PO Take 1 tablet by mouth 2 (  two) times daily.   traZODone  50 MG tablet Commonly known as: DESYREL  TAKE 1 TABLET AT BEDTIME ASNEEDED FOR SLEEP   VITAMIN B-12 PO Take 2,500 mcg by mouth daily.        Allergies:  Allergies  Allergen Reactions   Amoxicillin-Pot Clavulanate Diarrhea   Etodolac Other (See Comments)    May have raised transaminases     Morphine And Codeine Nausea And Vomiting   Oxycontin [Oxycodone Hcl] Nausea Only   Cephalexin Rash   Metronidazole Nausea Only and Other (See Comments)    dyspepsia    Sulfa Antibiotics Rash    Family History: Family History  Problem Relation Age of Onset   Hypertension Mother    Hypertension Sister    Mental illness Sister    Dementia Sister    Diabetes Brother    Cancer Brother        Kidney/Bladder   Dementia Father     Parkinson's disease Brother    Breast cancer Neg Hx     Social History:  reports that she has never smoked. She has never used smokeless tobacco. She reports that she does not drink alcohol and does not use drugs.   Physical Exam: BP (!) 149/79   Pulse 78   Ht 5' 7 (1.702 m)   Wt 145 lb (65.8 kg)   BMI 22.71 kg/m   Constitutional:  Alert and oriented, No acute distress. HEENT: [place] AT Respiratory: Normal respiratory effort, no increased work of breathing. Psychiatric: Normal mood and affect.    Pertinent Imaging: KUB performed 06/26/2024 was personally reviewed and interpreted.  No calcifications identified suspicious for urinary tract stones    Assessment & Plan:    1.  Left nephrolithiasis Calculus not visualized on KUB Asymptomatic Will move to 2-year follow-up with KUB Call earlier for flank/abdominal pain/renal colic   Lisa JAYSON Barba, MD  [facility] [address], [address] [place], [place] [address] [phone]

## 2024-07-20 ENCOUNTER — Other Ambulatory Visit: Payer: Self-pay | Admitting: Cardiovascular Disease

## 2024-07-26 ENCOUNTER — Ambulatory Visit (INDEPENDENT_AMBULATORY_CARE_PROVIDER_SITE_OTHER): Admitting: Internal Medicine

## 2024-07-26 ENCOUNTER — Encounter: Payer: Self-pay | Admitting: Internal Medicine

## 2024-07-26 ENCOUNTER — Ambulatory Visit: Payer: Self-pay

## 2024-07-26 VITALS — BP 122/76 | HR 65 | Ht 67.0 in | Wt 146.4 lb

## 2024-07-26 DIAGNOSIS — E559 Vitamin D deficiency, unspecified: Secondary | ICD-10-CM

## 2024-07-26 DIAGNOSIS — M810 Age-related osteoporosis without current pathological fracture: Secondary | ICD-10-CM | POA: Diagnosis not present

## 2024-07-26 DIAGNOSIS — J0100 Acute maxillary sinusitis, unspecified: Secondary | ICD-10-CM | POA: Diagnosis not present

## 2024-07-26 DIAGNOSIS — E039 Hypothyroidism, unspecified: Secondary | ICD-10-CM

## 2024-07-26 DIAGNOSIS — Z79899 Other long term (current) drug therapy: Secondary | ICD-10-CM | POA: Diagnosis not present

## 2024-07-26 MED ORDER — PREDNISONE 10 MG PO TABS: ORAL_TABLET | ORAL | 0 refills | Status: DC

## 2024-07-26 MED ORDER — AMOXICILLIN-POT CLAVULANATE 875-125 MG PO TABS: 1.0000 | ORAL_TABLET | Freq: Two times a day (BID) | ORAL | 0 refills | Status: DC

## 2024-07-26 MED ORDER — LEVOTHYROXINE SODIUM 100 MCG PO TABS: 100.0000 ug | ORAL_TABLET | Freq: Every day | ORAL | 1 refills | Status: DC

## 2024-07-26 NOTE — Patient Instructions (Signed)
 I am treating you for bacterial sinusitis   I am prescribing an antibiotic (augmentin  )   To manage the infection . Take it with a full meal morning and evening for 7 days . 4  PLEASE USE GENERIC AFRIN NASAL SPRAY FOR UP TO 5 DAYS IF YOUR SINUSES ARE CONGESTED  CONTIUE TYLENOL  FOR BODY ACHES AND MUCINEX TO THIN OUT THE MUCUS  ADD THE PREDNISONE  IF YOU DEVELOP PRESSURE IN THE EARS    Please take a probiotic ( Align, Flora que or Culturelle) OR eat yogurt every day  for three weeks since you are taking an  antibiotic to prevent a very serious antibiotic associated infection  Called clostridium dificile colitis that can cause diarrhea  If you develop profuse watery diarrhea in spite of the probiotic let us  know so we can test you for c dif

## 2024-07-26 NOTE — Telephone Encounter (Signed)
 Spoke with pt and scheduled her for an appt at 1:30 per Dr. Tullo's okay

## 2024-07-26 NOTE — Telephone Encounter (Signed)
 FYI Only or Action Required?: Action required by provider: request for appointment.  Patient was last seen in primary care on 06/20/2024 by Marylynn Verneita CROME, MD.  Called Nurse Triage reporting Sinusitis.  Symptoms began several days ago.  Interventions attempted: OTC medications: Mucinex, Tylenol  and Rest, hydration, or home remedies.  Symptoms are: gradually worsening.  Triage Disposition: See HCP Within 4 Hours (Or PCP Triage)  Patient/caregiver understands and will follow disposition?: No, refuses disposition  Copied from CRM [id number]. Topic: Clinical - Red Word Triage >> Jul 26, 2024 10:21 AM Charolett L wrote: [facility] that prompted transfer to Nurse Triage: Sinus infection, nose bleeding, headache Reason for Disposition  [1] Redness or swelling on the cheek, forehead or around the eye AND [2] no fever  Answer Assessment - Initial Assessment Questions 1. LOCATION: Where does it hurt?      Facial-across bridge of nose, across forehead 2. ONSET: When did the sinus pain start?  (e.g., hours, days)      Saturday 3. SEVERITY: How bad is the pain?   (Scale 0-10; or none, mild, moderate or severe)     Severe 4. RECURRENT SYMPTOM: Have you ever had sinus problems before? If Yes, ask: When was the last time? and What happened that time?       5. NASAL CONGESTION: Is the nose blocked? If Yes, ask: Can you open it or must you breathe through your mouth?     congestion 6. NASAL DISCHARGE: Do you have discharge from your nose? If so ask, What color?     Yes, blood tinged. Not actively bleeding at this time 7. FEVER: Do you have a fever? If Yes, ask: What is it, how was it measured, and when did it start?      Elevated since Tuesday, today 99.1 baseline is 97.0 8. OTHER SYMPTOMS: Do you have any other symptoms? (e.g., sore throat, cough, earache, difficulty breathing)     Cough, headache  Additional info: 1) Covid negative-swab was bloody. 2) Next available  appointment at practice location is not until August 07 2024, advised urgent care for evaluation and treatment, she refuses urgent care and insisting on work in to [employer] schedule. Please advise.  Protocols used: Sinus Pain or Congestion-A-AH

## 2024-07-26 NOTE — Progress Notes (Signed)
 Subjective:  Patient ID: Lisa Petersen, female    DOB: 04/24/1936  Age: 88 y.o. MRN: [id number]  CC: The primary encounter diagnosis was Acute non-recurrent maxillary sinusitis. Diagnoses of Hypothyroidism, unspecified type, Age-related osteoporosis without current pathological fracture, Vitamin D  deficiency, and Long-term current use of high risk medication other than anticoagulant were also pertinent to this visit.   HPI Lisa Petersen presents for  Chief Complaint  Patient presents with   Cough    Cough, congestion, sinus pain, headache, body aches started on Saturday. Pt has been taking Mucinex, and Tylenol . Last dose of Tylenol  was at 11 am.    88 yr old female with history of hypothyrodisn, osteoporosis and osteoarthritis  presents with  upper respiratory infection . Symptoms started 5 days ago with pharyngitis, , headache , body aches and malaise,  followed by sinus pain  purulent drainage and facial pain . Has been using OTC meds,  and a home COVID test was negative..  she denies any recent travel or sick contacts..  She has not had shortness of breath, wheezing, cough,  or diarrhea .  She lives alone  and is IADL     Outpatient Medications Prior to Visit  Medication Sig Dispense Refill   albuterol  (VENTOLIN  HFA) 108 (90 Base) MCG/ACT inhaler Inhale 2 puffs into the lungs every 6 (six) hours as needed for wheezing or shortness of breath. 8 g 2   alendronate  (FOSAMAX ) 70 MG tablet Take 1 tablet (70 mg total) by mouth every 7 (seven) days. Take with a full glass of water  on an empty stomach. 12 tablet 3   Calcium Carbonate-Vitamin D  (CALTRATE 600+D PO) Take 2 tablets by mouth daily.      Cyanocobalamin (VITAMIN B-12 PO) Take 2,500 mcg by mouth daily.      Misc Natural Products (OSTEO BI-FLEX JOINT SHIELD PO) Take 1 tablet by mouth 2 (two) times daily.      Multiple Vitamins-Minerals (CENTRUM SILVER PO) Take 1 tablet by mouth daily.      Multiple Vitamins-Minerals (PRESERVISION  AREDS 2+MULTI VIT PO) Take 1 capsule by mouth 2 (two) times daily.     traZODone  (DESYREL ) 50 MG tablet TAKE 1 TABLET AT BEDTIME ASNEEDED FOR SLEEP 90 tablet 1   levothyroxine  (SYNTHROID ) 100 MCG tablet Take 1 tablet (100 mcg total) by mouth daily before breakfast. 90 tablet 0   No facility-administered medications prior to visit.    Review of Systems;  Patient denies  unintentional weight loss, skin rash, eye pain, , dysphagia,  hemoptysis , cough, dyspnea, wheezing, chest pain, palpitations, orthopnea, edema, abdominal pain, nausea, melena, diarrhea, constipation, flank pain, dysuria, hematuria, urinary  Frequency, nocturia, numbness, tingling, seizures,  Focal weakness, Loss of consciousness,  Tremor, insomnia, depression, anxiety, and suicidal ideation.      Objective:  BP 122/76   Pulse 65   Ht 5' 7 (1.702 m)   Wt 146 lb 7 oz (66.4 kg)   SpO2 96%   BMI 22.94 kg/m   BP Readings from Last 3 Encounters:  07/26/24 122/76  07/17/24 (!) 149/79  06/20/24 122/80    Wt Readings from Last 3 Encounters:  07/26/24 146 lb 7 oz (66.4 kg)  07/17/24 145 lb (65.8 kg)  06/20/24 146 lb 6.4 oz (66.4 kg)    Physical Exam Vitals reviewed.  Constitutional:      General: She is not in acute distress.    Appearance: Normal appearance. She is normal weight. She is not ill-appearing, toxic-appearing  or diaphoretic.  HENT:     Head: Normocephalic.     Right Ear: Tympanic membrane normal.     Left Ear: Tympanic membrane normal.     Nose: Nasal tenderness, mucosal edema, congestion and rhinorrhea present.     Right Turbinates: Swollen.     Left Turbinates: Swollen.     Right Sinus: Maxillary sinus tenderness present.     Left Sinus: Maxillary sinus tenderness present.     Mouth/Throat:     Mouth: Mucous membranes are moist.     Pharynx: No oropharyngeal exudate or posterior oropharyngeal erythema.  Eyes:     General: No scleral icterus.       Right eye: No discharge.        Left eye: No  discharge.     Conjunctiva/sclera: Conjunctivae normal.  Cardiovascular:     Rate and Rhythm: Normal rate and regular rhythm.     Heart sounds: Normal heart sounds.  Pulmonary:     Effort: Pulmonary effort is normal.     Breath sounds: Normal breath sounds.  Abdominal:     General: Bowel sounds are normal.     Palpations: Abdomen is soft.  Musculoskeletal:        General: Normal range of motion.  Skin:    General: Skin is warm and dry.  Neurological:     General: No focal deficit present.     Mental Status: She is alert and oriented to person, place, and time. Mental status is at baseline.  Psychiatric:        Mood and Affect: Mood normal.        Behavior: Behavior normal.        Thought Content: Thought content normal.        Judgment: Judgment normal.     Lab Results  Component Value Date   HGBA1C 5.7 06/20/2024    Lab Results  Component Value Date   CREATININE 0.96 06/20/2024   CREATININE 0.84 12/21/2023   CREATININE 0.92 09/13/2023    Lab Results  Component Value Date   WBC 5.6 11/14/2023   HGB 13.3 11/14/2023   HCT 40.1 11/14/2023   PLT 222.0 11/14/2023   GLUCOSE 96 06/20/2024   CHOL 202 (H) 06/20/2024   TRIG 128.0 06/20/2024   HDL 51.60 06/20/2024   LDLDIRECT 125.0 06/20/2024   LDLCALC 125 (H) 06/20/2024   ALT 13 06/20/2024   AST 16 06/20/2024   NA 140 06/20/2024   K 4.4 06/20/2024   CL 103 06/20/2024   CREATININE 0.96 06/20/2024   BUN 13 06/20/2024   CO2 29 06/20/2024   TSH 1.19 06/20/2024   INR 0.87 03/26/2011   HGBA1C 5.7 06/20/2024    DG Abd 1 View Result Date: 06/28/2024 CLINICAL DATA:  Gross hematuria. EXAM: ABDOMEN - 1 VIEW COMPARISON:  July 01, 2023. FINDINGS: No abnormal bowel dilatation. Surgical clip seen in right side of abdomen. Probable phleboliths seen in the pelvis. No definite nephrolithiasis is noted. IMPRESSION: No definite nephrolithiasis. Electronically Signed   By: Lynwood Landy Raddle M.D.   On: 06/28/2024 16:25    Assessment  & Plan:  .Acute non-recurrent maxillary sinusitis Assessment & Plan: Given chronicity of symptoms, development of facial pain and exam consistent with bacterial URI,  Will treat with empiric antibiotics, prednisone  taper, decongestants, and saline lavage.  Advised to take a probiotic for 3 weeks    Orders: -     Amoxicillin -Pot Clavulanate; Take 1 tablet by mouth 2 (two) times daily.  Dispense:  14 tablet; Refill: 0 -     predniSONE ; 6 tablets on Day 1 , then reduce by 1 tablet daily until gone  Dispense: 21 tablet; Refill: 0  Hypothyroidism, unspecified type -     Levothyroxine  Sodium; Take 1 tablet (100 mcg total) by mouth daily before breakfast.  Dispense: 90 tablet; Refill: 1  Age-related osteoporosis without current pathological fracture Assessment & Plan: Managed with weekly  alendronate  therapy started in Jan 2015 for elevated FRAX score by 2023 DEXA .  Renal function is normal.  BMD has been decreasing despite treatment based on review of bi annual DEXAs,   but still in the mild osteopenia range.   last one in 2023.  Will repeat DEXA in the fall.  Reviewed calcium and vitamin d   levels,    vitamin d  level needed with next blood draw    Vitamin D  deficiency  Long-term current use of high risk medication other than anticoagulant    Follow-up: No follow-ups on file.   Verneita LITTIE Kettering, MD

## 2024-07-28 DIAGNOSIS — J0100 Acute maxillary sinusitis, unspecified: Secondary | ICD-10-CM | POA: Insufficient documentation

## 2024-07-28 NOTE — Assessment & Plan Note (Addendum)
 Managed with weekly  alendronate  therapy started in Jan 2015 for elevated FRAX score by 2023 DEXA .  Renal function is normal.  BMD has been decreasing despite treatment based on review of bi annual DEXAs,   but still in the mild osteopenia range.   last one in 2023.  Will repeat DEXA in the fall.  Reviewed calcium and vitamin d   levels,    vitamin d  level needed with next blood draw

## 2024-07-28 NOTE — Assessment & Plan Note (Signed)
Given chronicity of symptoms, development of facial pain and exam consistent with bacterial URI,  Will treat with empiric antibiotics, prednisone taper, decongestants, and saline lavage.  Advised to take a probiotic for 3 weeks  

## 2024-08-04 ENCOUNTER — Other Ambulatory Visit: Payer: Self-pay | Admitting: Internal Medicine

## 2024-08-04 DIAGNOSIS — E039 Hypothyroidism, unspecified: Secondary | ICD-10-CM

## 2024-08-07 DIAGNOSIS — J301 Allergic rhinitis due to pollen: Secondary | ICD-10-CM | POA: Diagnosis not present

## 2024-08-07 DIAGNOSIS — H6123 Impacted cerumen, bilateral: Secondary | ICD-10-CM | POA: Diagnosis not present

## 2024-08-07 DIAGNOSIS — H6063 Unspecified chronic otitis externa, bilateral: Secondary | ICD-10-CM | POA: Diagnosis not present

## 2024-09-30 ENCOUNTER — Ambulatory Visit
Admission: EM | Admit: 2024-09-30 | Discharge: 2024-09-30 | Disposition: A | Discharge status: Home / Self Care | Attending: Emergency Medicine | Admitting: Emergency Medicine

## 2024-09-30 ENCOUNTER — Encounter: Payer: Self-pay | Admitting: Emergency Medicine

## 2024-09-30 DIAGNOSIS — B349 Viral infection, unspecified: Secondary | ICD-10-CM

## 2024-09-30 DIAGNOSIS — J101 Influenza due to other identified influenza virus with other respiratory manifestations: Secondary | ICD-10-CM

## 2024-09-30 LAB — POC COVID19/FLU A&B COMBO
Covid Antigen, POC: NEGATIVE
Influenza A Antigen, POC: POSITIVE — AB
Influenza B Antigen, POC: NEGATIVE

## 2024-09-30 MED ORDER — OSELTAMIVIR PHOSPHATE 75 MG PO CAPS: 75.0000 mg | ORAL_CAPSULE | Freq: Two times a day (BID) | ORAL | 0 refills | Status: DC

## 2024-09-30 NOTE — Discharge Instructions (Addendum)
 Influenza A is a virus and should steadily improve in time it can take up to 7 to 10 days before you truly start to see a turnaround however things will get better  Begin Tamiflu every morning and every evening for 5 days to reduce the amount of virus in the body which helps to minimize symptoms    You can take Tylenol and/or Ibuprofen as needed for fever reduction and pain relief.   For cough: honey 1/2 to 1 teaspoon (you can dilute the honey in water or another fluid).  You can also use guaifenesin and dextromethorphan for cough. You can use a humidifier for chest congestion and cough.  If you don't have a humidifier, you can sit in the bathroom with the hot shower running.      For sore throat: try warm salt water gargles, cepacol lozenges, throat spray, warm tea or water with lemon/honey, popsicles or ice, or OTC cold relief medicine for throat discomfort.   For congestion: take a daily anti-histamine like Zyrtec, Claritin, and a oral decongestant, such as pseudoephedrine.  You can also use Flonase 1-2 sprays in each nostril daily.   It is important to stay hydrated: drink plenty of fluids (water, gatorade/powerade/pedialyte, juices, or teas) to keep your throat moisturized and help further relieve irritation/discomfort.

## 2024-09-30 NOTE — ED Triage Notes (Signed)
 Patient reports fever, body aches,  and cough x 2 days. Patient has taken Mucinex and Tylenol  with mild relief. Patient rates body aches 6/10.

## 2024-09-30 NOTE — ED Provider Notes (Signed)
 [facility]    CSN: [id number] Arrival date & time: 09/30/24  0800      History   Chief Complaint Chief Complaint  Patient presents with   Cough   Fever   Generalized Body Aches    HPI Lisa Petersen is a 88 y.o. female.   Patient presents for evaluation of a fever peaking at 101, generalized bodyaches, nasal congestion described as mild, nonproductive cough and intermittent headaches present for 2 days.  Known exposure to influenza.  Tolerable to food and liquids but appetite is decreased.  Has attempted use of Mucinex and Tylenol .  Past Medical History:  Diagnosis Date   Allergy    Anxiety    Colon polyps    COVID-19    01/2022 home test no antivirals taken   Depression    History of Clostridioides difficile colitis 2014   History of endometriosis    History of Graves' disease    History of jaundice as a child    age 65   Hyperlipidemia    Hypothyroidism    Insomnia    OA (osteoarthritis)    Osteopenia    Plantar fasciitis    Pneumonia 09/13/2023   Rheumatoid arthritis (HCC)    Sigmoid diverticulosis 2008   Squamous cell carcinoma in situ 2017   Central chest   UTI (lower urinary tract infection)    Vitamin B 12 deficiency     Patient Active Problem List   Diagnosis Date Noted   Sinusitis, acute maxillary 07/28/2024   Cardiomyopathy (HCC) 12/22/2023   Tricuspid regurgitation 12/22/2023   Parotid sialoadenitis 05/30/2023   Palpitations 01/10/2023   Age-related osteoporosis without current pathological fracture 10/08/2022   Hematuria 10/23/2021   Tinnitus of both ears 06/29/2021   Febrile respiratory illness 01/12/2021   Bilateral finger numbness 04/06/2017   Osteoarthritis 04/06/2017   Routine general medical examination at a health care facility 10/07/2016   Anxiety and depression 05/21/2016   History of squamous cell carcinoma of skin 04/07/2016   Insomnia 06/17/2015   Hypothyroidism 11/14/2014    Past Surgical History:  Procedure  Laterality Date   ABDOMINAL HYSTERECTOMY  1975   Bilateral oophorectomy   CATARACT EXTRACTION Bilateral 2009   COLECTOMY Right 2006   COLONOSCOPY  2015   KNEE ARTHROSCOPY  2011   after knee replacement   REPLACEMENT TOTAL KNEE Right 2011   SKIN CANCER EXCISION  2017   TONSILLECTOMY AND ADENOIDECTOMY  1950   TOTAL KNEE ARTHROPLASTY Left 12/10/2019   Procedure: TOTAL KNEE ARTHROPLASTY;  Surgeon: Rubie Kemps, MD;  Location: [facility];  Service: Orthopedics;  Laterality: Left;  75 mins needed for length of case    OB History   No obstetric history on file.      Home Medications    Prior to Admission medications   Medication Sig Start Date End Date Taking? Authorizing Provider  oseltamivir (TAMIFLU) 75 MG capsule Take 1 capsule (75 mg total) by mouth every 12 (twelve) hours. 09/30/24  Yes Carylon Tamburro, Shelba SAUNDERS, NP  albuterol  (VENTOLIN  HFA) 108 (90 Base) MCG/ACT inhaler Inhale 2 puffs into the lungs every 6 (six) hours as needed for wheezing or shortness of breath. 09/09/23   Arlander Charleston, MD  alendronate  (FOSAMAX ) 70 MG tablet Take 1 tablet (70 mg total) by mouth every 7 (seven) days. Take with a full glass of water  on an empty stomach. 12/21/23   Marylynn Verneita CROME, MD  amoxicillin -clavulanate (AUGMENTIN ) 875-125 MG tablet Take 1 tablet by mouth 2 (  two) times daily. 07/26/24   Tullo, Teresa L, MD  Calcium Carbonate-Vitamin D  (CALTRATE 600+D PO) Take 2 tablets by mouth daily.     [provider]  Cyanocobalamin (VITAMIN B-12 PO) Take 2,500 mcg by mouth daily.     [provider]  levothyroxine  (SYNTHROID ) 100 MCG tablet TAKE 1 TABLET DAILY BEFORE BREAKFAST 08/04/24   Tullo, Teresa L, MD  Misc Natural Products (OSTEO BI-FLEX JOINT SHIELD PO) Take 1 tablet by mouth 2 (two) times daily.     [provider]  Multiple Vitamins-Minerals (CENTRUM SILVER PO) Take 1 tablet by mouth daily.     [provider]  Multiple Vitamins-Minerals (PRESERVISION AREDS 2+MULTI VIT PO)  Take 1 capsule by mouth 2 (two) times daily.    [provider]  predniSONE  (DELTASONE ) 10 MG tablet 6 tablets on Day 1 , then reduce by 1 tablet daily until gone 07/26/24   Marylynn Verneita CROME, MD  traZODone  (DESYREL ) 50 MG tablet TAKE 1 TABLET AT BEDTIME ASNEEDED FOR SLEEP 06/20/24   Marylynn Verneita CROME, MD    Family History Family History  Problem Relation Age of Onset   Hypertension Mother    Hypertension Sister    Mental illness Sister    Dementia Sister    Diabetes Brother    Cancer Brother        Kidney/Bladder   Dementia Father    Parkinson's disease Brother    Breast cancer Neg Hx     Social History Social History   Tobacco Use   Smoking status: Never   Smokeless tobacco: Never  Vaping Use   Vaping status: Never Used  Substance Use Topics   Alcohol use: No   Drug use: No     Allergies   Amoxicillin -pot clavulanate, Etodolac, Morphine and codeine, Oxycontin [oxycodone hcl], Cephalexin, Metronidazole, and Sulfa antibiotics   Review of Systems Review of Systems   Physical Exam Triage Vital Signs ED Triage Vitals  Encounter Vitals Group     BP 09/30/24 0811 128/84     Girls Systolic BP Percentile --      Girls Diastolic BP Percentile --      Boys Systolic BP Percentile --      Boys Diastolic BP Percentile --      Pulse Rate 09/30/24 0811 76     Resp 09/30/24 0811 18     Temp 09/30/24 0811 98.6 F (37 C)     Temp Source 09/30/24 0811 Oral     SpO2 09/30/24 0811 95 %     Weight --      Height --      Head Circumference --      Peak Flow --      Pain Score 09/30/24 0814 6     Pain Loc --      Pain Education --      Exclude from Growth Chart --    No data found.  Updated Vital Signs BP 128/84 (BP Location: Left Arm)   Pulse 76   Temp 98.6 F (37 C) (Oral)   Resp 18   SpO2 95%   Visual Acuity Right Eye Distance:   Left Eye Distance:   Bilateral Distance:    Right Eye Near:   Left Eye Near:    Bilateral Near:     Physical  Exam Constitutional:      Appearance: Normal appearance.  HENT:     Head: Normocephalic.     Right Ear: Tympanic membrane, ear canal and  external ear normal.     Left Ear: Tympanic membrane, ear canal and external ear normal.     Nose: Congestion present.     Mouth/Throat:     Pharynx: No oropharyngeal exudate or posterior oropharyngeal erythema.  Eyes:     Extraocular Movements: Extraocular movements intact.  Cardiovascular:     Rate and Rhythm: Normal rate and regular rhythm.     Pulses: Normal pulses.     Heart sounds: Normal heart sounds.  Pulmonary:     Effort: Pulmonary effort is normal.     Breath sounds: Normal breath sounds.  Musculoskeletal:     Cervical back: Normal range of motion and neck supple.  Neurological:     Mental Status: She is alert and oriented to person, place, and time. Mental status is at baseline.      UC Treatments / Results  Labs (all labs ordered are listed, but only abnormal results are displayed) Labs Reviewed  POC COVID19/FLU A&B COMBO - Abnormal; Notable for the following components:      Result Value   Influenza A Antigen, POC Positive (*)    All other components within normal limits    EKG   Radiology No results found.  Procedures Procedures (including critical care time)  Medications Ordered in UC Medications - No data to display  Initial Impression / Assessment and Plan / UC Course  I have reviewed the triage vital signs and the nursing notes.  Pertinent labs & imaging results that were available during my care of the patient were reviewed by me and considered in my medical decision making (see chart for details).  Influenza A, viral illness  Patient is in no signs of distress nor toxic appearing.  Vital signs are stable.  Low suspicion for pneumonia, pneumothorax or bronchitis and therefore will defer imaging.  COVID test negative.  Prescribed Tamiflu and discussed quarantine if with fever. Prescription cough medicine.  May use additional over-the-counter medications as needed for supportive care.  May follow-up with urgent care as needed if symptoms persist or worsen.   Final Clinical Impressions(s) / UC Diagnoses   Final diagnoses:  Viral illness  Influenza A     Discharge Instructions      Influenza A is a virus and should steadily improve in time it can take up to 7 to 10 days before you truly start to see a turnaround however things will get better  Begin Tamiflu every morning and every evening for 5 days to reduce the amount of virus in the body which helps to minimize symptoms    You can take Tylenol  and/or Ibuprofen as needed for fever reduction and pain relief.   For cough: honey 1/2 to 1 teaspoon (you can dilute the honey in water  or another fluid).  You can also use guaifenesin and dextromethorphan for cough. You can use a humidifier for chest congestion and cough.  If you don't have a humidifier, you can sit in the bathroom with the hot shower running.      For sore throat: try warm salt water  gargles, cepacol lozenges, throat spray, warm tea or water  with lemon/honey, popsicles or ice, or OTC cold relief medicine for throat discomfort.   For congestion: take a daily anti-histamine like Zyrtec , Claritin, and a oral decongestant, such as pseudoephedrine.  You can also use Flonase  1-2 sprays in each nostril daily.   It is important to stay hydrated: drink plenty of fluids (water , gatorade/powerade/pedialyte, juices, or teas) to keep your throat  moisturized and help further relieve irritation/discomfort.    ED Prescriptions     Medication Sig Dispense Auth. Provider   oseltamivir (TAMIFLU) 75 MG capsule Take 1 capsule (75 mg total) by mouth every 12 (twelve) hours. 10 capsule Macyn Remmert R, NP      PDMP not reviewed this encounter.   Teresa Shelba SAUNDERS, [place] 09/30/24 [phone]

## 2024-11-03 ENCOUNTER — Other Ambulatory Visit: Payer: Self-pay | Admitting: Internal Medicine

## 2024-12-24 ENCOUNTER — Ambulatory Visit

## 2024-12-24 ENCOUNTER — Ambulatory Visit: Admitting: Internal Medicine

## 2024-12-24 ENCOUNTER — Encounter: Payer: Self-pay | Admitting: Internal Medicine

## 2024-12-24 VITALS — BP 124/78 | HR 66 | Ht 67.0 in | Wt 145.8 lb

## 2024-12-24 DIAGNOSIS — E039 Hypothyroidism, unspecified: Secondary | ICD-10-CM | POA: Diagnosis not present

## 2024-12-24 DIAGNOSIS — I428 Other cardiomyopathies: Secondary | ICD-10-CM

## 2024-12-24 DIAGNOSIS — I361 Nonrheumatic tricuspid (valve) insufficiency: Secondary | ICD-10-CM

## 2024-12-24 DIAGNOSIS — M810 Age-related osteoporosis without current pathological fracture: Secondary | ICD-10-CM

## 2024-12-24 DIAGNOSIS — Z8639 Personal history of other endocrine, nutritional and metabolic disease: Secondary | ICD-10-CM

## 2024-12-24 DIAGNOSIS — E559 Vitamin D deficiency, unspecified: Secondary | ICD-10-CM

## 2024-12-24 DIAGNOSIS — R002 Palpitations: Secondary | ICD-10-CM | POA: Diagnosis not present

## 2024-12-24 LAB — COMPREHENSIVE METABOLIC PANEL WITH GFR
ALT: 15 U/L (ref 3–35)
AST: 18 U/L (ref 5–37)
Albumin: 4.2 g/dL (ref 3.5–5.2)
Alkaline Phosphatase: 40 U/L (ref 39–117)
BUN: 13 mg/dL (ref 6–23)
CO2: 29 meq/L (ref 19–32)
Calcium: 9.6 mg/dL (ref 8.4–10.5)
Chloride: 103 meq/L (ref 96–112)
Creatinine, Ser: 0.89 mg/dL (ref 0.40–1.20)
GFR: 58.71 mL/min — ABNORMAL LOW
Glucose, Bld: 93 mg/dL (ref 70–99)
Potassium: 4.5 meq/L (ref 3.5–5.1)
Sodium: 140 meq/L (ref 135–145)
Total Bilirubin: 0.5 mg/dL (ref 0.2–1.2)
Total Protein: 6.2 g/dL (ref 6.0–8.3)

## 2024-12-24 LAB — VITAMIN D 25 HYDROXY (VIT D DEFICIENCY, FRACTURES): VITD: 83.34 ng/mL (ref 30.00–100.00)

## 2024-12-24 NOTE — Progress Notes (Signed)
 "  Subjective:  Patient ID: Lisa Petersen, female    DOB: [birth year]  Age: 89 y.o. MRN: [id number]  CC: The primary encounter diagnosis was Palpitations. Diagnoses of Age-related osteoporosis without current pathological fracture, History of Graves' disease, Hypothyroidism, unspecified type, Vitamin D  deficiency, Other cardiomyopathy (HCC), Nonrheumatic tricuspid valve regurgitation, and Intermittent palpitations were also pertinent to this visit.   HPI Lisa Petersen presents for  Chief Complaint  Patient presents with   Medical Management of Chronic Issues    6 month follow up    1) PALPITATIONS .  She notes recurrent episodes of palpitations accompanied by a feeling of f generalized weakness occurring  in the mornings before eating breakfast. The symptoms occur with activity and resolve spontaneously with rest .and occur only in the morning.  There  is no history of chest pain , nausea or shortness of breath .  The symptoms do not occur daily  but several times per week  2)  Osteopenia; patient has been taking alendronate  since January 2025  without side effects.  She has been physically active    Outpatient Medications Prior to Visit  Medication Sig Dispense Refill   albuterol  (VENTOLIN  HFA) 108 (90 Base) MCG/ACT inhaler Inhale 2 puffs into the lungs every 6 (six) hours as needed for wheezing or shortness of breath. 8 g 2   alendronate  (FOSAMAX ) 70 MG tablet TAKE 1 TABLET EVERY 7 DAYS,WITH A FULL GLASS OF WATER  ON AN EMPTY STOMACH 12 tablet 3   Calcium Carbonate-Vitamin D  (CALTRATE 600+D PO) Take 2 tablets by mouth daily.      Cyanocobalamin (VITAMIN B-12 PO) Take 2,500 mcg by mouth daily.      levothyroxine  (SYNTHROID ) 100 MCG tablet TAKE 1 TABLET DAILY BEFORE BREAKFAST 90 tablet 3   Misc Natural Products (OSTEO BI-FLEX JOINT SHIELD PO) Take 1 tablet by mouth 2 (two) times daily.      Multiple Vitamins-Minerals (CENTRUM SILVER PO) Take 1 tablet by mouth daily.      Multiple  Vitamins-Minerals (PRESERVISION AREDS 2+MULTI VIT PO) Take 1 capsule by mouth 2 (two) times daily.     traZODone  (DESYREL ) 50 MG tablet TAKE 1 TABLET AT BEDTIME ASNEEDED FOR SLEEP 90 tablet 3   amoxicillin -clavulanate (AUGMENTIN ) 875-125 MG tablet Take 1 tablet by mouth 2 (two) times daily. (Patient not taking: Reported on 12/24/2024) 14 tablet 0   oseltamivir  (TAMIFLU ) 75 MG capsule Take 1 capsule (75 mg total) by mouth every 12 (twelve) hours. (Patient not taking: Reported on 12/24/2024) 10 capsule 0   predniSONE  (DELTASONE ) 10 MG tablet 6 tablets on Day 1 , then reduce by 1 tablet daily until gone 21 tablet 0   No facility-administered medications prior to visit.    Review of Systems;  Patient denies headache, fevers, malaise, unintentional weight loss, skin rash, eye pain, sinus congestion and sinus pain, sore throat, dysphagia,  hemoptysis , cough, dyspnea, wheezing, chest pain, palpitations, orthopnea, edema, abdominal pain, nausea, melena, diarrhea, constipation, flank pain, dysuria, hematuria, urinary  Frequency, nocturia, numbness, tingling, seizures,  Focal weakness, Loss of consciousness,  Tremor, insomnia, depression, anxiety, and suicidal ideation.      Objective:  BP 124/78   Pulse 66   Ht 5' 7 (1.702 m)   Wt 145 lb 12.8 oz (66.1 kg)   SpO2 99%   BMI 22.84 kg/m   BP Readings from Last 3 Encounters:  12/24/24 124/78  09/30/24 128/84  07/26/24 122/76    Wt Readings from Last 3 Encounters:  12/24/24 145 lb 12.8 oz (66.1 kg)  07/26/24 146 lb 7 oz (66.4 kg)  07/17/24 145 lb (65.8 kg)    Physical Exam Vitals reviewed.  Constitutional:      General: She is not in acute distress.    Appearance: Normal appearance. She is normal weight. She is not ill-appearing, toxic-appearing or diaphoretic.  HENT:     Head: Normocephalic.  Eyes:     General: No scleral icterus.       Right eye: No discharge.        Left eye: No discharge.     Conjunctiva/sclera: Conjunctivae  normal.  Cardiovascular:     Rate and Rhythm: Normal rate and regular rhythm.     Heart sounds: Normal heart sounds.  Pulmonary:     Effort: Pulmonary effort is normal. No respiratory distress.     Breath sounds: Normal breath sounds.  Musculoskeletal:        General: Normal range of motion.  Skin:    General: Skin is warm and dry.  Neurological:     General: No focal deficit present.     Mental Status: She is alert and oriented to person, place, and time. Mental status is at baseline.  Psychiatric:        Mood and Affect: Mood normal.        Behavior: Behavior normal.        Thought Content: Thought content normal.        Judgment: Judgment normal.     Lab Results  Component Value Date   HGBA1C 5.7 06/20/2024    Lab Results  Component Value Date   CREATININE 0.89 12/24/2024   CREATININE 0.96 06/20/2024   CREATININE 0.84 12/21/2023    Lab Results  Component Value Date   WBC 5.6 11/14/2023   HGB 13.3 11/14/2023   HCT 40.1 11/14/2023   PLT 222.0 11/14/2023   GLUCOSE 93 12/24/2024   CHOL 202 (H) 06/20/2024   TRIG 128.0 06/20/2024   HDL 51.60 06/20/2024   LDLDIRECT 125.0 06/20/2024   LDLCALC 125 (H) 06/20/2024   ALT 15 12/24/2024   AST 18 12/24/2024   NA 140 12/24/2024   K 4.5 12/24/2024   CL 103 12/24/2024   CREATININE 0.89 12/24/2024   BUN 13 12/24/2024   CO2 29 12/24/2024   TSH 1.19 06/20/2024   INR 0.87 03/26/2011   HGBA1C 5.7 06/20/2024    No results found.  Assessment & Plan:  .Palpitations Assessment & Plan: Occurring in the morning , lasting several minutes  accompanied by feeling weak . SABRA Zio monitor ordered   Orders: -     LONG TERM MONITOR (3-14 DAYS); Future  Age-related osteoporosis without current pathological fracture Assessment & Plan: She has been tolerating  ALENDRONATE  since JANUARY 2025  with chest pain .  No history of fractures.  Taking calcium . Has not taken vitamin D  since 2024 when level was high .  Repeat  DEXA ordered     History of Graves' disease Assessment & Plan: TREATED WITH irradiation   Orders: -     Thyroid  Panel With TSH  Hypothyroidism, unspecified type  Vitamin D  deficiency -     Comprehensive metabolic panel with GFR -     VITAMIN D  25 Hydroxy (Vit-D Deficiency, Fractures)  Other cardiomyopathy (HCC) Assessment & Plan: Tricuspid regurgitation , moderate, by 2024 ECHO with normal LVEF    Nonrheumatic tricuspid valve regurgitation Assessment & Plan: By 2024 ECHO.. she  noted no difference with trial  of every other day furosemide  and was advised to dc medication    Intermittent palpitations Assessment & Plan: Occurring in the morning , lasting several minutes  accompanied by feeling weak . SABRA Zio monitor ordered      Follow-up: No follow-ups on file.   Verneita LITTIE [place], MD "

## 2024-12-24 NOTE — Assessment & Plan Note (Signed)
 Tricuspid regurgitation , moderate, by 2024 ECHO with normal LVEF

## 2024-12-24 NOTE — Assessment & Plan Note (Addendum)
 She has been tolerating  ALENDRONATE  since JANUARY 2025  with chest pain .  No history of fractures.  Taking calcium . Has not taken vitamin D  since 2024 when level was high .  Repeat  DEXA ordered

## 2024-12-24 NOTE — Assessment & Plan Note (Signed)
 Occurring in the morning , lasting several minutes  accompanied by feeling weak . Lisa Petersen monitor ordered

## 2024-12-24 NOTE — Assessment & Plan Note (Signed)
 By 2024 ECHO.. she  noted no difference with trial of every other day furosemide  and was advised to dc medication

## 2024-12-24 NOTE — Patient Instructions (Signed)
 Your morning episodes may be due to an irregular heart beat pattern.  If this pattern is atrial fibrillation,  it needs treatment to reduce your risk of stroke.    I am ordering a ZIO monitor for you to wear for 2 weeks to monitor your heart rhythms .  It will come to your house and you can apply it to your chest wall.  If you have any trouble applying it you can schedule a RN visit in our office and we will help you   Your bone density test  been ordered.  Please call to make your appointment at [facility]  [phone]

## 2024-12-24 NOTE — Assessment & Plan Note (Signed)
 TREATED WITH irradiation

## 2024-12-25 LAB — THYROID PANEL WITH TSH
Free Thyroxine Index: 3 (ref 1.4–3.8)
T3 Uptake: 36 % — ABNORMAL HIGH (ref 22–35)
T4, Total: 8.4 ug/dL (ref 5.1–11.9)
TSH: 1.1 m[IU]/L (ref 0.40–4.50)

## 2024-12-26 ENCOUNTER — Ambulatory Visit: Payer: Self-pay | Admitting: Internal Medicine

## 2024-12-26 DIAGNOSIS — E039 Hypothyroidism, unspecified: Secondary | ICD-10-CM

## 2024-12-26 MED ORDER — LEVOTHYROXINE SODIUM 100 MCG PO TABS: 100.0000 ug | ORAL_TABLET | Freq: Every day | ORAL | 3 refills | Status: AC

## 2024-12-28 ENCOUNTER — Ambulatory Visit

## 2024-12-28 ENCOUNTER — Ambulatory Visit: Payer: Self-pay

## 2024-12-28 NOTE — Telephone Encounter (Signed)
" °  Reason for Triage: Unsure of how to put machine Zio on was advised by Dr. Marylynn that a nurse would help with it.  & swelling/soreness under arm that's gotten worse  Reason for Disposition  [1] Small swelling or lump AND [2] unexplained AND [3] present > 1 week  Answer Assessment - Initial Assessment Questions Pt states Dr. Marylynn told her if she didn't feel comfortable putting the zio monitor on, someone in the office would do it for her.   She also mentioned a spot under her left arm that is swollen and sore. She states it just looks like a big wad of fat. When trying to get more information, she said she'd rather the nurse just look at it when she comes in for the monitor to tell her if its something she should worry about. Still attempted to get additional information and she said, it's just fatter under one arm than the other. States she has done breast exams and no changes there. Denies that it's red. Asked if it hurts when touching it, she said not necessarily.       1. APPEARANCE of SWELLING: What does it look like?     Wad of fat 2. SIZE: How large is the swelling? (e.g., inches, cm; or compare to size of pinhead, tip of pen, eraser, coin, pea, grape, ping pong ball)      About 3 fingers but not as wide or thick as fingers would be 3. LOCATION: Where is the swelling located?     In left armpit 4. ONSET: When did the swelling start?     A while 5. COLOR: What color is it? Is there more than one color?     Denies any change in color 6. PAIN: Is there any pain? If Yes, ask: How bad is the pain? (Scale 1-10; or mild, moderate, severe)       No pain just sore some times. Rated it a3-4 7. ITCH: Does it itch? If Yes, ask: How bad is the itch?       8. CAUSE: What do you think caused the swelling?     unknown 9 OTHER SYMPTOMS: Do you have any other symptoms? (e.g., fever)     denies  Protocols used: Skin Lump or Localized Swelling-A-AH  "

## 2024-12-28 NOTE — Telephone Encounter (Signed)
 FYI Only or Action Required?: FYI only for provider: appointment scheduled on 1.23.26 Nurse visit for monitor.  Patient was last seen in primary care on 12/24/2024 by Marylynn Verneita CROME, MD.  Called Nurse Triage reporting [place].  Symptoms began several days ago.  Interventions attempted: Nothing.  Symptoms are: unchanged.  Triage Disposition: See PCP When Office is Open (Within 3 Days)  Patient/caregiver understands and will follow disposition?: No, wishes to speak with PCP

## 2024-12-28 NOTE — Progress Notes (Signed)
 Pt presented in office to have ZIO monitor placed and explained to pt how it works pt verbalized understanding

## 2025-01-17 ENCOUNTER — Other Ambulatory Visit

## 2025-04-23 ENCOUNTER — Ambulatory Visit

## 2026-07-18 ENCOUNTER — Ambulatory Visit: Admitting: Urology
